# Patient Record
Sex: Female | Born: 1935
Health system: Southern US, Community
[De-identification: ages and names within clinical notes are randomized; demographics above are authoritative.]

## PROBLEM LIST (undated history)

## (undated) DIAGNOSIS — K589 Irritable bowel syndrome without diarrhea: Secondary | ICD-10-CM

## (undated) DIAGNOSIS — E785 Hyperlipidemia, unspecified: Secondary | ICD-10-CM

## (undated) DIAGNOSIS — F419 Anxiety disorder, unspecified: Secondary | ICD-10-CM

## (undated) HISTORY — PX: CHOLECYSTECTOMY: SHX55

## (undated) HISTORY — PX: ABDOMINAL HYSTERECTOMY: SHX81

## (undated) HISTORY — PX: APPENDECTOMY: SHX54

## (undated) HISTORY — DX: Anxiety disorder, unspecified: F41.9

## (undated) HISTORY — DX: Irritable bowel syndrome, unspecified: K58.9

## (undated) HISTORY — DX: Hyperlipidemia, unspecified: E78.5

---

## 2011-06-13 ENCOUNTER — Ambulatory Visit: Payer: Self-pay | Admitting: Family Medicine

## 2012-04-19 ENCOUNTER — Emergency Department: Payer: Self-pay | Admitting: Emergency Medicine

## 2012-04-19 LAB — URINALYSIS, COMPLETE
Bilirubin,UR: NEGATIVE
Ketone: NEGATIVE
Nitrite: NEGATIVE
Ph: 6 (ref 4.5–8.0)
Protein: NEGATIVE
RBC,UR: 3 /HPF (ref 0–5)
Specific Gravity: 1.006 (ref 1.003–1.030)
Squamous Epithelial: NONE SEEN
WBC UR: <1 /HPF (ref 0–5)

## 2012-04-19 LAB — CBC
HCT: 41.9 % (ref 35.0–47.0)
HGB: 14.3 g/dL (ref 12.0–16.0)
MCH: 29.6 pg (ref 26.0–34.0)
Platelet: 144 10*3/uL — ABNORMAL LOW (ref 150–440)
RBC: 4.82 10*6/uL (ref 3.80–5.20)
RDW: 14 % (ref 11.5–14.5)

## 2012-04-19 LAB — COMPREHENSIVE METABOLIC PANEL
Albumin: 4.4 g/dL (ref 3.4–5.0)
Alkaline Phosphatase: 92 U/L (ref 50–136)
Anion Gap: 9 (ref 7–16)
Bilirubin,Total: 1 mg/dL (ref 0.2–1.0)
Calcium, Total: 9.5 mg/dL (ref 8.5–10.1)
Chloride: 107 mmol/L (ref 98–107)
Creatinine: 0.91 mg/dL (ref 0.60–1.30)
EGFR (Non-African Amer.): >60
SGPT (ALT): 46 U/L
Sodium: 143 mmol/L (ref 136–145)

## 2012-04-19 LAB — CK TOTAL AND CKMB (NOT AT ARMC): CK, Total: 54 U/L (ref 21–215)

## 2012-04-19 LAB — TROPONIN I: Troponin-I: <0.02 ng/mL

## 2012-05-17 ENCOUNTER — Ambulatory Visit: Payer: Self-pay | Admitting: Otolaryngology

## 2012-07-22 DIAGNOSIS — R42 Dizziness and giddiness: Secondary | ICD-10-CM | POA: Insufficient documentation

## 2012-07-22 DIAGNOSIS — K589 Irritable bowel syndrome without diarrhea: Secondary | ICD-10-CM | POA: Insufficient documentation

## 2012-07-22 DIAGNOSIS — F339 Major depressive disorder, recurrent, unspecified: Secondary | ICD-10-CM | POA: Insufficient documentation

## 2012-07-22 DIAGNOSIS — F32A Depression, unspecified: Secondary | ICD-10-CM | POA: Insufficient documentation

## 2012-07-22 DIAGNOSIS — E785 Hyperlipidemia, unspecified: Secondary | ICD-10-CM | POA: Insufficient documentation

## 2012-07-22 DIAGNOSIS — F419 Anxiety disorder, unspecified: Secondary | ICD-10-CM | POA: Insufficient documentation

## 2012-08-13 ENCOUNTER — Ambulatory Visit: Payer: Self-pay | Admitting: Family Medicine

## 2013-11-28 IMAGING — US US CAROTID DUPLEX BILAT
1 series · 14 of 24 positions shown · non-contrast
Comparison: none

REASON FOR EXAM: dizziness
COMMENTS:

[Series 1: us carotid duplex bilat · 14 of 57 slices shown]
[im 1/57]
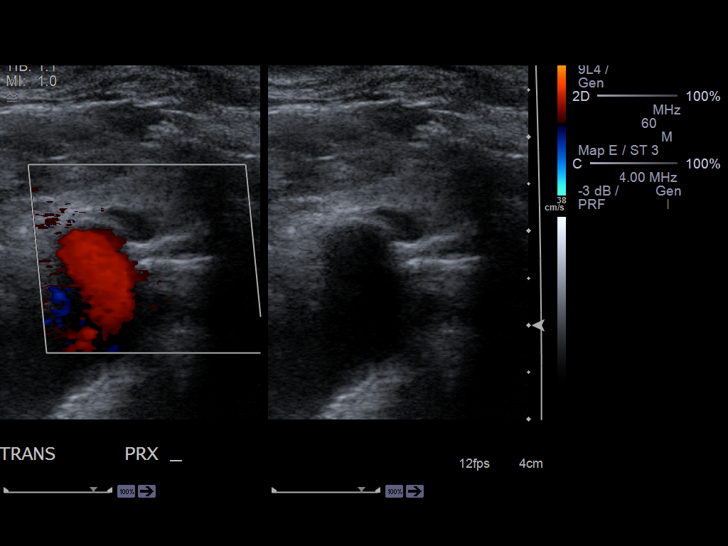
[im 5/57]
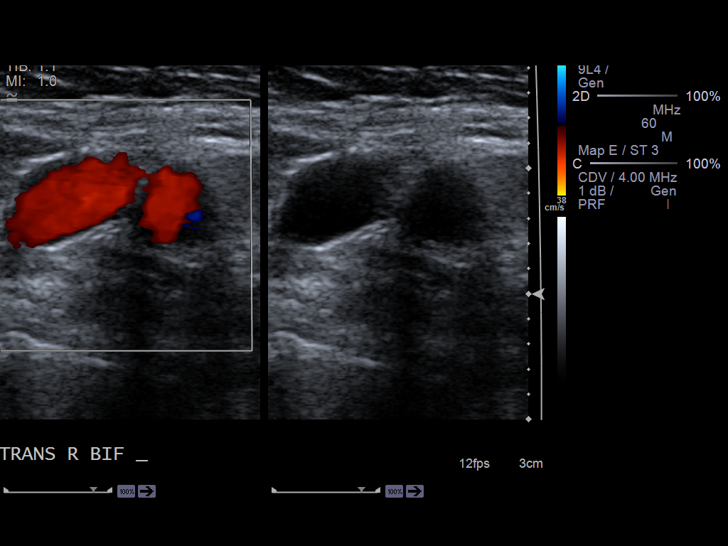
[im 10/57]
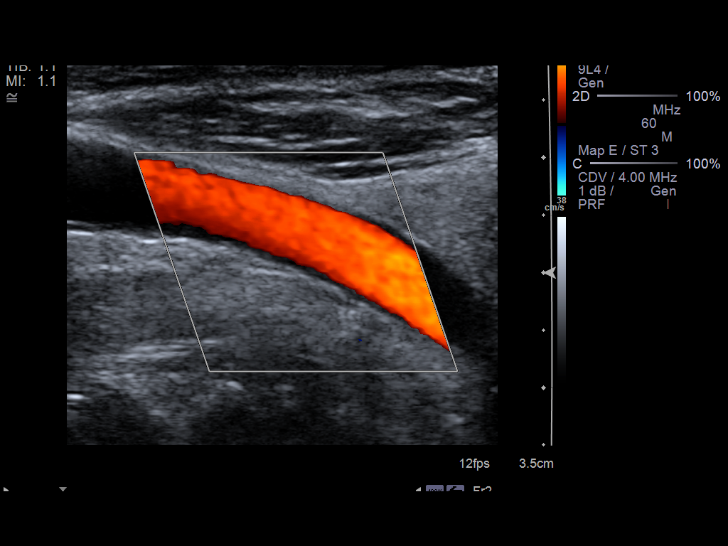
[im 15/57]
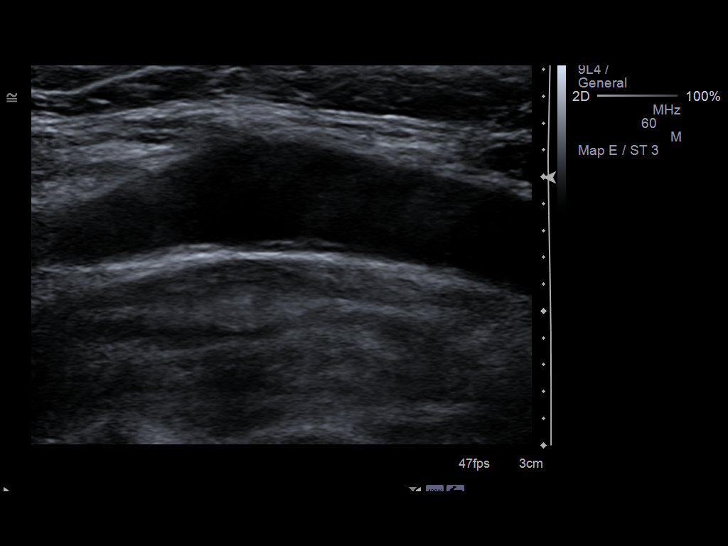
[im 18/57]
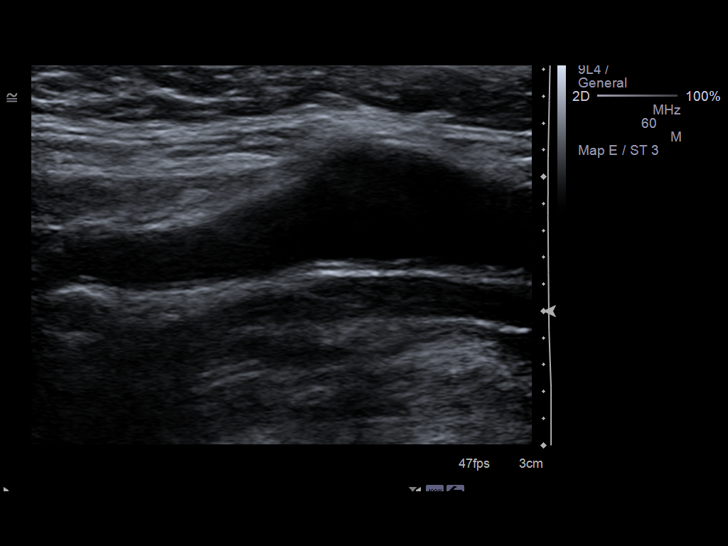
[im 22/57]
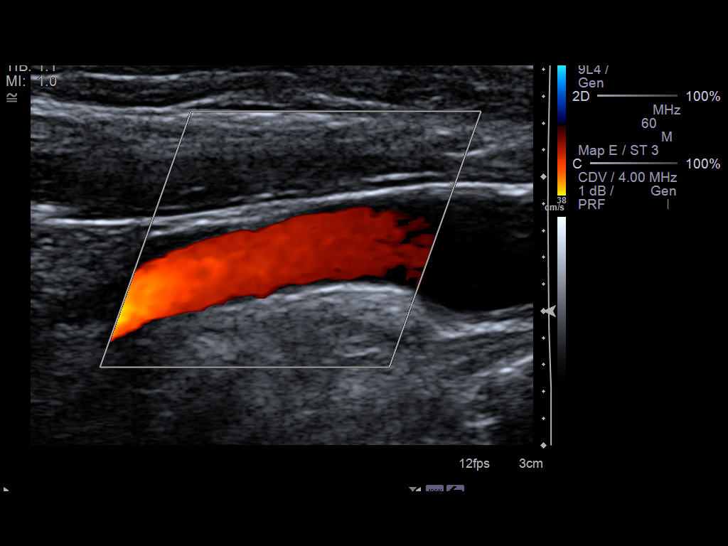
[im 27/57]
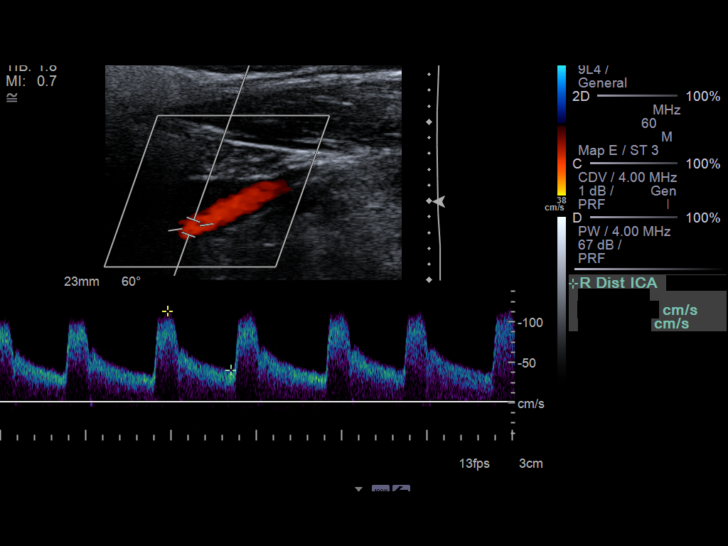
[im 30/57]
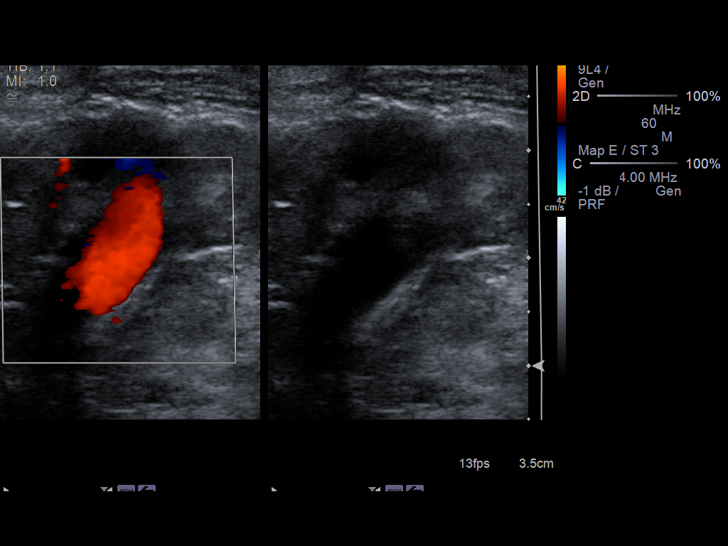
[im 35/57]
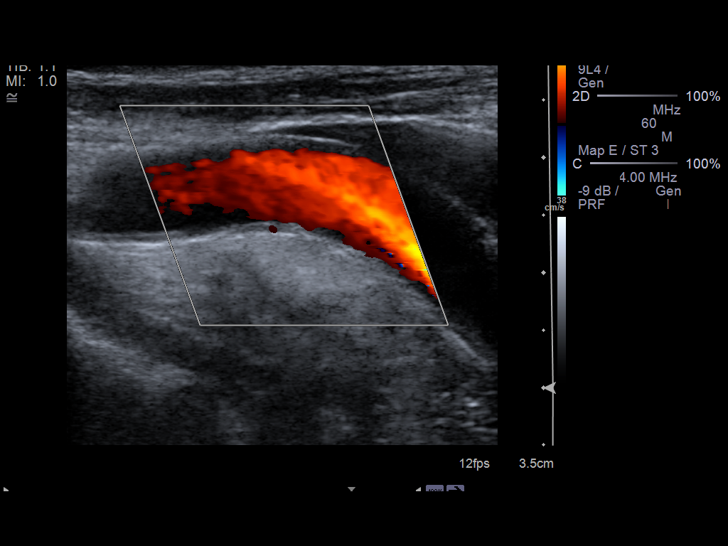
[im 39/57]
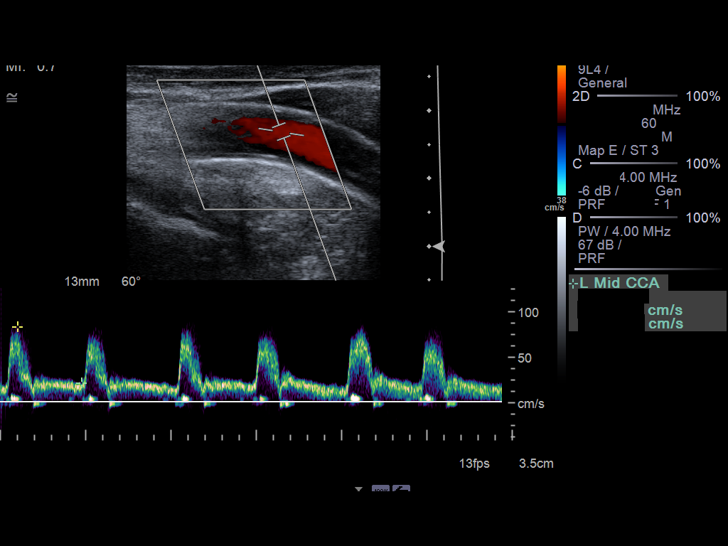
[im 44/57]
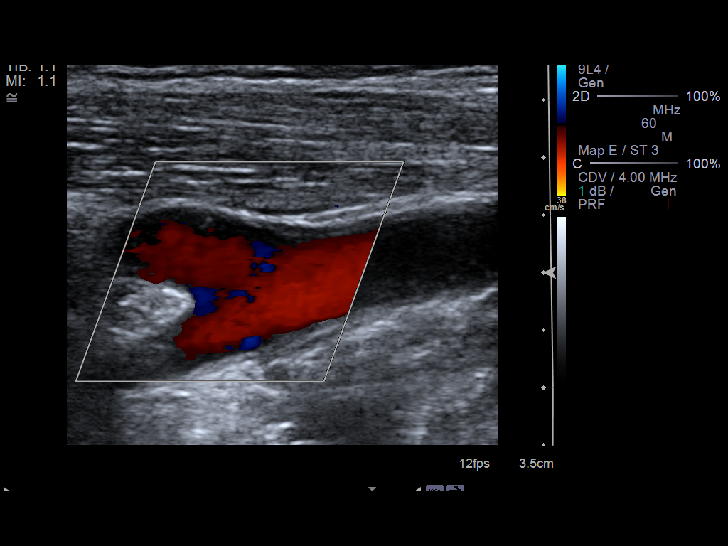
[im 47/57]
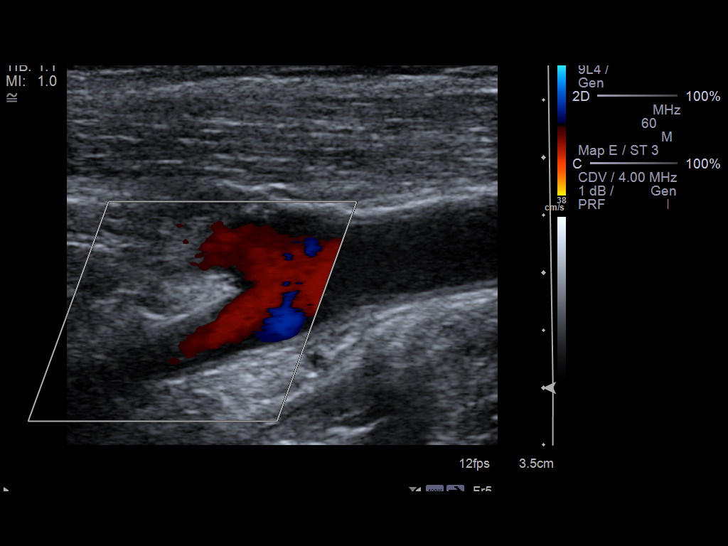
[im 52/57]
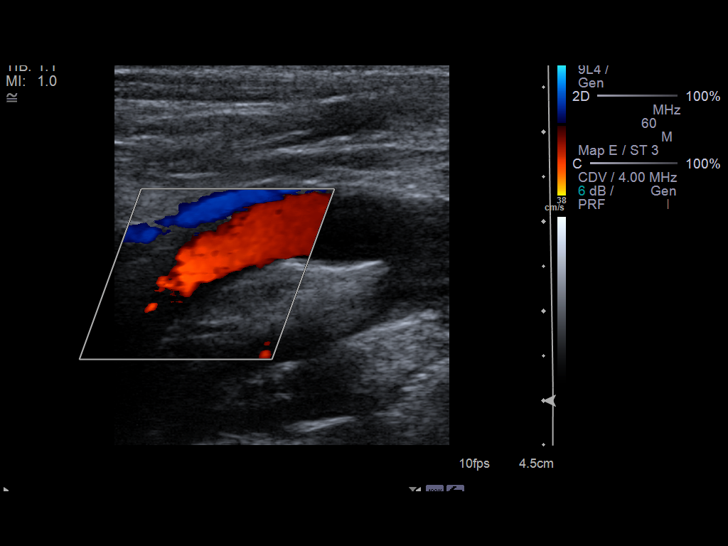
[im 57/57]
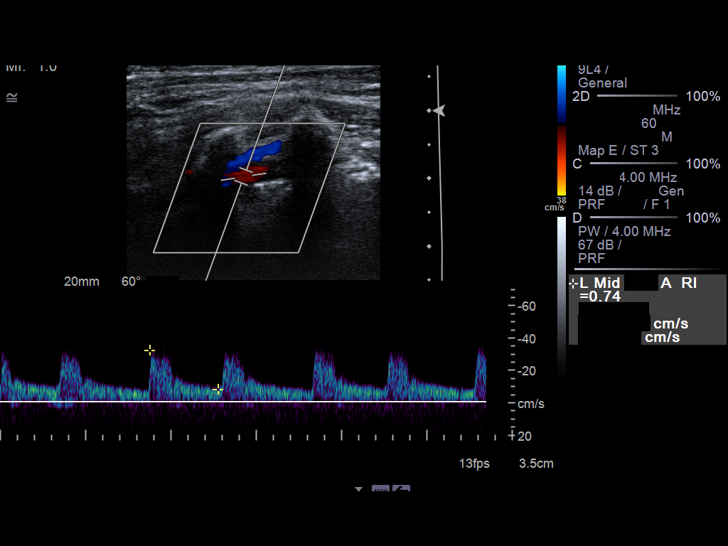

[14 of 24 positions shown; findings below may reference images not displayed]

PROCEDURE:     US  - US CAROTID DOPPLER BILATERAL  - May 17, 2012 [DATE]

RESULT:     No definite plaque formation is seen on either side.

On the right, the peak right common carotid artery flow velocity measures
92.1 c/sec and the peak right internal carotid artery flow velocity measures
113.3 c/sec. The ICA/CCA ratio is 1.23.

On the left, the peak left common carotid artery flow velocity measures
c/sec and the peak left internal carotid artery flow velocity also measures
99.2 c/sec. The ICA/CCA ratio is 1.

These values are compatible with the bilateral absence of hemodynamically
significant stenosis.

There is observed antegrade flow in both vertebrals.
IMPRESSION: 1.  Normal study. No plaque formation or stenosis is identified on either
side.
2.  There is antegrade flow in both vertebrals.

[REDACTED]

## 2014-12-20 DIAGNOSIS — K279 Peptic ulcer, site unspecified, unspecified as acute or chronic, without hemorrhage or perforation: Secondary | ICD-10-CM | POA: Diagnosis not present

## 2014-12-20 DIAGNOSIS — N289 Disorder of kidney and ureter, unspecified: Secondary | ICD-10-CM | POA: Diagnosis not present

## 2014-12-20 DIAGNOSIS — K219 Gastro-esophageal reflux disease without esophagitis: Secondary | ICD-10-CM | POA: Diagnosis not present

## 2014-12-20 DIAGNOSIS — F329 Major depressive disorder, single episode, unspecified: Secondary | ICD-10-CM | POA: Diagnosis not present

## 2014-12-20 DIAGNOSIS — E780 Pure hypercholesterolemia: Secondary | ICD-10-CM | POA: Diagnosis not present

## 2015-03-20 DIAGNOSIS — K219 Gastro-esophageal reflux disease without esophagitis: Secondary | ICD-10-CM | POA: Diagnosis not present

## 2015-03-20 DIAGNOSIS — N3281 Overactive bladder: Secondary | ICD-10-CM | POA: Diagnosis not present

## 2015-03-20 DIAGNOSIS — E780 Pure hypercholesterolemia: Secondary | ICD-10-CM | POA: Diagnosis not present

## 2015-03-20 DIAGNOSIS — K279 Peptic ulcer, site unspecified, unspecified as acute or chronic, without hemorrhage or perforation: Secondary | ICD-10-CM | POA: Diagnosis not present

## 2015-03-20 DIAGNOSIS — F329 Major depressive disorder, single episode, unspecified: Secondary | ICD-10-CM | POA: Diagnosis not present

## 2015-05-11 DIAGNOSIS — F329 Major depressive disorder, single episode, unspecified: Secondary | ICD-10-CM | POA: Diagnosis not present

## 2015-05-11 DIAGNOSIS — E780 Pure hypercholesterolemia: Secondary | ICD-10-CM | POA: Diagnosis not present

## 2015-05-11 DIAGNOSIS — F5104 Psychophysiologic insomnia: Secondary | ICD-10-CM | POA: Diagnosis not present

## 2015-05-11 DIAGNOSIS — G5791 Unspecified mononeuropathy of right lower limb: Secondary | ICD-10-CM | POA: Diagnosis not present

## 2015-05-11 DIAGNOSIS — G5792 Unspecified mononeuropathy of left lower limb: Secondary | ICD-10-CM | POA: Diagnosis not present

## 2015-05-21 ENCOUNTER — Other Ambulatory Visit: Payer: Self-pay | Admitting: Family Medicine

## 2015-05-21 DIAGNOSIS — K589 Irritable bowel syndrome without diarrhea: Secondary | ICD-10-CM

## 2015-05-21 MED ORDER — LOPERAMIDE HCL 2 MG PO TABS: 2.0000 mg | ORAL_TABLET | Freq: Three times a day (TID) | ORAL | Status: DC | PRN

## 2015-05-29 ENCOUNTER — Telehealth: Payer: Self-pay | Admitting: Family Medicine

## 2015-05-29 NOTE — Telephone Encounter (Signed)
Marsa Aris called to ask if the LOPERAMIDE 10 MG that was prescribed as tablet can be processed as capsule form.  Ref # [id number]

## 2015-05-29 NOTE — Telephone Encounter (Signed)
Can you research if I need to send this or not.[facility]

## 2015-05-29 NOTE — Telephone Encounter (Signed)
I am not aware that this is available by capsule.  If it is found that it is (she may ask pharmacy), then she may have i that way.

## 2015-05-30 NOTE — Telephone Encounter (Signed)
Advised on detailed message to call and have pharmacist look into changing form from Tab to Cap. [facility]

## 2015-05-31 DIAGNOSIS — K589 Irritable bowel syndrome without diarrhea: Secondary | ICD-10-CM

## 2015-05-31 MED ORDER — LOPERAMIDE HCL 2 MG PO TABS: 2.0000 mg | ORAL_TABLET | Freq: Three times a day (TID) | ORAL | Status: DC | PRN

## 2015-05-31 NOTE — Telephone Encounter (Signed)
This encounter was created in error - please disregard.

## 2015-07-17 ENCOUNTER — Other Ambulatory Visit: Payer: Self-pay | Admitting: Family Medicine

## 2015-07-19 ENCOUNTER — Ambulatory Visit: Payer: Self-pay | Admitting: Family Medicine

## 2015-07-23 ENCOUNTER — Telehealth: Payer: Self-pay | Admitting: Family Medicine

## 2015-07-23 NOTE — Telephone Encounter (Signed)
Pt called regarding her anxiety attack and taken clonazepam at 3:45 pm wanted to know when she can take next one and what to do with her Anxiety attack if she can't take another pill now and as per Dr. Juanetta Gosling advised pt that she can have next one in 6-8 hours and schedule appointment with Dr. Juanetta Gosling sooner but if she is having really bad anxiety attack then she needed to go to ER explain pt and she understand well and her plan is to go to ER.

## 2015-07-23 NOTE — Telephone Encounter (Signed)
Agreed-jh

## 2015-07-27 ENCOUNTER — Encounter: Payer: Self-pay | Admitting: Family Medicine

## 2015-07-27 ENCOUNTER — Ambulatory Visit (INDEPENDENT_AMBULATORY_CARE_PROVIDER_SITE_OTHER): Payer: Medicare Other | Admitting: Family Medicine

## 2015-07-27 VITALS — BP 131/78 | HR 78 | Temp 98.4°F | Resp 16 | Ht 60.0 in | Wt 256.4 lb

## 2015-07-27 DIAGNOSIS — R42 Dizziness and giddiness: Secondary | ICD-10-CM

## 2015-07-27 DIAGNOSIS — F419 Anxiety disorder, unspecified: Secondary | ICD-10-CM | POA: Diagnosis not present

## 2015-07-27 DIAGNOSIS — N183 Chronic kidney disease, stage 3 unspecified: Secondary | ICD-10-CM | POA: Insufficient documentation

## 2015-07-27 DIAGNOSIS — F329 Major depressive disorder, single episode, unspecified: Secondary | ICD-10-CM

## 2015-07-27 DIAGNOSIS — N289 Disorder of kidney and ureter, unspecified: Secondary | ICD-10-CM | POA: Diagnosis not present

## 2015-07-27 DIAGNOSIS — E785 Hyperlipidemia, unspecified: Secondary | ICD-10-CM | POA: Diagnosis not present

## 2015-07-27 DIAGNOSIS — F32A Depression, unspecified: Secondary | ICD-10-CM

## 2015-07-27 MED ORDER — DULOXETINE HCL 60 MG PO CPEP: 60.0000 mg | ORAL_CAPSULE | Freq: Every day | ORAL | Status: DC

## 2015-07-27 NOTE — Patient Instructions (Signed)
Change dose of Cymbalta.  Rest of meds are continued

## 2015-07-27 NOTE — Progress Notes (Signed)
Name: Paige House   MRN: [id number]    DOB: 28-Oct-1934   Date:07/27/2015       Progress Note  Subjective  Chief Complaint  Chief Complaint  Patient presents with  . Anxiety  . Depression    HPI  For f/u of Depression and anxiety.  Getting worse again.  Home situation not any better.  Alsao worried about her kidneys. No problem-specific assessment & plan notes found for this encounter.   History reviewed. No pertinent past medical history.  Social History  Substance Use Topics  . Smoking status: Former Smoker    Start date: 07/26/1985  . Smokeless tobacco: Never Used  . Alcohol Use: No     Current outpatient prescriptions:  .  clonazePAM (KLONOPIN) 0.5 MG tablet, , Disp: , Rfl: 0 .  diphenoxylate-atropine (LOMOTIL) 2.5-0.025 MG per tablet, 1-2 tablets daily as needed for Irritable Bowel Symptoms, Disp: , Rfl:  .  gabapentin (NEURONTIN) 300 MG capsule, Take 2 mg by mouth at bedtime. , Disp: , Rfl: 1 .  lisinopril (PRINIVIL,ZESTRIL) 5 MG tablet, , Disp: , Rfl: 0 .  loperamide (IMODIUM) 2 MG capsule, , Disp: , Rfl: 0 .  meclizine (ANTIVERT) 25 MG tablet, take 1 tablet by mouth three times a day if needed, Disp: 90 tablet, Rfl: 1 .  oxybutynin (DITROPAN) 5 MG tablet, , Disp: , Rfl:  .  pravastatin (PRAVACHOL) 40 MG tablet, Take by mouth., Disp: , Rfl:  .  triamcinolone cream (KENALOG) 0.5 %, Apply topically., Disp: , Rfl:  .  DULoxetine (CYMBALTA) 60 MG capsule, Take 1 capsule (60 mg total) by mouth daily., Disp: 30 capsule, Rfl: 6  No Known Allergies  Review of Systems  Constitutional: Positive for malaise/fatigue. Negative for fever, chills and weight loss.  HENT: Negative for hearing loss.   Eyes: Negative for blurred vision and double vision.  Respiratory: Negative for cough, sputum production, shortness of breath and wheezing.   Cardiovascular: Negative for chest pain, palpitations, orthopnea and leg swelling.  Gastrointestinal: Negative for heartburn, nausea,  vomiting, abdominal pain, diarrhea and blood in stool.  Genitourinary: Negative for dysuria, urgency and frequency.  Skin: Negative for rash.  Neurological: Negative for dizziness, tingling, tremors and headaches.  Psychiatric/Behavioral: Positive for depression. The patient is nervous/anxious.       Objective  Filed Vitals:   07/27/15 1324  BP: 131/78  Pulse: 78  Temp: 98.4 F (36.9 C)  Resp: 16  Height: 5' (1.524 m)  Weight: 256 lb 6.4 oz (116.302 kg)     Physical Exam  Constitutional: She is well-developed, well-nourished, and in no distress. No distress.  HENT:  Head: Normocephalic and atraumatic.  Neck: Normal range of motion. Neck supple. Carotid bruit is not present. No thyromegaly present.  Cardiovascular: Normal rate, regular rhythm, normal heart sounds and intact distal pulses.  Exam reveals no gallop and no friction rub.   No murmur heard. Pulmonary/Chest: Effort normal and breath sounds normal. No respiratory distress. She has no wheezes. She has no rales.  Abdominal: Soft. Bowel sounds are normal. She exhibits no distension and no mass. There is no tenderness.  Musculoskeletal: She exhibits no edema.  Lymphadenopathy:    She has no cervical adenopathy.  Psychiatric:  Very anxious with depressed affect.  PHQ-9 score of 19.  Vitals reviewed.     No results found for this or any previous visit (from the past 2160 hour(s)).   Assessment & Plan  1. Depression  - DULoxetine (CYMBALTA) 60  MG capsule; Take 1 capsule (60 mg total) by mouth daily.  Dispense: 30 capsule; Refill: 6  2. Acute anxiety  - DULoxetine (CYMBALTA) 60 MG capsule; Take 1 capsule (60 mg total) by mouth daily.  Dispense: 30 capsule; Refill: 6  3. Dizziness   4. HLD (hyperlipidemia)  - Lipid Profile  5. Renal insufficiency  - Comprehensive Metabolic Panel (CMET) - CBC with Differential

## 2015-08-16 DIAGNOSIS — H2513 Age-related nuclear cataract, bilateral: Secondary | ICD-10-CM | POA: Diagnosis not present

## 2015-08-24 DIAGNOSIS — N289 Disorder of kidney and ureter, unspecified: Secondary | ICD-10-CM | POA: Diagnosis not present

## 2015-08-24 DIAGNOSIS — E785 Hyperlipidemia, unspecified: Secondary | ICD-10-CM | POA: Diagnosis not present

## 2015-08-25 LAB — CBC WITH DIFFERENTIAL/PLATELET
BASOS ABS: 0.1 10*3/uL (ref 0.0–0.2)
Basos: 1 %
EOS (ABSOLUTE): 0.2 10*3/uL (ref 0.0–0.4)
EOS: 3 %
HEMATOCRIT: 42.2 % (ref 34.0–46.6)
Hemoglobin: 14 g/dL (ref 11.1–15.9)
IMMATURE GRANS (ABS): 0 10*3/uL (ref 0.0–0.1)
IMMATURE GRANULOCYTES: 0 %
LYMPHS: 31 %
Lymphocytes Absolute: 2.1 10*3/uL (ref 0.7–3.1)
MCH: 28.9 pg (ref 26.6–33.0)
MCHC: 33.2 g/dL (ref 31.5–35.7)
MCV: 87 fL (ref 79–97)
Monocytes Absolute: 0.4 10*3/uL (ref 0.1–0.9)
Monocytes: 6 %
NEUTROS PCT: 59 %
Neutrophils Absolute: 3.9 10*3/uL (ref 1.4–7.0)
Platelets: 211 10*3/uL (ref 150–379)
RBC: 4.84 x10E6/uL (ref 3.77–5.28)
RDW: 14.4 % (ref 12.3–15.4)
WBC: 6.7 10*3/uL (ref 3.4–10.8)

## 2015-08-25 LAB — COMPREHENSIVE METABOLIC PANEL
ALK PHOS: 67 IU/L (ref 39–117)
ALT: 14 IU/L (ref 0–32)
AST: 19 IU/L (ref 0–40)
Albumin/Globulin Ratio: 2 (ref 1.1–2.5)
Albumin: 4.2 g/dL (ref 3.5–4.8)
BILIRUBIN TOTAL: 1.2 mg/dL (ref 0.0–1.2)
BUN / CREAT RATIO: 14 (ref 11–26)
BUN: 15 mg/dL (ref 8–27)
CHLORIDE: 103 mmol/L (ref 97–108)
CO2: 24 mmol/L (ref 18–29)
Calcium: 9.1 mg/dL (ref 8.7–10.3)
Creatinine, Ser: 1.06 mg/dL — ABNORMAL HIGH (ref 0.57–1.00)
GFR calc Af Amer: 58 mL/min/{1.73_m2} — ABNORMAL LOW (ref >59)
GFR calc non Af Amer: 50 mL/min/{1.73_m2} — ABNORMAL LOW (ref >59)
Globulin, Total: 2.1 g/dL (ref 1.5–4.5)
Glucose: 99 mg/dL (ref 65–99)
Potassium: 4.2 mmol/L (ref 3.5–5.2)
Sodium: 142 mmol/L (ref 134–144)
Total Protein: 6.3 g/dL (ref 6.0–8.5)

## 2015-08-25 LAB — LIPID PANEL
CHOL/HDL RATIO: 4.4 ratio (ref 0.0–4.4)
Cholesterol, Total: 244 mg/dL — ABNORMAL HIGH (ref 100–199)
HDL: 56 mg/dL (ref >39)
LDL Calculated: 163 mg/dL — ABNORMAL HIGH (ref 0–99)
Triglycerides: 127 mg/dL (ref 0–149)
VLDL CHOLESTEROL CAL: 25 mg/dL (ref 5–40)

## 2015-08-27 ENCOUNTER — Telehealth: Payer: Self-pay | Admitting: Family Medicine

## 2015-08-27 MED ORDER — PRAVASTATIN SODIUM 80 MG PO TABS: 80.0000 mg | ORAL_TABLET | Freq: Every day | ORAL | Status: DC

## 2015-08-27 NOTE — Telephone Encounter (Signed)
The Lisinopril actually helps her kidneys.  She is on a low dose. But can take 1/2 of the 5 mg tab..  The drinking of water is for health reasons.  -jh

## 2015-08-27 NOTE — Telephone Encounter (Signed)
Pt is aware of lab results. She says that she has overactive bladder and is questioning cont. Lisinopril and increasing water intake. Please advise

## 2015-08-28 NOTE — Telephone Encounter (Signed)
Relayed [facility] message to the patient. Pt is aware that she is to cut Lisinopril in half and it is for kidney health.

## 2015-09-05 ENCOUNTER — Telehealth: Payer: Self-pay | Admitting: Family Medicine

## 2015-09-05 NOTE — Telephone Encounter (Signed)
Dr. Clydene Pugh did a referral to Dr. Gardenia Phlegm  Surgeon, Consultation 09-12-15, pt  Surgery 10-02-15

## 2015-09-12 DIAGNOSIS — H40013 Open angle with borderline findings, low risk, bilateral: Secondary | ICD-10-CM | POA: Diagnosis not present

## 2015-09-12 DIAGNOSIS — H2511 Age-related nuclear cataract, right eye: Secondary | ICD-10-CM | POA: Diagnosis not present

## 2015-09-12 DIAGNOSIS — H25011 Cortical age-related cataract, right eye: Secondary | ICD-10-CM | POA: Diagnosis not present

## 2015-09-12 DIAGNOSIS — H25012 Cortical age-related cataract, left eye: Secondary | ICD-10-CM | POA: Diagnosis not present

## 2015-09-12 DIAGNOSIS — H401413 Capsular glaucoma with pseudoexfoliation of lens, right eye, severe stage: Secondary | ICD-10-CM | POA: Diagnosis not present

## 2015-09-24 ENCOUNTER — Encounter: Payer: Self-pay | Admitting: Family Medicine

## 2015-09-25 ENCOUNTER — Encounter: Payer: Self-pay | Admitting: Family Medicine

## 2015-09-25 ENCOUNTER — Ambulatory Visit (INDEPENDENT_AMBULATORY_CARE_PROVIDER_SITE_OTHER): Payer: Medicare Other | Admitting: Family Medicine

## 2015-09-25 VITALS — BP 109/71 | HR 74 | Resp 16 | Ht 60.0 in | Wt 156.0 lb

## 2015-09-25 DIAGNOSIS — Z23 Encounter for immunization: Secondary | ICD-10-CM

## 2015-09-25 DIAGNOSIS — F329 Major depressive disorder, single episode, unspecified: Secondary | ICD-10-CM

## 2015-09-25 DIAGNOSIS — F32A Depression, unspecified: Secondary | ICD-10-CM

## 2015-09-25 DIAGNOSIS — B354 Tinea corporis: Secondary | ICD-10-CM

## 2015-09-25 MED ORDER — TERBINAFINE HCL 1 % EX CREA: 1.0000 "application " | TOPICAL_CREAM | Freq: Two times a day (BID) | CUTANEOUS | Status: DC

## 2015-09-25 NOTE — Progress Notes (Signed)
Name: Paige House   MRN: [id number]    DOB: Oct 10, 1934   Date:09/25/2015       Progress Note  Subjective  Chief Complaint  Chief Complaint  Patient presents with  . Depression    So much better   . Rash    Painful in breast area.     HPI  Here to f/u depression.  Home situation hs changed and she feels much better.  Feeling good on higher dose of Cymbalta,   Red irritated rash between breasts.  No problem-specific assessment & plan notes found for this encounter.   History reviewed. No pertinent past medical history.  Social History  Substance Use Topics  . Smoking status: Former Smoker    Start date: 07/26/1985  . Smokeless tobacco: Never Used  . Alcohol Use: No     Current outpatient prescriptions:  .  diphenoxylate-atropine (LOMOTIL) 2.5-0.025 MG per tablet, 1-2 tablets daily as needed for Irritable Bowel Symptoms, Disp: , Rfl:  .  DULoxetine (CYMBALTA) 60 MG capsule, Take 1 capsule (60 mg total) by mouth daily., Disp: 30 capsule, Rfl: 6 .  gabapentin (NEURONTIN) 300 MG capsule, Take 1 mg by mouth at bedtime. , Disp: , Rfl: 1 .  lisinopril (PRINIVIL,ZESTRIL) 5 MG tablet, , Disp: , Rfl: 0 .  loperamide (IMODIUM) 2 MG capsule, , Disp: , Rfl: 0 .  meclizine (ANTIVERT) 25 MG tablet, take 1 tablet by mouth three times a day if needed, Disp: 90 tablet, Rfl: 1 .  Multiple Vitamins-Minerals (ONE-A-DAY WOMENS 50 PLUS PO), Take by mouth., Disp: , Rfl:  .  oxybutynin (DITROPAN) 5 MG tablet, , Disp: , Rfl:  .  pravastatin (PRAVACHOL) 40 MG tablet, Take by mouth., Disp: , Rfl:  .  triamcinolone cream (KENALOG) 0.5 %, Apply topically., Disp: , Rfl:   No Known Allergies  Review of Systems  Constitutional: Negative for fever, chills, weight loss and malaise/fatigue.  HENT: Negative for hearing loss.   Eyes: Negative for blurred vision and double vision.  Respiratory: Negative for cough, sputum production, shortness of breath and wheezing.   Cardiovascular: Negative for  chest pain, palpitations, orthopnea and leg swelling.  Gastrointestinal: Negative for heartburn, abdominal pain and blood in stool.  Genitourinary: Negative for dysuria, urgency and frequency.  Musculoskeletal: Negative for myalgias and falls.  Skin: Positive for rash.  Neurological: Negative for dizziness, weakness and headaches.  Psychiatric/Behavioral: Negative for depression. The patient is not nervous/anxious and does not have insomnia.       Objective  Filed Vitals:   09/25/15 1043  BP: 109/71  Pulse: 74  Resp: 16  Height: 5' (1.524 m)  Weight: 156 lb (70.761 kg)     Physical Exam  Constitutional: She is oriented to person, place, and time and well-developed, well-nourished, and in no distress. No distress.  HENT:  Head: Normocephalic and atraumatic.  Eyes: Conjunctivae and EOM are normal. Pupils are equal, round, and reactive to light. No scleral icterus.  Neck: Normal range of motion. Neck supple. Carotid bruit is not present. No thyromegaly present.  Cardiovascular: Normal rate, regular rhythm, normal heart sounds and intact distal pulses.  Exam reveals no gallop and no friction rub.   No murmur heard. Pulmonary/Chest: Effort normal and breath sounds normal. No respiratory distress. She has no wheezes. She has no rales.  Musculoskeletal: She exhibits no edema.  Lymphadenopathy:    She has no cervical adenopathy.  Neurological: She is alert and oriented to person, place, and time.  Skin:  Rash noted. There is erythema.  Red , irritated rash with cracked skin between breasts.  Psychiatric: Mood, memory, affect and judgment normal.  Feels 9/10 at this time.  Vitals reviewed.     Recent Results (from the past 2160 hour(s))  Lipid Profile     Status: Abnormal   Collection Time: 08/24/15  1:04 PM  Result Value Ref Range   Cholesterol, Total 244 (H) 100 - 199 mg/dL   Triglycerides 960 0 - 149 mg/dL   HDL 56 >45 mg/dL    Comment: According to ATP-III Guidelines,  HDL-C >59 mg/dL is considered a negative risk factor for CHD.    VLDL Cholesterol Cal 25 5 - 40 mg/dL   LDL Calculated 409 (H) 0 - 99 mg/dL   Chol/HDL Ratio 4.4 0.0 - 4.4 ratio units    Comment:                                   T. Chol/HDL Ratio                                             Men  Women                               1/2 Avg.Risk  3.4    3.3                                   Avg.Risk  5.0    4.4                                2X Avg.Risk  9.6    7.1                                3X Avg.Risk 23.4   11.0   Comprehensive Metabolic Panel (CMET)     Status: Abnormal   Collection Time: 08/24/15  1:04 PM  Result Value Ref Range   Glucose 99 65 - 99 mg/dL   BUN 15 8 - 27 mg/dL   Creatinine, Ser 8.11 (H) 0.57 - 1.00 mg/dL   GFR calc non Af Amer 50 (L) >59 mL/min/1.73   GFR calc Af Amer 58 (L) >59 mL/min/1.73   BUN/Creatinine Ratio 14 11 - 26   Sodium 142 134 - 144 mmol/L   Potassium 4.2 3.5 - 5.2 mmol/L   Chloride 103 97 - 108 mmol/L   CO2 24 18 - 29 mmol/L   Calcium 9.1 8.7 - 10.3 mg/dL   Total Protein 6.3 6.0 - 8.5 g/dL   Albumin 4.2 3.5 - 4.8 g/dL   Globulin, Total 2.1 1.5 - 4.5 g/dL   Albumin/Globulin Ratio 2.0 1.1 - 2.5   Bilirubin Total 1.2 0.0 - 1.2 mg/dL   Alkaline Phosphatase 67 39 - 117 IU/L   AST 19 0 - 40 IU/L   ALT 14 0 - 32 IU/L  CBC with Differential     Status: None   Collection Time: 08/24/15  1:04 PM  Result Value Ref Range   WBC 6.7 3.4 -  10.8 x10E3/uL   RBC 4.84 3.77 - 5.28 x10E6/uL   Hemoglobin 14.0 11.1 - 15.9 g/dL   Hematocrit 09.8 11.9 - 46.6 %   MCV 87 79 - 97 fL   MCH 28.9 26.6 - 33.0 pg   MCHC 33.2 31.5 - 35.7 g/dL   RDW 14.7 82.9 - 56.2 %   Platelets 211 150 - 379 x10E3/uL   Neutrophils 59 %   Lymphs 31 %   Monocytes 6 %   Eos 3 %   Basos 1 %   Neutrophils Absolute 3.9 1.4 - 7.0 x10E3/uL   Lymphocytes Absolute 2.1 0.7 - 3.1 x10E3/uL   Monocytes Absolute 0.4 0.1 - 0.9 x10E3/uL   EOS (ABSOLUTE) 0.2 0.0 - 0.4 x10E3/uL   Basophils  Absolute 0.1 0.0 - 0.2 x10E3/uL   Immature Granulocytes 0 %   Immature Grans (Abs) 0.0 0.0 - 0.1 x10E3/uL     Assessment & Plan  1. Need for influenza vaccination  - Flu vaccine HIGH DOSE PF (Fluzone High dose)  2. Tinea corporis  - terbinafine (LAMISIL AT) 1 % cream; Apply 1 application topically 2 (two) times daily.  Dispense: 30 g; Refill: 0  3. Depression -cont. Cymbalta

## 2015-10-22 ENCOUNTER — Telehealth: Payer: Self-pay | Admitting: Family Medicine

## 2015-10-22 NOTE — Telephone Encounter (Signed)
Please advise 

## 2015-10-22 NOTE — Telephone Encounter (Signed)
Call back and ask if she is taking or has any Lomotil at home since Imodium not working.  If not any Lomotil, we can send some in. Let me know.-jh

## 2015-10-22 NOTE — Telephone Encounter (Signed)
Pt said her IBS has been totally out of control the past 4 or 5 days.  She takes loperamide 2 mg and it worked great for a while but is not working now.  Please call [phone]

## 2015-10-22 NOTE — Telephone Encounter (Signed)
Patient was taking Lomotil initially then she was changed to Loperamide. She says it worked for a while but she now is not.

## 2015-10-22 NOTE — Telephone Encounter (Signed)
Send in Rx for Lomotil, 2 tabs up to 4 times a day when having diarrhea, then 2 tabs once daily to control symptoms.  #60/3 refills>  .,. Call if that doesn't work.  May need to see her for other meds to try or refer to GI for their help.-jh

## 2015-10-23 MED ORDER — DIPHENOXYLATE-ATROPINE 2.5-0.025 MG PO TABS: 2.0000 | ORAL_TABLET | Freq: Four times a day (QID) | ORAL | Status: DC | PRN

## 2015-10-23 NOTE — Telephone Encounter (Signed)
Patient aware Lomotil sent to pharmacy.

## 2015-12-31 ENCOUNTER — Ambulatory Visit (INDEPENDENT_AMBULATORY_CARE_PROVIDER_SITE_OTHER): Payer: Medicare Other | Admitting: Family Medicine

## 2015-12-31 ENCOUNTER — Encounter: Payer: Self-pay | Admitting: Family Medicine

## 2015-12-31 VITALS — BP 124/84 | HR 87 | Resp 16 | Ht 60.0 in | Wt 156.2 lb

## 2015-12-31 DIAGNOSIS — M25562 Pain in left knee: Secondary | ICD-10-CM

## 2015-12-31 DIAGNOSIS — M25552 Pain in left hip: Secondary | ICD-10-CM

## 2015-12-31 DIAGNOSIS — K589 Irritable bowel syndrome without diarrhea: Secondary | ICD-10-CM

## 2015-12-31 DIAGNOSIS — K598 Other specified functional intestinal disorders: Secondary | ICD-10-CM

## 2015-12-31 DIAGNOSIS — M25561 Pain in right knee: Secondary | ICD-10-CM | POA: Diagnosis not present

## 2015-12-31 MED ORDER — DIPHENOXYLATE-ATROPINE 2.5-0.025 MG PO TABS: 2.0000 | ORAL_TABLET | Freq: Two times a day (BID) | ORAL | Status: DC

## 2015-12-31 MED ORDER — ACETAMINOPHEN 500 MG PO TABS: ORAL_TABLET | ORAL | Status: AC

## 2015-12-31 NOTE — Progress Notes (Signed)
Name: Paige House   MRN: [id number]    DOB: 09/12/1934   Date:12/31/2015       Progress Note  Subjective  Chief Complaint  Chief Complaint  Patient presents with  . Hip Pain    left  . Knee Pain    right    HPI Here for f/u of diarrhea.  Has been going on and off for > 1 year.  Also c/o today severe L hip and knee pain and mod. R knee pain.  No Ortho w/u.  Has some mild renal insufficiency.  Also ? Colitis.  No GI w/u.    No problem-specific assessment & plan notes found for this encounter.   History reviewed. No pertinent past medical history.  History reviewed. No pertinent past surgical history.  History reviewed. No pertinent family history.  Social History   Social History  . Marital Status: Widowed    Spouse Name: N/A  . Number of Children: N/A  . Years of Education: N/A   Occupational History  . Not on file.   Social History Main Topics  . Smoking status: Former Smoker    Start date: 07/26/1985  . Smokeless tobacco: Never Used  . Alcohol Use: No  . Drug Use: No  . Sexual Activity: Not on file   Other Topics Concern  . Not on file   Social History Narrative     Current outpatient prescriptions:  .  diphenoxylate-atropine (LOMOTIL) 2.5-0.025 MG tablet, Take 2 tablets by mouth 2 (two) times daily before a meal., Disp: 60 tablet, Rfl: 3 .  DULoxetine (CYMBALTA) 60 MG capsule, Take 1 capsule (60 mg total) by mouth daily., Disp: 30 capsule, Rfl: 6 .  gabapentin (NEURONTIN) 300 MG capsule, Take 300 mg by mouth at bedtime. Takes 2 at bedtiome, Disp: , Rfl: 1 .  lisinopril (PRINIVIL,ZESTRIL) 5 MG tablet, , Disp: , Rfl: 0 .  meclizine (ANTIVERT) 25 MG tablet, take 1 tablet by mouth three times a day if needed, Disp: 90 tablet, Rfl: 1 .  Multiple Vitamins-Minerals (ONE-A-DAY WOMENS 50 PLUS PO), Take by mouth., Disp: , Rfl:  .  oxybutynin (DITROPAN) 5 MG tablet, , Disp: , Rfl:  .  pravastatin (PRAVACHOL) 40 MG tablet, Take by mouth., Disp: , Rfl:  .   terbinafine (LAMISIL AT) 1 % cream, Apply 1 application topically 2 (two) times daily., Disp: 30 g, Rfl: 0 .  triamcinolone cream (KENALOG) 0.5 %, Apply topically., Disp: , Rfl:  .  acetaminophen (TYLENOL) 500 MG tablet, Take 2 tabs up to 3 times a day for arthritis pain, Disp: 100 tablet, Rfl: 12  Not on File   Review of Systems  Constitutional: Negative for fever, chills, weight loss and malaise/fatigue.  HENT: Negative for hearing loss.   Eyes: Negative for blurred vision and double vision.  Respiratory: Negative for cough, shortness of breath and wheezing.   Cardiovascular: Negative for chest pain, palpitations and leg swelling.  Gastrointestinal: Positive for diarrhea. Negative for heartburn, nausea, vomiting, abdominal pain, constipation and blood in stool.  Genitourinary: Negative for dysuria, urgency and frequency.  Musculoskeletal: Positive for joint pain. Falls: L hip and knee pain and R knee pain.  Skin: Negative for rash.  Neurological: Negative for dizziness, tremors, weakness and headaches.      Objective  Filed Vitals:   12/31/15 1101  BP: 124/84  Pulse: 87  Resp: 16  Height: 5' (1.524 m)  Weight: 156 lb 3.2 oz (70.852 kg)    Physical Exam  Constitutional: She is oriented to person, place, and time and well-developed, well-nourished, and in no distress. No distress.  HENT:  Head: Normocephalic and atraumatic.  Eyes: Conjunctivae and EOM are normal. Pupils are equal, round, and reactive to light.  Neck: Normal range of motion. Neck supple. Carotid bruit is not present. No thyromegaly present.  Cardiovascular: Normal rate and regular rhythm.  Exam reveals no gallop and no friction rub.   No murmur heard. Pulmonary/Chest: Effort normal and breath sounds normal. No respiratory distress. She has no wheezes. She has no rales.  Abdominal: Soft. Bowel sounds are normal. She exhibits no distension, no abdominal bruit and no mass. There is no tenderness.   Musculoskeletal: She exhibits no edema.  Pain with almost any motion of L hip and L knee.  Also pain to palpation over L hip and knee.  R knee with no pain with ROM and minimal pain to palpation  Lymphadenopathy:    She has no cervical adenopathy.  Neurological: She is alert and oriented to person, place, and time.  Vitals reviewed.      No results found for this or any previous visit (from the past 2160 hour(s)).   Assessment & Plan  Problem List Items Addressed This Visit      Digestive   Adaptive colitis - Primary   Relevant Medications   diphenoxylate-atropine (LOMOTIL) 2.5-0.025 MG tablet   Other Relevant Orders   Ambulatory referral to Gastroenterology     Other   Left hip pain   Relevant Medications   acetaminophen (TYLENOL) 500 MG tablet   Other Relevant Orders   Ambulatory referral to Orthopedic Surgery   Knee pain, bilateral   Relevant Medications   acetaminophen (TYLENOL) 500 MG tablet   Other Relevant Orders   Ambulatory referral to Orthopedic Surgery      Meds ordered this encounter  Medications  . diphenoxylate-atropine (LOMOTIL) 2.5-0.025 MG tablet    Sig: Take 2 tablets by mouth 2 (two) times daily before a meal.    Dispense:  60 tablet    Refill:  3  . acetaminophen (TYLENOL) 500 MG tablet    Sig: Take 2 tabs up to 3 times a day for arthritis pain    Dispense:  100 tablet    Refill:  12    OTC   1. Adaptive colitis  - diphenoxylate-atropine (LOMOTIL) 2.5-0.025 MG tablet; Take 2 tablets by mouth 2 (two) times daily before a meal.  Dispense: 60 tablet; Refill: 3 - Ambulatory referral to Gastroenterology  2. Left hip pain  - Ambulatory referral to Orthopedic Surgery - acetaminophen (TYLENOL) 500 MG tablet; Take 2 tabs up to 3 times a day for arthritis pain  Dispense: 100 tablet; Refill: 12  3. Knee pain, bilateral  - Ambulatory referral to Orthopedic Surgery - acetaminophen (TYLENOL) 500 MG tablet; Take 2 tabs up to 3 times a day for  arthritis pain  Dispense: 100 tablet; Refill: 12

## 2016-01-01 ENCOUNTER — Encounter: Payer: Self-pay | Admitting: Gastroenterology

## 2016-01-07 DIAGNOSIS — M1612 Unilateral primary osteoarthritis, left hip: Secondary | ICD-10-CM | POA: Diagnosis not present

## 2016-01-07 DIAGNOSIS — M169 Osteoarthritis of hip, unspecified: Secondary | ICD-10-CM | POA: Insufficient documentation

## 2016-01-07 DIAGNOSIS — M170 Bilateral primary osteoarthritis of knee: Secondary | ICD-10-CM | POA: Diagnosis not present

## 2016-01-07 DIAGNOSIS — M179 Osteoarthritis of knee, unspecified: Secondary | ICD-10-CM | POA: Insufficient documentation

## 2016-01-22 ENCOUNTER — Ambulatory Visit: Payer: Medicare Other | Admitting: Family Medicine

## 2016-02-04 ENCOUNTER — Other Ambulatory Visit: Payer: Self-pay

## 2016-02-05 ENCOUNTER — Encounter: Payer: Self-pay | Admitting: Gastroenterology

## 2016-02-05 ENCOUNTER — Ambulatory Visit (INDEPENDENT_AMBULATORY_CARE_PROVIDER_SITE_OTHER): Payer: Medicare Other | Admitting: Gastroenterology

## 2016-02-05 ENCOUNTER — Ambulatory Visit: Payer: Medicare Other | Admitting: Family Medicine

## 2016-02-05 VITALS — BP 126/82 | HR 98 | Temp 98.1°F | Ht 60.0 in | Wt 151.0 lb

## 2016-02-05 DIAGNOSIS — K580 Irritable bowel syndrome with diarrhea: Secondary | ICD-10-CM

## 2016-02-05 NOTE — Progress Notes (Signed)
Gastroenterology Consultation  Referring Provider:     Janeann Forehand., MD Primary Care Physician:  Fidel Levy, MD Primary Gastroenterologist:  Dr. Servando Snare     Reason for Consultation:     Diarrhea        HPI:   Paige House is a 82 y.o. y/o female referred for consultation & management of Diarrhea by Dr. Fidel Levy, MD.  This patient reports that she's had diarrhea for many years. The patient states that her diarrhea started shortly after losing her 11 year old son to a brain aneurysm. Patient states that she had for a couple years and it went away and was completely gone for some time but then started again. The patient states that the last 6 months have been worse. She also reports that she has lost some weight but is not able to quantify how much. The patient denies the diarrhea waking her up in the middle of night. The patient states that when she gets the diarrhea she usually takes 2 Lomotil's in a day and then is fine for that day and the next day but then the diarrhea will return the following day. There is no report of any black stools or bloody stools. The patient also reports that she has to be close to a bathroom if she is not taking her medication. The patient hasn't had a colonoscopy in some time. She denies any nausea or vomiting.  Past Medical History  Diagnosis Date  . Depression   . Anxiety   . Hyperlipidemia   . IBS (irritable bowel syndrome)     Past Surgical History  Procedure Laterality Date  . Cholecystectomy    . Appendectomy    . Abdominal hysterectomy      Prior to Admission medications   Medication Sig Start Date End Date Taking? Authorizing Provider  acetaminophen (TYLENOL) 500 MG tablet Take 2 tabs up to 3 times a day for arthritis pain 12/31/15  Yes Janeann Forehand., MD  diphenoxylate-atropine (LOMOTIL) 2.5-0.025 MG tablet Take 2 tablets by mouth 2 (two) times daily before a meal. 12/31/15  Yes Janeann Forehand., MD  DULoxetine  (CYMBALTA) 60 MG capsule Take 1 capsule (60 mg total) by mouth daily. 07/27/15  Yes Janeann Forehand., MD  gabapentin (NEURONTIN) 300 MG capsule Take 300 mg by mouth at bedtime. Takes 2 at bedtiome 03/16/15  Yes Historical Provider, MD  lisinopril (PRINIVIL,ZESTRIL) 5 MG tablet  03/10/15  Yes Historical Provider, MD  meclizine (ANTIVERT) 25 MG tablet take 1 tablet by mouth three times a day if needed 07/17/15  Yes Janeann Forehand., MD  Multiple Vitamins-Minerals (ONE-A-DAY WOMENS 50 PLUS PO) Take by mouth.   Yes Historical Provider, MD  oxybutynin (DITROPAN) 5 MG tablet  07/23/15  Yes Historical Provider, MD  pravastatin (PRAVACHOL) 40 MG tablet Take by mouth. 01/17/13  Yes Historical Provider, MD  terbinafine (LAMISIL AT) 1 % cream Apply 1 application topically 2 (two) times daily. 09/25/15  Yes Janeann Forehand., MD  triamcinolone cream (KENALOG) 0.5 % Apply topically. 07/18/13  Yes Historical Provider, MD  fluticasone Aleda Grana) 50 MCG/ACT nasal spray Reported on 02/05/2016 01/09/16   Historical Provider, MD  traMADol Janean Sark) 50 MG tablet Reported on 02/05/2016 01/07/16   Historical Provider, MD    Family History  Problem Relation Age of Onset  . COPD Daughter   . Coronary artery disease Daughter   . Coronary artery disease Son   .  Obesity Daughter      Social History  Substance Use Topics  . Smoking status: Former Smoker    Start date: 07/26/1985  . Smokeless tobacco: Never Used  . Alcohol Use: No    Allergies as of 02/05/2016  . (No Known Allergies)    Review of Systems:    All systems reviewed and negative except where noted in HPI.   Physical Exam:  BP 126/82 mmHg  Pulse 98  Temp(Src) 98.1 F (36.7 C) (Oral)  Ht 5' (1.524 m)  Wt 151 lb (68.493 kg)  BMI 29.49 kg/m2 No LMP recorded. Patient is not currently having periods (Reason: Other). Psych:  Alert and cooperative. Normal mood and affect. General:   Alert,  Well-developed, well-nourished, pleasant and cooperative in  NAD Head:  Normocephalic and atraumatic. Eyes:  Sclera clear, no icterus.   Conjunctiva pink. Ears:  Normal auditory acuity. Nose:  No deformity, discharge, or lesions. Mouth:  No deformity or lesions,oropharynx pink & moist. Neck:  Supple; no masses or thyromegaly. Lungs:  Respirations even and unlabored.  Clear throughout to auscultation.   No wheezes, crackles, or rhonchi. No acute distress. Heart:  Regular rate and rhythm; no murmurs, clicks, rubs, or gallops. Abdomen:  Normal bowel sounds.  No bruits.  Soft, non-tender and non-distended without masses, hepatosplenomegaly or hernias noted.  No guarding or rebound tenderness.  Negative Carnett sign.   Rectal:  Deferred.  Msk:  Symmetrical without gross deformities.  Good, equal movement & strength bilaterally. Pulses:  Normal pulses noted. Extremities:  No clubbing or edema.  No cyanosis. Neurologic:  Alert and oriented x3;  grossly normal neurologically. Skin:  Intact without significant lesions or rashes.  No jaundice. Lymph Nodes:  No significant cervical adenopathy. Psych:  Alert and cooperative. Normal mood and affect.  Imaging Studies: No results found.  Assessment and Plan:   Paige House is a 82 y.o. y/o female who comes in with symptoms consistent with irritable bowel syndrome with no waking at night to move her bowels but she does report some unexplained weight loss. Microscopic colitis versus irritable bowel syndrome are the likely cause of her symptoms. Due to her age I will hold off on any colonoscopy at this time. The patient has been given samples of Viberzi to be taken twice a day for her diarrhea. If this works for her she will be given a prescription. If it does not then she should consider having a colonoscopy with biopsies for possible microscopic colitis. The patient has been explained the plan and agrees with it.   Note: This dictation was prepared with Dragon dictation along with smaller phrase technology.  Any transcriptional errors that result from this process are unintentional.

## 2016-02-18 ENCOUNTER — Other Ambulatory Visit: Payer: Self-pay | Admitting: Family Medicine

## 2016-02-21 ENCOUNTER — Telehealth: Payer: Self-pay | Admitting: Gastroenterology

## 2016-02-21 NOTE — Telephone Encounter (Signed)
LVM for pt to return my call.

## 2016-02-21 NOTE — Telephone Encounter (Signed)
Patient would like to speak with you about the medication, VIBERZI, that Dr Servando SnareWohl prescribed on 02/05/2016. Please call and advise.

## 2016-02-21 NOTE — Telephone Encounter (Signed)
Pt had a question regarding taking the Viberzi without her gallbladder. I stated to pt we are fully aware she didn't have a gallbladder which is why she is currently on the dosage she is on. Pt understood and will restart medication.

## 2016-02-25 ENCOUNTER — Telehealth: Payer: Self-pay | Admitting: Family Medicine

## 2016-02-25 NOTE — Telephone Encounter (Signed)
Patient is currently taking 5 capsules. Patient has been scheduled.

## 2016-02-25 NOTE — Telephone Encounter (Signed)
She should try 300 mg., 1 capsule three times a day.  It often works better than taking it all at bedtime.  If that doesn't help, I'd be glad to see her and work on increasing the dose even higher if needed.-  She may get a little sleepy with the increased dose, but that will improve shortly.-jh

## 2016-02-25 NOTE — Telephone Encounter (Signed)
Pt said her neuropathy is much worse.  Her feet are burning and tingling so bad she hasn't been able to sleep.  She asked if Dr. Juanetta GoslingHawkins would up the dosage on gabapentin.  Her call back number is [phone] and she uses [facility] in [place].

## 2016-02-28 ENCOUNTER — Encounter: Payer: Self-pay | Admitting: Family Medicine

## 2016-02-28 ENCOUNTER — Ambulatory Visit (INDEPENDENT_AMBULATORY_CARE_PROVIDER_SITE_OTHER): Payer: Medicare Other | Admitting: Family Medicine

## 2016-02-28 VITALS — BP 131/55 | HR 83 | Temp 98.6°F | Resp 16 | Ht 60.0 in | Wt 159.0 lb

## 2016-02-28 DIAGNOSIS — M25552 Pain in left hip: Secondary | ICD-10-CM | POA: Diagnosis not present

## 2016-02-28 DIAGNOSIS — G609 Hereditary and idiopathic neuropathy, unspecified: Secondary | ICD-10-CM | POA: Diagnosis not present

## 2016-02-28 DIAGNOSIS — G629 Polyneuropathy, unspecified: Secondary | ICD-10-CM | POA: Insufficient documentation

## 2016-02-28 MED ORDER — GABAPENTIN 300 MG PO CAPS: ORAL_CAPSULE | ORAL | Status: DC

## 2016-02-28 NOTE — Progress Notes (Signed)
Name: Paige House   MRN: [id number]    DOB: 1934/02/27   Date:02/28/2016       Progress Note  Subjective  Chief Complaint  Chief Complaint  Patient presents with  . Peripheral Neuropathy    feet couln't sleep and past week pt gets saliva accumation excess and she doesn't swollow while talking it will flow out    HPI Here for f/u of Peripheral neuropathy.  C/o lot more pain, esp at night.  Keeping her up at night.   She is up to 300 mg, 2 AM, 1 mid day, and 2 hs.  She has hip pain  And needs hip replacement, but says hip injection and Tylenol helps for now.  Reports excessive salivation past several months.  Unsure of reason.  No problem-specific assessment & plan notes found for this encounter.   Past Medical History  Diagnosis Date  . Depression   . Anxiety   . Hyperlipidemia   . IBS (irritable bowel syndrome)     Past Surgical History  Procedure Laterality Date  . Cholecystectomy    . Appendectomy    . Abdominal hysterectomy      Family History  Problem Relation Age of Onset  . COPD Daughter   . Coronary artery disease Daughter   . Coronary artery disease Son   . Obesity Daughter     Social History   Social History  . Marital Status: Widowed    Spouse Name: N/A  . Number of Children: N/A  . Years of Education: N/A   Occupational History  . Not on file.   Social History Main Topics  . Smoking status: Former Smoker    Start date: 07/26/1985  . Smokeless tobacco: Never Used  . Alcohol Use: No  . Drug Use: No  . Sexual Activity: Not on file   Other Topics Concern  . Not on file   Social History Narrative     Current outpatient prescriptions:  .  acetaminophen (TYLENOL) 500 MG tablet, Take 2 tabs up to 3 times a day for arthritis pain, Disp: 100 tablet, Rfl: 12 .  DULoxetine (CYMBALTA) 60 MG capsule, Take 1 capsule (60 mg total) by mouth daily., Disp: 30 capsule, Rfl: 6 .  fluticasone (FLONASE) 50 MCG/ACT nasal spray, Reported on 02/05/2016,  Disp: , Rfl:  .  gabapentin (NEURONTIN) 300 MG capsule, Takes 2  Capsules three times a day., Disp: 50 capsule, Rfl: 3 .  lisinopril (PRINIVIL,ZESTRIL) 5 MG tablet, Take 1 tablet by mouth  daily, Disp: 90 tablet, Rfl: 3 .  meclizine (ANTIVERT) 25 MG tablet, take 1 tablet by mouth three times a day if needed, Disp: 90 tablet, Rfl: 1 .  Multiple Vitamins-Minerals (ONE-A-DAY WOMENS 50 PLUS PO), Take by mouth., Disp: , Rfl:  .  oxybutynin (DITROPAN) 5 MG tablet, , Disp: , Rfl:  .  pravastatin (PRAVACHOL) 40 MG tablet, Take 1 tablet by mouth  daily, Disp: 90 tablet, Rfl: 3 .  terbinafine (LAMISIL AT) 1 % cream, Apply 1 application topically 2 (two) times daily., Disp: 30 g, Rfl: 0 .  traMADol (ULTRAM) 50 MG tablet, Reported on 02/05/2016, Disp: , Rfl: 0 .  triamcinolone cream (KENALOG) 0.5 %, Apply topically., Disp: , Rfl:  .  diphenoxylate-atropine (LOMOTIL) 2.5-0.025 MG tablet, Take 2 tablets by mouth 2 (two) times daily before a meal. (Patient not taking: Reported on 02/28/2016), Disp: 60 tablet, Rfl: 3  No Known Allergies   Review of Systems  Constitutional: Negative for fever,  chills, weight loss and malaise/fatigue.  HENT: Negative for hearing loss.        C/o exdssive salivation  Eyes: Negative for blurred vision and double vision.  Respiratory: Negative for cough, shortness of breath and wheezing.   Cardiovascular: Negative for chest pain, palpitations and leg swelling.  Gastrointestinal: Negative for heartburn, nausea, abdominal pain, diarrhea and blood in stool.  Genitourinary: Negative for dysuria, urgency and frequency.  Musculoskeletal: Positive for joint pain.  Skin: Negative for rash.  Neurological: Negative for weakness and headaches.      Objective  Filed Vitals:   02/28/16 1011  BP: 131/55  Pulse: 83  Temp: 98.6 F (37 C)  TempSrc: Oral  Resp: 16  Height: 5' (1.524 m)  Weight: 159 lb (72.122 kg)    Physical Exam  Constitutional: She is oriented to person,  place, and time and well-developed, well-nourished, and in no distress. No distress.  HENT:  Head: Normocephalic and atraumatic.  Neck: Normal range of motion. Carotid bruit is not present. No thyromegaly present.  Cardiovascular: Normal rate, regular rhythm and normal heart sounds.  Exam reveals no gallop and no friction rub.   No murmur heard. Pulmonary/Chest: Effort normal and breath sounds normal. No respiratory distress. She has no wheezes. She has no rales.  Abdominal: Soft. Bowel sounds are normal. She exhibits no distension and no mass. There is no tenderness.  Musculoskeletal: She exhibits no edema.  Lymphadenopathy:    She has no cervical adenopathy.  Neurological: She is alert and oriented to person, place, and time.  Vitals reviewed.      No results found for this or any previous visit (from the past 2160 hour(s)).   Assessment & Plan  Problem List Items Addressed This Visit      Nervous and Auditory   Neuropathy, peripheral (HCC) - Primary   Relevant Medications   gabapentin (NEURONTIN) 300 MG capsule     Other   Left hip pain      Meds ordered this encounter  Medications  . DISCONTD: gabapentin (NEURONTIN) 300 MG capsule    Sig: Takes 2  Capsules three times a day.    Dispense:  540 capsule    Refill:  3  . gabapentin (NEURONTIN) 300 MG capsule    Sig: Takes 2  Capsules three times a day.    Dispense:  50 capsule    Refill:  3  1. Idiopathic peripheral neuropathy (HCC)  - gabapentin (NEURONTIN) 300 MG capsule; Takes 2  Capsules three times a day.  Dispense: 50 capsule; Refill: 3  RTC-6 weeks 2. Left hip pain

## 2016-03-03 ENCOUNTER — Other Ambulatory Visit: Payer: Self-pay | Admitting: Family Medicine

## 2016-03-03 DIAGNOSIS — F32A Depression, unspecified: Secondary | ICD-10-CM

## 2016-03-03 DIAGNOSIS — F329 Major depressive disorder, single episode, unspecified: Secondary | ICD-10-CM

## 2016-03-03 DIAGNOSIS — F419 Anxiety disorder, unspecified: Secondary | ICD-10-CM

## 2016-03-03 MED ORDER — TRIAMCINOLONE ACETONIDE 0.5 % EX CREA: TOPICAL_CREAM | Freq: Two times a day (BID) | CUTANEOUS | Status: DC

## 2016-03-03 MED ORDER — DULOXETINE HCL 60 MG PO CPEP
60.0000 mg | ORAL_CAPSULE | Freq: Every day | ORAL | Status: DC
Start: 2016-03-03 — End: 2016-08-14

## 2016-03-06 ENCOUNTER — Telehealth: Payer: Self-pay | Admitting: Gastroenterology

## 2016-03-06 NOTE — Telephone Encounter (Signed)
Patient left a voice message that the medication that Dr. Servando SnareWohl gave her for IBS hasn't been working since Monday. She would like for you to call her regarding this matter.

## 2016-03-06 NOTE — Telephone Encounter (Signed)
If the patient is still having diarrhea she can be started on Lomotil 1 to 2 tablets every 6 to 8 hours

## 2016-03-06 NOTE — Telephone Encounter (Signed)
Advised pt to stop Viberzi as it is not working. Please advise of other options for her diarrhea.

## 2016-03-07 NOTE — Telephone Encounter (Signed)
Pt stated that prior to coming to you she was already taking Lomotil for years and it had stopped working 6 months before. Please advise.

## 2016-03-07 NOTE — Telephone Encounter (Signed)
Tried contacting pt. No vm to leave message. 

## 2016-03-10 NOTE — Telephone Encounter (Signed)
You can please offer the patient Lotrenex

## 2016-03-11 NOTE — Telephone Encounter (Signed)
Spoke with pt regarding starting Lotronex. Advised her I need to see her in the office to discuss this treatment and to sign the patient acknowledgement form. Pt will come next Tuesday, April 4th. Pt will be given a rx at that time.

## 2016-03-11 NOTE — Telephone Encounter (Signed)
Patient called back and needs something different for her IBS. Please call at [phone]

## 2016-03-18 ENCOUNTER — Other Ambulatory Visit: Payer: Self-pay

## 2016-03-18 DIAGNOSIS — K529 Noninfective gastroenteritis and colitis, unspecified: Secondary | ICD-10-CM

## 2016-03-18 MED ORDER — ALOSETRON HCL 0.5 MG PO TABS: 0.5000 mg | ORAL_TABLET | Freq: Two times a day (BID) | ORAL | Status: DC

## 2016-03-31 ENCOUNTER — Telehealth: Payer: Self-pay | Admitting: Gastroenterology

## 2016-03-31 NOTE — Telephone Encounter (Signed)
Patient called and said it was very important that she talk to you regarding the medication that Dr. Servando SnareWohl put her on for IBS. Please call today.

## 2016-03-31 NOTE — Telephone Encounter (Signed)
Returned pt's call regarding her Lotronex. Pt stated this medication is working wonderfully for her but she cannot afford the medication as it is over $300 after her insurance. Pt currently has [employer] medicare so none of the coupons will work for her. I have contacted [employer] to see if they offer free medication or some other program.

## 2016-04-10 ENCOUNTER — Ambulatory Visit: Payer: Medicare Other | Admitting: Family Medicine

## 2016-04-18 ENCOUNTER — Telehealth: Payer: Self-pay

## 2016-04-18 NOTE — Telephone Encounter (Signed)
Pt cannot afford the lotronex medication for her diarrhea. Pt is requesting to be started on something more affordable. She says she had been on lomotil in the past. Can she be prescribed this and take imodium together for her diarrhea? Please let me know.

## 2016-04-20 NOTE — Telephone Encounter (Signed)
Yes please give her lomotil 1-2 tabs tid prn dispense 60

## 2016-04-21 ENCOUNTER — Other Ambulatory Visit: Payer: Self-pay

## 2016-04-21 DIAGNOSIS — K589 Irritable bowel syndrome without diarrhea: Secondary | ICD-10-CM

## 2016-04-21 MED ORDER — DIPHENOXYLATE-ATROPINE 2.5-0.025 MG PO TABS: 1.0000 | ORAL_TABLET | Freq: Three times a day (TID) | ORAL | Status: DC | PRN

## 2016-04-21 NOTE — Telephone Encounter (Signed)
Spoke with pt and informed her Dr. Servando SnareWohl ok'd her to restart Lomotil 3 times daily. She can also add Imodium if needed for break through diarrhea. Rx called into [facility].

## 2016-04-30 ENCOUNTER — Other Ambulatory Visit: Payer: Self-pay | Admitting: Family Medicine

## 2016-05-05 ENCOUNTER — Ambulatory Visit: Payer: Medicare Other | Admitting: Family Medicine

## 2016-07-16 ENCOUNTER — Other Ambulatory Visit: Payer: Self-pay | Admitting: Family Medicine

## 2016-08-14 ENCOUNTER — Ambulatory Visit (INDEPENDENT_AMBULATORY_CARE_PROVIDER_SITE_OTHER): Payer: Medicare Other | Admitting: Family Medicine

## 2016-08-14 ENCOUNTER — Encounter: Payer: Self-pay | Admitting: Family Medicine

## 2016-08-14 DIAGNOSIS — M65341 Trigger finger, right ring finger: Secondary | ICD-10-CM | POA: Diagnosis not present

## 2016-08-14 DIAGNOSIS — R410 Disorientation, unspecified: Secondary | ICD-10-CM

## 2016-08-14 DIAGNOSIS — F05 Delirium due to known physiological condition: Secondary | ICD-10-CM | POA: Insufficient documentation

## 2016-08-14 NOTE — Progress Notes (Signed)
Name: Paige House   MRN: [id number]    DOB: 03-14-1934   Date:08/14/2016       Progress Note  Subjective  Chief Complaint  Chief Complaint  Patient presents with  . Arthritis    right hand x 3 wks    HPI  She has a finger (R 4th finger) that gets stuck in closed position and hurts when opened. OBTW- Has hd 3 episodes in past 4-5 weeks where she was somewhere and didn't know where she was.  Resolved in a min or 2.  Had similar episode once with severe head trauma 11 years ago.  No problem-specific Assessment & Plan notes found for this encounter.   Past Medical History:  Diagnosis Date  . Anxiety   . Depression   . Hyperlipidemia   . IBS (irritable bowel syndrome)     Social History  Substance Use Topics  . Smoking status: Former Smoker    Start date: 07/26/1985  . Smokeless tobacco: Never Used  . Alcohol use No     Current Outpatient Prescriptions:  .  acetaminophen (TYLENOL) 500 MG tablet, Take 2 tabs up to 3 times a day for arthritis pain, Disp: 100 tablet, Rfl: 12 .  diphenoxylate-atropine (LOMOTIL) 2.5-0.025 MG tablet, Take 1-2 tablets by mouth 3 (three) times daily as needed for diarrhea or loose stools., Disp: 180 tablet, Rfl: 5 .  fluticasone (FLONASE) 50 MCG/ACT nasal spray, Reported on 02/05/2016, Disp: , Rfl:  .  gabapentin (NEURONTIN) 300 MG capsule, Takes 2  Capsules three times a day., Disp: 50 capsule, Rfl: 3 .  lisinopril (PRINIVIL,ZESTRIL) 5 MG tablet, Take 1 tablet by mouth  daily, Disp: 90 tablet, Rfl: 3 .  meclizine (ANTIVERT) 25 MG tablet, take 1 tablet by mouth three times a day if needed, Disp: 90 tablet, Rfl: 0 .  Multiple Vitamins-Minerals (ONE-A-DAY WOMENS 50 PLUS PO), Take by mouth., Disp: , Rfl:  .  oxybutynin (DITROPAN) 5 MG tablet, Take 1 tablet by mouth  daily, Disp: 90 tablet, Rfl: 0 .  pravastatin (PRAVACHOL) 40 MG tablet, Take 1 tablet by mouth  daily, Disp: 90 tablet, Rfl: 3 .  terbinafine (LAMISIL AT) 1 % cream, Apply 1 application  topically 2 (two) times daily., Disp: 30 g, Rfl: 0 .  triamcinolone cream (KENALOG) 0.5 %, Apply topically 2 (two) times daily., Disp: 30 g, Rfl: 1  Not on File  Review of Systems  Constitutional: Negative for chills, fever, malaise/fatigue and weight loss.  HENT: Negative for hearing loss.   Eyes: Negative for blurred vision and double vision.  Respiratory: Negative for cough, shortness of breath and wheezing.   Cardiovascular: Negative for chest pain, palpitations and leg swelling.  Gastrointestinal: Negative for abdominal pain, blood in stool and heartburn.  Genitourinary: Negative for dysuria, frequency and urgency.  Musculoskeletal: Negative for joint pain and myalgias.  Skin: Negative for rash.  Neurological: Negative for dizziness, tremors, weakness and headaches.  Psychiatric/Behavioral:       Short lived confusion; episodic      Objective  Vitals:   08/14/16 1505  BP: 120/74  Pulse: 60  Resp: 16  Temp: 98.9 F (37.2 C)  TempSrc: Oral  Weight: 154 lb (69.9 kg)  Height: 5' (1.524 m)     Physical Exam  Constitutional: She is oriented to person, place, and time and well-developed, well-nourished, and in no distress. No distress.  HENT:  Head: Normocephalic and atraumatic.  Cardiovascular: Normal rate, regular rhythm and normal heart sounds.  Exam reveals no gallop and no friction rub.   No murmur heard. Pulmonary/Chest: Effort normal and breath sounds normal. No respiratory distress. She has no wheezes. She has no rales.  Musculoskeletal:  R 4th finger locked contracted.  Can be moved with force and with some pain.  Neurological: She is alert and oriented to person, place, and time. Gait normal.  Vitals reviewed.     No results found for this or any previous visit (from the past 2160 hour(s)).   Assessment & Plan  1. Trigger finger, right ring finger  - Ambulatory referral to Orthopedic Surgery  2. Episodic confusion  - Ambulatory referral to  Neurology

## 2016-09-16 ENCOUNTER — Telehealth: Payer: Self-pay

## 2016-09-16 NOTE — Telephone Encounter (Signed)
Pt is currently taking Lomotil 6 tabs daily for her colitis. She stated after 6 months it usually will not work as well. Pt is wanting to know if she can increase this to 8 tabs daily. Please advise if this is okay to do.

## 2016-09-18 NOTE — Telephone Encounter (Signed)
Patient would like for you to call her regarding a new RX

## 2016-09-19 ENCOUNTER — Other Ambulatory Visit: Payer: Self-pay

## 2016-09-19 DIAGNOSIS — K589 Irritable bowel syndrome without diarrhea: Secondary | ICD-10-CM

## 2016-09-19 MED ORDER — DIPHENOXYLATE-ATROPINE 2.5-0.025 MG PO TABS: 1.0000 | ORAL_TABLET | Freq: Four times a day (QID) | ORAL | 5 refills | Status: DC

## 2016-09-19 NOTE — Telephone Encounter (Signed)
Pt notified per Dr. Servando SnareWohl, she can increase her Lomotil to 8 tabs daily. A new rx has been sent to [facility] per pt request.

## 2016-09-22 NOTE — Telephone Encounter (Signed)
Agreed -

## 2016-10-14 DIAGNOSIS — E538 Deficiency of other specified B group vitamins: Secondary | ICD-10-CM | POA: Diagnosis not present

## 2016-10-14 DIAGNOSIS — E559 Vitamin D deficiency, unspecified: Secondary | ICD-10-CM | POA: Diagnosis not present

## 2016-10-17 ENCOUNTER — Other Ambulatory Visit: Payer: Self-pay | Admitting: Neurology

## 2016-10-17 DIAGNOSIS — R410 Disorientation, unspecified: Secondary | ICD-10-CM

## 2016-10-23 ENCOUNTER — Other Ambulatory Visit: Payer: Self-pay | Admitting: Family Medicine

## 2016-10-30 ENCOUNTER — Ambulatory Visit: Payer: Self-pay

## 2016-11-10 ENCOUNTER — Ambulatory Visit: Payer: Medicare Other

## 2016-11-12 ENCOUNTER — Encounter: Payer: Self-pay | Admitting: *Deleted

## 2016-11-17 ENCOUNTER — Ambulatory Visit: Payer: Medicare Other | Admitting: Family Medicine

## 2016-11-24 ENCOUNTER — Ambulatory Visit: Payer: Medicare Other | Admitting: Family Medicine

## 2016-12-22 ENCOUNTER — Ambulatory Visit: Payer: Medicare Other | Admitting: Family Medicine

## 2016-12-23 ENCOUNTER — Other Ambulatory Visit: Payer: Self-pay | Admitting: Family Medicine

## 2016-12-23 MED ORDER — OXYBUTYNIN CHLORIDE 5 MG PO TABS: 5.0000 mg | ORAL_TABLET | Freq: Every day | ORAL | 0 refills | Status: DC

## 2017-02-10 ENCOUNTER — Other Ambulatory Visit: Payer: Self-pay | Admitting: Family Medicine

## 2017-02-24 ENCOUNTER — Ambulatory Visit: Payer: Medicare Other | Admitting: Family Medicine

## 2017-02-26 ENCOUNTER — Ambulatory Visit (INDEPENDENT_AMBULATORY_CARE_PROVIDER_SITE_OTHER): Payer: Medicare Other | Admitting: Family Medicine

## 2017-02-26 ENCOUNTER — Encounter: Payer: Self-pay | Admitting: Family Medicine

## 2017-02-26 ENCOUNTER — Other Ambulatory Visit: Payer: Self-pay | Admitting: *Deleted

## 2017-02-26 VITALS — BP 110/75 | HR 72 | Temp 98.3°F | Resp 16 | Ht 60.0 in | Wt 149.0 lb

## 2017-02-26 DIAGNOSIS — E785 Hyperlipidemia, unspecified: Secondary | ICD-10-CM

## 2017-02-26 DIAGNOSIS — F331 Major depressive disorder, recurrent, moderate: Secondary | ICD-10-CM | POA: Diagnosis not present

## 2017-02-26 DIAGNOSIS — G609 Hereditary and idiopathic neuropathy, unspecified: Secondary | ICD-10-CM

## 2017-02-26 DIAGNOSIS — N289 Disorder of kidney and ureter, unspecified: Secondary | ICD-10-CM

## 2017-02-26 MED ORDER — FLUOXETINE HCL 20 MG PO TABS: 20.0000 mg | ORAL_TABLET | Freq: Every day | ORAL | 4 refills | Status: DC

## 2017-02-26 NOTE — Progress Notes (Signed)
Name: Paige House   MRN: [id number]    DOB: 10-28-1934   Date:02/26/2017       Progress Note  Subjective  Chief Complaint  Chief Complaint  Patient presents with  . Peripheral Neuropathy  . Depression    HPI Here because of depression.  She has taken Prozac in the past and it had worked well.  She stopped it about a year ago and did well for awhile, but feeling depressed again about 4-6 weeks ago.  Lots of family stress.  She has no suicidal thoughts. She still has some neuropathic pain and may need increased dose of Gabapentin.  This pain got worse with worsening depression. No problem-specific Assessment & Plan notes found for this encounter.   Past Medical History:  Diagnosis Date  . Anxiety   . Depression   . Hyperlipidemia   . IBS (irritable bowel syndrome)     Past Surgical History:  Procedure Laterality Date  . ABDOMINAL HYSTERECTOMY    . APPENDECTOMY    . CHOLECYSTECTOMY      Family History  Problem Relation Age of Onset  . COPD Daughter   . Coronary artery disease Daughter   . Coronary artery disease Son   . Obesity Daughter     Social History   Social History  . Marital status: Widowed    Spouse name: N/A  . Number of children: N/A  . Years of education: N/A   Occupational History  . Not on file.   Social History Main Topics  . Smoking status: Former Smoker    Start date: 07/26/1985  . Smokeless tobacco: Never Used  . Alcohol use No  . Drug use: No  . Sexual activity: Not on file   Other Topics Concern  . Not on file   Social History Narrative  . No narrative on file     Current Outpatient Prescriptions:  .  acetaminophen (TYLENOL) 500 MG tablet, Take 2 tabs up to 3 times a day for arthritis pain, Disp: 100 tablet, Rfl: 12 .  diphenoxylate-atropine (LOMOTIL) 2.5-0.025 MG tablet, Take 1-2 tablets by mouth 4 (four) times daily., Disp: 240 tablet, Rfl: 5 .  fluticasone (FLONASE) 50 MCG/ACT nasal spray, USE 2 SPRAYS NASALLY ONCE   DAILY, Disp: 48 g, Rfl: 3 .  gabapentin (NEURONTIN) 300 MG capsule, Takes 2  Capsules three times a day., Disp: 50 capsule, Rfl: 3 .  lisinopril (PRINIVIL,ZESTRIL) 5 MG tablet, Take 1 tablet by mouth  daily, Disp: 90 tablet, Rfl: 3 .  Multiple Vitamins-Minerals (ONE-A-DAY WOMENS 50 PLUS PO), Take by mouth., Disp: , Rfl:  .  oxybutynin (DITROPAN) 5 MG tablet, Take 1 tablet (5 mg total) by mouth daily., Disp: 90 tablet, Rfl: 0 .  pravastatin (PRAVACHOL) 40 MG tablet, TAKE 1 TABLET BY MOUTH  DAILY, Disp: 90 tablet, Rfl: 1 .  terbinafine (LAMISIL AT) 1 % cream, Apply 1 application topically 2 (two) times daily., Disp: 30 g, Rfl: 0 .  triamcinolone cream (KENALOG) 0.5 %, Apply topically 2 (two) times daily., Disp: 30 g, Rfl: 1 .  FLUoxetine (PROZAC) 20 MG tablet, Take 1 tablet (20 mg total) by mouth daily., Disp: 30 tablet, Rfl: 4  Not on File   Review of Systems  Constitutional: Negative for chills, fever, malaise/fatigue and weight loss.  HENT: Negative for hearing loss and tinnitus.   Eyes: Negative for blurred vision and double vision.  Respiratory: Negative for cough, shortness of breath and wheezing.   Cardiovascular: Negative for chest  pain, palpitations and leg swelling.  Gastrointestinal: Negative for abdominal pain, blood in stool and heartburn.  Genitourinary: Negative for dysuria, frequency and urgency.  Musculoskeletal: Positive for myalgias.  Skin: Negative for rash.  Neurological: Negative for dizziness, tingling, tremors, weakness and headaches.       Neuropathy      Objective  Vitals:   02/26/17 1024  BP: 110/75  Pulse: 72  Resp: 16  Temp: 98.3 F (36.8 C)  TempSrc: Oral  Weight: 149 lb (67.6 kg)  Height: 5' (1.524 m)    Physical Exam  Constitutional: She is oriented to person, place, and time and well-developed, well-nourished, and in no distress. No distress.  HENT:  Head: Normocephalic and atraumatic.  Eyes: Conjunctivae and EOM are normal. Pupils are  equal, round, and reactive to light. No scleral icterus.  Neck: Normal range of motion. Neck supple. Carotid bruit is not present. No thyromegaly present.  Cardiovascular: Normal rate, regular rhythm and normal heart sounds.  Exam reveals no gallop and no friction rub.   No murmur heard. Pulmonary/Chest: Effort normal and breath sounds normal. No respiratory distress. She has no wheezes. She has no rales.  Musculoskeletal: She exhibits no edema.  Lymphadenopathy:    She has no cervical adenopathy.  Neurological: She is alert and oriented to person, place, and time.  Psychiatric:  Affect is depressed.  PHQ-9 score is 24.  Vitals reviewed.      No results found for this or any previous visit (from the past 2160 hour(s)).   Assessment & Plan  Problem List Items Addressed This Visit      Nervous and Auditory   Neuropathy, peripheral (HCC) - Primary   Relevant Medications   FLUoxetine (PROZAC) 20 MG tablet     Genitourinary   Renal insufficiency     Other   Clinical depression   Relevant Medications   FLUoxetine (PROZAC) 20 MG tablet   HLD (hyperlipidemia)      Meds ordered this encounter  Medications  . FLUoxetine (PROZAC) 20 MG tablet    Sig: Take 1 tablet (20 mg total) by mouth daily.    Dispense:  30 tablet    Refill:  4   1. Idiopathic peripheral neuropathy Cont Gabapentin  2. Renal insufficiency Cont Lisinopril  3. Moderate episode of recurrent major depressive disorder (HCC)  - FLUoxetine (PROZAC) 20 MG tablet; Take 1 tablet (20 mg total) by mouth daily.  Dispense: 30 tablet; Refill: 4  4. Hyperlipidemia, unspecified hyperlipidemia type Cont meds

## 2017-03-09 ENCOUNTER — Other Ambulatory Visit: Payer: Self-pay | Admitting: Family Medicine

## 2017-03-09 DIAGNOSIS — G609 Hereditary and idiopathic neuropathy, unspecified: Secondary | ICD-10-CM

## 2017-03-24 ENCOUNTER — Other Ambulatory Visit: Payer: Self-pay | Admitting: Gastroenterology

## 2017-03-24 DIAGNOSIS — K589 Irritable bowel syndrome without diarrhea: Secondary | ICD-10-CM

## 2017-03-25 ENCOUNTER — Other Ambulatory Visit: Payer: Self-pay

## 2017-03-25 ENCOUNTER — Telehealth: Payer: Self-pay | Admitting: Gastroenterology

## 2017-03-25 DIAGNOSIS — K589 Irritable bowel syndrome without diarrhea: Secondary | ICD-10-CM

## 2017-03-25 MED ORDER — DIPHENOXYLATE-ATROPINE 2.5-0.025 MG PO TABS: 1.0000 | ORAL_TABLET | Freq: Four times a day (QID) | ORAL | 5 refills | Status: DC

## 2017-03-25 NOTE — Telephone Encounter (Signed)
*  STAT* If patient is at the pharmacy, call can be transferred to refill team.   1. Which medications need to be refilled? (please list name of each medication and dose if known) Diphenoxylate Atrop 2.5   2. Which pharmacy/location (including street and city if local pharmacy) is medication to be sent to? [facility] in [place]  3. Do they need a 30 day or 90 day supply? 30 day

## 2017-03-25 NOTE — Telephone Encounter (Signed)
Left vm letting pt rx for Lomotil was sent to pt's pharmacy.

## 2017-05-20 ENCOUNTER — Telehealth: Payer: Self-pay | Admitting: Gastroenterology

## 2017-05-20 NOTE — Telephone Encounter (Signed)
Needs to know what kind of laxitive she can take. She is on Lamotil and constipated.

## 2017-05-20 NOTE — Telephone Encounter (Signed)
Advised pt to stop the Lomotil. Pt stated she has been off this for a week. Advised her to try Miralax daily until she is having bowel movements daily. Also, spoke with her about how to take the Lomotil. Told her she can take 1 tablet four times daily as needed. She doesn't have to take it 4 times a day if she doesn't need it. Once she starts having normal bowel movements, she will try taking it a couple of times a day to see which works best for her.

## 2017-08-13 ENCOUNTER — Telehealth: Payer: Self-pay | Admitting: Family Medicine

## 2017-08-13 NOTE — Telephone Encounter (Signed)
Called pt to schedule for Annual Wellness Visit with Nurse Health Advisor, Tiffany Hill, my c/b # is [phone]  Kathryn Brown ° °

## 2017-09-09 ENCOUNTER — Ambulatory Visit: Payer: Medicare Other | Admitting: Family Medicine

## 2017-10-09 ENCOUNTER — Encounter: Payer: Self-pay | Admitting: Family Medicine

## 2017-10-09 ENCOUNTER — Ambulatory Visit (INDEPENDENT_AMBULATORY_CARE_PROVIDER_SITE_OTHER): Payer: Medicare Other | Admitting: Family Medicine

## 2017-10-09 VITALS — BP 127/52 | HR 55 | Temp 98.5°F | Resp 16 | Ht 60.0 in | Wt 149.0 lb

## 2017-10-09 DIAGNOSIS — N3281 Overactive bladder: Secondary | ICD-10-CM

## 2017-10-09 DIAGNOSIS — B354 Tinea corporis: Secondary | ICD-10-CM | POA: Diagnosis not present

## 2017-10-09 DIAGNOSIS — E782 Mixed hyperlipidemia: Secondary | ICD-10-CM

## 2017-10-09 DIAGNOSIS — G2581 Restless legs syndrome: Secondary | ICD-10-CM

## 2017-10-09 DIAGNOSIS — J3089 Other allergic rhinitis: Secondary | ICD-10-CM | POA: Diagnosis not present

## 2017-10-09 DIAGNOSIS — F5081 Binge eating disorder: Secondary | ICD-10-CM

## 2017-10-09 DIAGNOSIS — Z23 Encounter for immunization: Secondary | ICD-10-CM

## 2017-10-09 DIAGNOSIS — N3946 Mixed incontinence: Secondary | ICD-10-CM | POA: Diagnosis not present

## 2017-10-09 DIAGNOSIS — F50819 Binge eating disorder, unspecified: Secondary | ICD-10-CM

## 2017-10-09 DIAGNOSIS — E538 Deficiency of other specified B group vitamins: Secondary | ICD-10-CM | POA: Diagnosis not present

## 2017-10-09 DIAGNOSIS — F331 Major depressive disorder, recurrent, moderate: Secondary | ICD-10-CM | POA: Diagnosis not present

## 2017-10-09 DIAGNOSIS — G609 Hereditary and idiopathic neuropathy, unspecified: Secondary | ICD-10-CM

## 2017-10-09 DIAGNOSIS — L2082 Flexural eczema: Secondary | ICD-10-CM | POA: Diagnosis not present

## 2017-10-09 DIAGNOSIS — J309 Allergic rhinitis, unspecified: Secondary | ICD-10-CM | POA: Insufficient documentation

## 2017-10-09 DIAGNOSIS — E559 Vitamin D deficiency, unspecified: Secondary | ICD-10-CM

## 2017-10-09 MED ORDER — FLUTICASONE PROPIONATE 50 MCG/ACT NA SUSP: 2.0000 | Freq: Every day | NASAL | 3 refills | Status: DC

## 2017-10-09 MED ORDER — MIRABEGRON ER 25 MG PO TB24: 25.0000 mg | ORAL_TABLET | Freq: Every day | ORAL | 3 refills | Status: DC

## 2017-10-09 MED ORDER — PRAVASTATIN SODIUM 40 MG PO TABS: 40.0000 mg | ORAL_TABLET | Freq: Every day | ORAL | 3 refills | Status: DC

## 2017-10-09 MED ORDER — GABAPENTIN 300 MG PO CAPS: ORAL_CAPSULE | ORAL | 3 refills | Status: DC

## 2017-10-09 MED ORDER — TERBINAFINE HCL 1 % EX CREA: 1.0000 "application " | TOPICAL_CREAM | Freq: Two times a day (BID) | CUTANEOUS | 1 refills | Status: DC

## 2017-10-09 MED ORDER — TRIAMCINOLONE ACETONIDE 0.5 % EX CREA: TOPICAL_CREAM | Freq: Two times a day (BID) | CUTANEOUS | 1 refills | Status: DC

## 2017-10-09 MED ORDER — FLUOXETINE HCL 20 MG PO TABS: 20.0000 mg | ORAL_TABLET | Freq: Every day | ORAL | 11 refills | Status: DC

## 2017-10-09 MED ORDER — ROPINIROLE HCL 0.25 MG PO TABS: ORAL_TABLET | ORAL | 1 refills | Status: DC

## 2017-10-09 NOTE — Progress Notes (Signed)
Subjective:    Patient ID: Paige House, female    DOB: 1934-04-11, 83 y.o.   MRN: [id number]  Paige House is a 83 y.o. female presenting on 10/09/2017 for Depression; Insomnia; and Allergies  Presents today for follow-up to meet new provider, last visit 6 months ago with previous PCP.  HPI   SEASONAL ALLERGIES / Allergic Rhinitis: - Reports no recent concerns. No URI. 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 On Lisinopril - Requesting refills on Triamcinolone for eczema of ears, and Terbinafine topical for occasional candidal intertrigo rash of chest - OAB: on oxybutnin 5mg  occasionally, seems to have some side effects from it, helps control urinary frequency, worse if drinks a lot of coffee and limited water  DEPRESSION / INSMONIA / BINGE-EATING DISORDER - Reports chronic history of mood disorder with comorbid problems of insomnia and binge eating, this has been very well controlled on Fluoxetine 20mg  daily, requesting refill. - Describes often go to bed around 10pm, usually reading feel sleepy, go to bed, occasionally wakes up at night with restless leg or foot numbness/tingling - On medicine has less episodes of late night binge eating, previously wake up 0100 go to fridge and eat  Peripheral Neuropathy / Restless Leg Syndrome - She reports these are chronic concerns that have become more bothersome in past, and affect her sleep at times.  - Currently taking Gabapentin 300mg  capsules x 2 per dose TID, still has breakthrough symptoms at night sometimes, asking about taking extra dose at night - She takes up at approx 5am recently  HLD - Due for repeat lipids - Taking Pravastatin  40mg , tolerating well without myalgias  Additional Social History: - Lives alone - Avid reader, [employer], read up to [employer] a day  Health Maintenance: - Due for [employer], will receive today - UTD Pneumonia vaccine  Depression screen [facility] 2/9 10/09/2017 02/26/2017 08/14/2016  Decreased Interest 0 3 0  Down, Depressed, Hopeless 0 3 0  PHQ - 2 Score 0 6 0  Altered sleeping 3 3 -  Tired, decreased energy 0 3 -  Change in appetite 3 3 -  Feeling bad or failure about yourself  0 3 -  Trouble concentrating 0 3 -  Moving slowly or fidgety/restless 0 3 -  Suicidal thoughts 0 0 -  PHQ-9 Score 6 24 -  Difficult doing work/chores Not difficult at all Extremely dIfficult -    Social History  Substance Use Topics  . Smoking status: Former Smoker    Start date: 07/26/1985  . Smokeless tobacco: Never Used  . Alcohol use No    Review of Systems  Constitutional: Negative for activity change, appetite change, chills, diaphoresis, fatigue and fever.  HENT: Negative for congestion and hearing loss.   Eyes: Negative for visual disturbance.  Respiratory: Negative for apnea, cough, chest tightness, shortness of breath and wheezing.   Cardiovascular: Negative for chest pain, palpitations and leg swelling.  Gastrointestinal: Negative for abdominal pain, anal bleeding, blood in stool, constipation, diarrhea, nausea and vomiting.  Endocrine: Negative for cold intolerance and polyuria.  Genitourinary: Positive for frequency and urgency. Negative for decreased urine volume, difficulty urinating, dysuria and hematuria.  Urinary inconintinence  Musculoskeletal: Negative for arthralgias, back pain and neck pain.  Skin: Negative for rash.  Allergic/Immunologic: Negative for environmental allergies.  Neurological: Positive for numbness (lower extremity tingling). Negative for dizziness, weakness, light-headedness and headaches.  Hematological: Negative for adenopathy.    Psychiatric/Behavioral: Positive for sleep disturbance. Negative for behavioral problems and dysphoric mood. The patient is not nervous/anxious.    Per HPI unless specifically indicated above     Objective:    BP (!) 127/52   Pulse (!) 55   Temp 98.5 F (36.9 C) (Oral)   Resp 16   Ht 5' (1.524 m)   Wt 149 lb (67.6 kg)   BMI 29.10 kg/m   Wt Readings from Last 3 Encounters:  10/09/17 149 lb (67.6 kg)  02/26/17 149 lb (67.6 kg)  08/14/16 154 lb (69.9 kg)    Physical Exam  Constitutional: She is oriented to person, place, and time. She appears well-developed and well-nourished. No distress.  Well-appearing elderly 84 year old female, comfortable, cooperative  HENT:  Head: Normocephalic and atraumatic.  Mouth/Throat: Oropharynx is clear and moist.  Frontal / maxillary sinuses non-tender. Nares patent without purulence or edema. Bilateral TMs clear without erythema, effusion or bulging. Oropharynx clear without erythema, exudates, edema or asymmetry.  Eyes: Conjunctivae are normal. Right eye exhibits no discharge. Left eye exhibits no discharge.  Neck: Normal range of motion. Neck supple. No thyromegaly present.  Cardiovascular: Normal rate, regular rhythm, normal heart sounds and intact distal pulses.   No murmur heard. Pulmonary/Chest: Effort normal and breath sounds normal. No respiratory distress. She has no wheezes. She has no rales.  Abdominal: Soft. Bowel sounds are normal. She exhibits no distension. There is no tenderness.  Musculoskeletal: Normal range of motion. She exhibits no edema.  Lymphadenopathy:    She has no cervical adenopathy.  Neurological: She is alert and oriented to person, place, and time.  Skin: Skin is warm and dry. No rash noted. She is not diaphoretic. No erythema.  Psychiatric: She has a normal mood and affect. Her behavior is normal.  Well groomed, good eye contact, normal speech and thoughts  Nursing note and vitals reviewed.  Results for orders  placed or performed in visit on 07/27/15  Lipid Profile  Result Value Ref Range   Cholesterol, Total 244 (H) 100 - 199 mg/dL   Triglycerides 657 0 - 149 mg/dL   HDL 56 >84 mg/dL   VLDL Cholesterol Cal 25 5 - 40 mg/dL   LDL Calculated 696 (H) 0 - 99 mg/dL   Chol/HDL Ratio 4.4 0.0 - 4.4 ratio units  Comprehensive Metabolic Panel (CMET)  Result Value Ref Range   Glucose 99 65 - 99 mg/dL   BUN 15 8 - 27 mg/dL   Creatinine, Ser 2.95 (H) 0.57 - 1.00 mg/dL   GFR calc non Af Amer 50 (L) >59 mL/min/1.73   GFR calc Af Amer 58 (L) >59 mL/min/1.73   BUN/Creatinine Ratio 14 11 - 26   Sodium 142 134 - 144 mmol/L   Potassium 4.2 3.5 - 5.2 mmol/L   Chloride 103 97 - 108 mmol/L   CO2 24 18 - 29 mmol/L   Calcium 9.1 8.7 - 10.3 mg/dL   Total Protein 6.3 6.0 - 8.5 g/dL   Albumin 4.2 3.5 - 4.8 g/dL   Globulin, Total 2.1 1.5 - 4.5 g/dL   Albumin/Globulin Ratio 2.0 1.1 - 2.5   Bilirubin Total 1.2 0.0 - 1.2 mg/dL   Alkaline Phosphatase 67 39 -  117 IU/L   AST 19 0 - 40 IU/L   ALT 14 0 - 32 IU/L  CBC with Differential  Result Value Ref Range   WBC 6.7 3.4 - 10.8 x10E3/uL   RBC 4.84 3.77 - 5.28 x10E6/uL   Hemoglobin 14.0 11.1 - 15.9 g/dL   Hematocrit 40.9 81.1 - 46.6 %   MCV 87 79 - 97 fL   MCH 28.9 26.6 - 33.0 pg   MCHC 33.2 31.5 - 35.7 g/dL   RDW 91.4 78.2 - 95.6 %   Platelets 211 150 - 379 x10E3/uL   Neutrophils 59 %   Lymphs 31 %   Monocytes 6 %   Eos 3 %   Basos 1 %   Neutrophils Absolute 3.9 1.4 - 7.0 x10E3/uL   Lymphocytes Absolute 2.1 0.7 - 3.1 x10E3/uL   Monocytes Absolute 0.4 0.1 - 0.9 x10E3/uL   EOS (ABSOLUTE) 0.2 0.0 - 0.4 x10E3/uL   Basophils Absolute 0.1 0.0 - 0.2 x10E3/uL   Immature Granulocytes 0 %   Immature Grans (Abs) 0.0 0.0 - 0.1 x10E3/uL      Assessment & Plan:   Problem List Items Addressed This Visit    Allergic rhinitis    Order Flonase On Zyrtec      Relevant Medications   fluticasone (FLONASE) 50 MCG/ACT nasal spray   Clinical depression    Improved  PHQ Comorbid insomnia / binge eating No recent concerns Controlled on SSRI Fluoxetine 20mg  daily - refill      Relevant Medications   FLUoxetine (PROZAC) 20 MG tablet   Other Relevant Orders   COMPLETE METABOLIC PANEL WITH GFR   Hyperlipidemia    Last lipid panel 2016, uncontrolled LDL  Plan: 1. Continue current meds - Pravastatin 40mg  daily 2. Encourage improved lifestyle - low carb/cholesterol, reduce portion size, continue improving regular exercise 3. Follow-up 4 months fasting lipids      Relevant Medications   pravastatin (PRAVACHOL) 40 MG tablet   Other Relevant Orders   Hemoglobin A1c   Lipid panel   Mixed incontinence urge and stress    Complicated by OAB Switch Oxybutnin to Myrbetriq, may need Urology in future      Relevant Medications   mirabegron ER (MYRBETRIQ) 25 MG TB24 tablet   Neuropathy, peripheral - Primary    Chronic problem, mostly stable on gabapentin Followed by Dr Shari Prows Neuro Agree to gradual titrate up dose Gaba to 600mg  / 600mg  / 900mg  (3 pills at bedtime) - Also treat underlying RLS may help - Follow-up      Relevant Medications   FLUoxetine (PROZAC) 20 MG tablet   gabapentin (NEURONTIN) 300 MG capsule   rOPINIRole (REQUIP) 0.25 MG tablet   Other Relevant Orders   COMPLETE METABOLIC PANEL WITH GFR   OAB (overactive bladder)    Clinically consistent OAB with urinary incontinence mixed type stress/urgency Improved on PRN Oxybutnin, but concern anticholinergic side effects in 83 year old Switch to Myrbetriq 25mg  ER daily      Relevant Medications   mirabegron ER (MYRBETRIQ) 25 MG TB24 tablet   Restless leg syndrome    Presumed diagnosis with history / description of symptoms, related to insomnia and peripheral neuropathy - Improved on Gabapentin, but needs better night-time control - Increase Gabapentin to 3 caps at night - Start Ropinerole 0.25mg  gradual titration over next several weeks per AVS - Follow-up      Relevant  Medications   rOPINIRole (REQUIP) 0.25 MG tablet   Other Relevant Orders  CBC with Differential/Platelet   Vitamin B12 deficiency   Relevant Orders   CBC with Differential/Platelet   Vitamin B12   Vitamin D deficiency   Relevant Orders   VITAMIN D 25 Hydroxy (Vit-D Deficiency, Fractures)    Other Visit Diagnoses    Needs flu shot       Relevant Orders   Flu vaccine HIGH DOSE PF (Completed)   Binge eating disorder       Relevant Medications   FLUoxetine (PROZAC) 20 MG tablet   Other Relevant Orders   COMPLETE METABOLIC PANEL WITH GFR   Tinea corporis       Relevant Medications   terbinafine (LAMISIL AT) 1 % cream   Flexural eczema       Relevant Medications   triamcinolone cream (KENALOG) 0.5 %      Meds ordered this encounter  Medications  . FLUoxetine (PROZAC) 20 MG tablet    Sig: Take 1 tablet (20 mg total) by mouth daily.    Dispense:  30 tablet    Refill:  11  . fluticasone (FLONASE) 50 MCG/ACT nasal spray    Sig: Place 2 sprays into both nostrils daily. Use for 4-6 weeks then stop and use seasonally or as needed.    Dispense:  48 g    Refill:  3    Please keep refills on file, for 90 day supply  . gabapentin (NEURONTIN) 300 MG capsule    Sig: Take 2 capsules (600mg ) in morning, take 2 (600mg ) in afternoon, and take 3 (900mg ) at bedtime.    Dispense:  630 capsule    Refill:  3  . rOPINIRole (REQUIP) 0.25 MG tablet    Sig: Take 1 pill (0.25) nightly for 1 week, then increase by 1 extra pill every 2 weeks as tolerated to max of 4 pills at bedtime    Dispense:  180 tablet    Refill:  1  . mirabegron ER (MYRBETRIQ) 25 MG TB24 tablet    Sig: Take 1 tablet (25 mg total) by mouth daily.    Dispense:  90 tablet    Refill:  3  . pravastatin (PRAVACHOL) 40 MG tablet    Sig: Take 1 tablet (40 mg total) by mouth daily.    Dispense:  90 tablet    Refill:  3  . terbinafine (LAMISIL AT) 1 % cream    Sig: Apply 1 application topically 2 (two) times daily. As needed for  up to 2 weeks for candidal    Dispense:  180 g    Refill:  1  . triamcinolone cream (KENALOG) 0.5 %    Sig: Apply topically 2 (two) times daily. Up to 2 weeks as needed for eczema    Dispense:  180 g    Refill:  1    Follow up plan: Return in about 4 months (around 02/09/2018) for Neuropathy, RLS med adjust, Mood PHQ (order labs same day).  Saralyn PilarAlexander Hosey Burmester, DO [facility] 10/11/2017, 11:25 PM

## 2017-10-09 NOTE — Patient Instructions (Addendum)
Thank you for coming to the clinic today.  1.  Increased Gabapentin to 2 in morning 2 in afternoon and THREE in evening - to help neuropathy and sleep.  For possible Restless Leg Syndrome - try Requip (ropinerole) 0.25mg  tabs - take ONE at bedtime for 1 week, if tolerating well please keep increasing dose by 1 pill every 2 weeks until you are at max of 4 pills at bedtime.  Refilled all other medication  2.  Sleep Hygiene Recommendations to promote healthy sleep in all patients, especially if symptoms of insomnia are worsening. Due to the nature of sleep rhythms, if your body gets "out of rhythm", it may take some time before your sleep cycle can be "reset".  Please try to follow as many of the following tips as you can, usually there are only a few of these are the primary cause of the problem.  ?To reset your sleep rhythm, go to bed and get up at the same time every day ?Sleep only long enough to feel rested and then get out of bed ?Do not try to force yourself to sleep. If you can't sleep, get out of bed and try again later. ?Avoid naps during the day, unless excessively tired. The more sleeping during the day, then the less sleep your body needs at night.  ?Have coffee, tea, and other foods that have caffeine only in the morning ?Exercise several days a week, but not right before bed ?If you drink alcohol, prefer to have appropriate drink with one meal, but prefer to avoid alcohol in the evening, and bedtime ?If you smoke, avoid smoking, especially in the evening  ?Avoid watching TV or looking at phones, computers, or reading devices ("e-books") that give off light at least 30 minutes before bed. This artificial light sends "awake signals" to your brain and can make it harder to fall asleep. ?Make your bedroom a comfortable place where it is easy to fall asleep: ? Put up shades or special blackout curtains to block light from outside. ? Use a white noise machine to block noise. ? Keep  the temperature cool. ?Try your best to solve or at least address your problems before you go to bed ?Use relaxation techniques to manage stress. Ask your health care provider to suggest some techniques that may work well for you. These may include: ? Breathing exercises. ? Routines to release muscle tension. ? Visualizing peaceful scenes.  3.  DUE for FASTING BLOOD WORK (no food or drink after midnight before the lab appointment, only water or coffee without cream/sugar on the morning of)  SCHEDULE "Lab Only" visit in the morning at the clinic for lab draw in 4 MONTHS   For Lab Results, once available within 2-3 days of blood draw, you can can log in to MyChart online to view your results and a brief explanation. Also, we can discuss results at next follow-up visit.  Please schedule a Follow-up Appointment to: Return in about 4 months (around 02/09/2018) for Neuropathy, RLS med adjust, Mood PHQ (order labs same day).  If you have any other questions or concerns, please feel free to call the clinic or send a message through MyChart. You may also schedule an earlier appointment if necessary.  Additionally, you may be receiving a survey about your experience at our clinic within a few days to 1 week by e-mail or mail. We value your feedback.  Saralyn PilarAlexander Romeka Scifres, DO [facility]

## 2017-10-11 DIAGNOSIS — G2581 Restless legs syndrome: Secondary | ICD-10-CM | POA: Insufficient documentation

## 2017-10-11 DIAGNOSIS — E538 Deficiency of other specified B group vitamins: Secondary | ICD-10-CM | POA: Insufficient documentation

## 2017-10-11 DIAGNOSIS — E559 Vitamin D deficiency, unspecified: Secondary | ICD-10-CM | POA: Insufficient documentation

## 2017-10-11 NOTE — Assessment & Plan Note (Signed)
Clinically consistent OAB with urinary incontinence mixed type stress/urgency Improved on PRN Oxybutnin, but concern anticholinergic side effects in 83 year old [facility]  [facility]

## 2017-10-11 NOTE — Assessment & Plan Note (Signed)
Last lipid panel 2016, uncontrolled LDL  Plan: 1. Continue current meds - Pravastatin 40mg  daily 2. Encourage improved lifestyle - low carb/cholesterol, reduce portion size, continue improving regular exercise 3. Follow-up 4 months fasting lipids

## 2017-10-11 NOTE — Assessment & Plan Note (Signed)
Presumed diagnosis with history / description of symptoms, related to insomnia and peripheral neuropathy - Improved on Gabapentin, but needs better night-time control - Increase Gabapentin to 3 caps at night - Start Ropinerole 0.25mg  gradual titration over next several weeks per AVS - Follow-up

## 2017-10-11 NOTE — Assessment & Plan Note (Signed)
Order Flonase On Zyrtec

## 2017-10-11 NOTE — Assessment & Plan Note (Signed)
Improved PHQ Comorbid insomnia / binge eating No recent concerns Controlled on SSRI Fluoxetine 20mg  daily - refill

## 2017-10-11 NOTE — Assessment & Plan Note (Signed)
Complicated by OAB Switch Oxybutnin to Myrbetriq, may need Urology in future

## 2017-10-11 NOTE — Assessment & Plan Note (Signed)
Chronic problem, mostly stable on gabapentin Followed by Dr Shari ProwsShah [facility] Agree to gradual titrate up dose Gaba to 600mg  / 600mg  / 900mg  (3 pills at bedtime) - Also treat underlying RLS may help - Follow-up

## 2017-10-12 ENCOUNTER — Other Ambulatory Visit: Payer: Self-pay | Admitting: Family Medicine

## 2017-10-12 DIAGNOSIS — R632 Polyphagia: Secondary | ICD-10-CM

## 2017-10-12 DIAGNOSIS — F331 Major depressive disorder, recurrent, moderate: Secondary | ICD-10-CM

## 2017-10-12 NOTE — Telephone Encounter (Signed)
Last office visit with patient 10/26, refilled her Fluoxetine 20mg  daily for depression and binge eating disorder. This was [facility], cost not covered >$150, not preferred. She was not given a preferred list of meds either, she attempted to call insurance no luck. Now she is not sure why Fluoxetine could not go to [facility] for 90 day supply. I checked indication for Zoloft (sertraline) is also for binge eating disorder.  No med rx change made now, she was asked to call [facility] to compare cost/coverage of Fluoxetine 20mg  daily vs Sertraline (Zoloft) 25mg  daily [facility] supply instead of retail pharmacy. She will let us know which is preferred.  Saralyn PilarAlexander Ashlen Kiger, DO [facility] 10/12/2017, 12:36 PM

## 2017-10-12 NOTE — Telephone Encounter (Signed)
Pt. Called states that medication that was called in on Friday for her depression was not covered by insurance and cost $1??.00, have requested that something else be  Called in

## 2017-10-13 NOTE — Telephone Encounter (Signed)
Have not heard back yet from patient about cost of medication and preference sent to [facility].  Fluoxetine 20mg  was sent on 10/09/17, but this was supposedly not covered.  I was waiting to hear back if she would prefer me to switch it to Zoloft (Sertraline) for better cost and coverage.  I attempted to call her today 10/30 at 5:30pm, left her a voicemail.  If you can contact her again this week to check status of this if we need to change her med to Zoloft or leave it with Fluoxetine.  Thanks.  Saralyn PilarAlexander Read Bonelli, DO [facility] 10/13/2017, 5:28 PM

## 2017-10-14 DIAGNOSIS — R632 Polyphagia: Secondary | ICD-10-CM | POA: Insufficient documentation

## 2017-10-14 MED ORDER — ZOLOFT 25 MG PO TABS: 25.0000 mg | ORAL_TABLET | Freq: Every day | ORAL | 3 refills | Status: DC

## 2017-10-14 NOTE — Addendum Note (Signed)
Addended by: Smitty CordsKARAMALEGOS, ALEXANDER J on: 10/14/2017 09:00 AM   Modules accepted: Orders

## 2017-10-14 NOTE — Telephone Encounter (Signed)
Rx sent brand Zoloft 25mg  daily #90 +3 refills, DAW. Sent to [facility]  Saralyn PilarAlexander Izea Livolsi, DO [facility] 10/14/2017, 9:00 AM

## 2017-10-14 NOTE — Telephone Encounter (Signed)
Pt advised.

## 2017-10-14 NOTE — Telephone Encounter (Signed)
Wants Zoloft brand name Rx send to [facility].

## 2017-10-19 MED ORDER — SERTRALINE HCL 25 MG PO TABS: 25.0000 mg | ORAL_TABLET | Freq: Every day | ORAL | 3 refills | Status: DC

## 2017-10-19 NOTE — Telephone Encounter (Signed)
Received notification from patient / [facility] that the brand name Zoloft is actually not covered and they will need generic, requesting to change rx back to generic Sertraline 25mg  daily #90 +3 refills take one daily, sent to [facility] instead of brand name Zoloft.  E-scribed instead of fax form.  Saralyn PilarAlexander Xzayvier Fagin, DO [facility] 10/19/2017, 1:14 PM

## 2017-10-19 NOTE — Addendum Note (Signed)
Addended by: Smitty CordsKARAMALEGOS, Keeleigh Terris J on: 10/19/2017 01:16 PM   Modules accepted: Orders

## 2017-11-18 ENCOUNTER — Encounter: Payer: Self-pay | Admitting: Family Medicine

## 2017-11-18 ENCOUNTER — Ambulatory Visit (INDEPENDENT_AMBULATORY_CARE_PROVIDER_SITE_OTHER): Payer: Medicare Other | Admitting: Family Medicine

## 2017-11-18 VITALS — BP 125/50 | HR 74 | Temp 98.8°F | Resp 16 | Ht 60.0 in | Wt 148.0 lb

## 2017-11-18 DIAGNOSIS — R632 Polyphagia: Secondary | ICD-10-CM

## 2017-11-18 DIAGNOSIS — H00025 Hordeolum internum left lower eyelid: Secondary | ICD-10-CM | POA: Diagnosis not present

## 2017-11-18 MED ORDER — ERYTHROMYCIN 5 MG/GM OP OINT: 1.0000 "application " | TOPICAL_OINTMENT | Freq: Four times a day (QID) | OPHTHALMIC | 0 refills | Status: DC

## 2017-11-18 NOTE — Patient Instructions (Addendum)
Thank you for coming to the clinic today.  1. It looks like a Stye or "Hordeoleum" of your eyelid, this is a blocked duct infection, it may come back again if it does not fully resolve. If it develops a more hard or firm cyst, we consider it a "Chalazion" and it is less likely to resolve on it's own - Initial treatment is warm compresses up to 4 to 6 times a day using warm washcloth place over eyelid and apply gentle pressure, hold this on for 10-15 min at a time, can re-warm if cools down - Also given size and redness we will start topical eye antibiotic ointment with Erythromycin use small amount < 1 cm on q-tip, place inside lower eyelid, and move eye around. Use this up to 4 times a day for next 1-2 weeks, may stop if fully resolved after 1 week  It may drain pus and reduce in size, this is ideal, otherwise if significant worsening with spreading of redness or swelling onto eyelid or around face, unable to see, fevers/chills, then please return to clinic sooner or call, may go to Emergency Dept if significant worsening  Otherwise, if improving but not going away after 1-2 weeks, may return to Dr [facility]   Please schedule a follow-up appointment with Dr Althea CharonKaramalegos within 1-2 weeks if not resolved  If you have any other questions or concerns, please feel free to call the clinic or send a message through MyChart. You may also schedule an earlier appointment if necessary.  Additionally, you may be receiving a survey about your experience at our clinic within a few days to 1 week by e-mail or mail. We value your feedback.  Saralyn PilarAlexander Starsha Morning, DO [facility]

## 2017-11-18 NOTE — Progress Notes (Signed)
Subjective:    Patient ID: Paige House, female    DOB: Feb 21, 1934, 83 y.o.   MRN: [id number]  Paige House is a 83 y.o. female presenting on 11/18/2017 for eye infection (onset week painful warm compression used blurry vision when read left eye)   HPI   LEFT EYE STYE / Lower Eyelid Swelling Pain Reports symptoms acute onset 1 week ago with redness in the white of her eye and swelling and redness in lower part of her lower eyelid, only on left eye. Thought she had a "stye" felt a "bump" and it was painful. She states it felt like it was bleeding. She used warm compresses over several days with good results, swelling reduced pain improved but still present. Her eye appeared more normal. - Additional complaint with some "double vision" and blurry vision, thinks related to this problem but then states had similar problem in past with cataracts, she is followed by Dr [facility]ugh [facility] locally, and was referred to [facility] for cataract removal surgery but she ultimately declined - Also states with current issue she had similar problem, 5-6 years ago same eye Left eye had "hemorrhaging" and was told had "hematoma" given antibiotic eye drops, resolved - Denies any active eye pain at rest or with movement, loss of vision, fevers/chills, spreading redness, drainage of pus or bleeding, eyelid swelling, trauma or injury  Follow-up Mood / Anxiety / Binge Eating - She provides update today, after recent visit, she was changed from Fluoxetine to Sertraline due to insurance issue, but she never started Sertraline due to reading about side effects, she checked again with ins and able to get Fluoxetine covered, has resumed this 20mg  daily doing well, has meds no new concerns   Depression screen [facility] 2/9 10/09/2017 02/26/2017 08/14/2016  Decreased Interest 0 3 0  Down, Depressed, Hopeless 0 3 0  PHQ - 2 Score 0 6 0  Altered sleeping 3 3 -  Tired, decreased energy 0 3 -  Change in appetite 3 3 -    Feeling bad or failure about yourself  0 3 -  Trouble concentrating 0 3 -  Moving slowly or fidgety/restless 0 3 -  Suicidal thoughts 0 0 -  PHQ-9 Score 6 24 -  Difficult doing work/chores Not difficult at all Extremely dIfficult -    Social History   Tobacco Use  . Smoking status: Former Smoker    Start date: 07/26/1985  . Smokeless tobacco: Never Used  Substance Use Topics  . Alcohol use: No    Alcohol/week: 0.0 oz  . Drug use: No    Review of Systems Per HPI unless specifically indicated above     Objective:    BP (!) 125/50   Pulse 74   Temp 98.8 F (37.1 C) (Oral)   Resp 16   Ht 5' (1.524 m)   Wt 148 lb (67.1 kg)   BMI 28.90 kg/m   Wt Readings from Last 3 Encounters:  11/18/17 148 lb (67.1 kg)  10/09/17 149 lb (67.6 kg)  02/26/17 149 lb (67.6 kg)    Physical Exam  Constitutional: She is oriented to person, place, and time. She appears well-developed and well-nourished. No distress.  Well-appearing, comfortable, cooperative  HENT:  Head: Normocephalic and atraumatic.  Mouth/Throat: Oropharynx is clear and moist.  Eyes: Conjunctivae and EOM are normal. Pupils are equal, round, and reactive to light. Right eye exhibits no discharge. Left eye exhibits no discharge.  Bilateral eyes with cloudy lens appearance  consistent with cataracts. Undilated eye fundoscopic exam limited view also obstructed by cataract  Full range of eye movement without pain. EOMI  R eye is normal appearing, conjunctiva sclera. Normal eyelids without swelling erythema or pain.  L eye sclera and conjunctiva is normal without injection. - Left LOWER eyelid with internal hordeolum with raised punctate lesion vs pustule and swelling localized with surrounding local erythema, mild tender, without drainage or bleeding, no extending erythema into rest of eyelid or face. Upper eyelid unaffected   Cardiovascular: Normal rate.  Pulmonary/Chest: Effort normal.  Musculoskeletal: She exhibits no  edema.  Neurological: She is alert and oriented to person, place, and time.  Skin: Skin is warm and dry. No rash noted. She is not diaphoretic. No erythema.  Psychiatric: She has a normal mood and affect. Her behavior is normal.  Well groomed, good eye contact, normal speech and thoughts  Nursing note and vitals reviewed.     Results for orders placed or performed in visit on 07/27/15  Lipid Profile  Result Value Ref Range   Cholesterol, Total 244 (H) 100 - 199 mg/dL   Triglycerides 696127 0 - 149 mg/dL   HDL 56 >29>39 mg/dL   VLDL Cholesterol Cal 25 5 - 40 mg/dL   LDL Calculated 528163 (H) 0 - 99 mg/dL   Chol/HDL Ratio 4.4 0.0 - 4.4 ratio units  Comprehensive Metabolic Panel (CMET)  Result Value Ref Range   Glucose 99 65 - 99 mg/dL   BUN 15 8 - 27 mg/dL   Creatinine, Ser 4.131.06 (H) 0.57 - 1.00 mg/dL   GFR calc non Af Amer 50 (L) >59 mL/min/1.73   GFR calc Af Amer 58 (L) >59 mL/min/1.73   BUN/Creatinine Ratio 14 11 - 26   Sodium 142 134 - 144 mmol/L   Potassium 4.2 3.5 - 5.2 mmol/L   Chloride 103 97 - 108 mmol/L   CO2 24 18 - 29 mmol/L   Calcium 9.1 8.7 - 10.3 mg/dL   Total Protein 6.3 6.0 - 8.5 g/dL   Albumin 4.2 3.5 - 4.8 g/dL   Globulin, Total 2.1 1.5 - 4.5 g/dL   Albumin/Globulin Ratio 2.0 1.1 - 2.5   Bilirubin Total 1.2 0.0 - 1.2 mg/dL   Alkaline Phosphatase 67 39 - 117 IU/L   AST 19 0 - 40 IU/L   ALT 14 0 - 32 IU/L  CBC with Differential  Result Value Ref Range   WBC 6.7 3.4 - 10.8 x10E3/uL   RBC 4.84 3.77 - 5.28 x10E6/uL   Hemoglobin 14.0 11.1 - 15.9 g/dL   Hematocrit 24.442.2 01.034.0 - 46.6 %   MCV 87 79 - 97 fL   MCH 28.9 26.6 - 33.0 pg   MCHC 33.2 31.5 - 35.7 g/dL   RDW 27.214.4 53.612.3 - 64.415.4 %   Platelets 211 150 - 379 x10E3/uL   Neutrophils 59 %   Lymphs 31 %   Monocytes 6 %   Eos 3 %   Basos 1 %   Neutrophils Absolute 3.9 1.4 - 7.0 x10E3/uL   Lymphocytes Absolute 2.1 0.7 - 3.1 x10E3/uL   Monocytes Absolute 0.4 0.1 - 0.9 x10E3/uL   EOS (ABSOLUTE) 0.2 0.0 - 0.4 x10E3/uL     Basophils Absolute 0.1 0.0 - 0.2 x10E3/uL   Immature Granulocytes 0 %   Immature Grans (Abs) 0.0 0.0 - 0.1 x10E3/uL      Assessment & Plan:   Problem List Items Addressed This Visit    Hordeolum internum of left lower  eyelid - Primary  Acute L lower internal hordeolum, similar to prior episode 5-6 year ago possible recurrence. No evidence of complication, without conjunctivitis or extending eyelid/peri-orbital erythema or edema. - Improved on warm compresses - [facility]  Plan: 1. Continue moist warm compresses over Left eyelid 10-15 min at a time, up to 4-6 times daily until resolution 2. Start Erythromycin oph ointment Left eye up to 4 times daily up to 2 weeks, may stop after 1 week if resolved 3. Strict return precautions for spreading infection, otherwise if mild improvement but persistent hordeolum (or develops more hard chalazion) then asked to notify us and we can refer to Ophthalmology for I&D removal 4. Follow-up within 1-2 weeks as needed - return criteria given when to seek care at her established eye doctor, Dr Clydene [facility] especially if vision changes or not improved    Relevant Medications   erythromycin ophthalmic ointment    Binge-Eating / Anxiety / Depression Controlled, back on Fluoxetine 20mg  daily Never started Sertraline 25 due to reading side effects, this was changed alrdy through her ins and mail pharmacy    Meds ordered this encounter  Medications  . erythromycin ophthalmic ointment    Sig: Place 1 application into the left eye 4 (four) times daily. For 1-2 weeks until resolved    Dispense:  3.5 g    Refill:  0   Follow up plan: Return in about 2 weeks (around 12/02/2017), or if symptoms worsen or fail to improve.  Saralyn PilarAlexander Deaveon Schoen, DO [facility] 11/18/2017, 3:56 PM

## 2017-11-27 ENCOUNTER — Other Ambulatory Visit: Payer: Self-pay | Admitting: Family Medicine

## 2017-11-27 DIAGNOSIS — G2581 Restless legs syndrome: Secondary | ICD-10-CM

## 2017-12-04 ENCOUNTER — Ambulatory Visit (INDEPENDENT_AMBULATORY_CARE_PROVIDER_SITE_OTHER): Payer: Medicare Other | Admitting: Family Medicine

## 2017-12-04 ENCOUNTER — Encounter: Payer: Self-pay | Admitting: Family Medicine

## 2017-12-04 ENCOUNTER — Other Ambulatory Visit: Payer: Self-pay | Admitting: Family Medicine

## 2017-12-04 VITALS — BP 125/63 | HR 65 | Temp 98.5°F | Resp 16 | Ht 60.0 in | Wt 146.0 lb

## 2017-12-04 DIAGNOSIS — N183 Chronic kidney disease, stage 3 unspecified: Secondary | ICD-10-CM

## 2017-12-04 DIAGNOSIS — E559 Vitamin D deficiency, unspecified: Secondary | ICD-10-CM

## 2017-12-04 DIAGNOSIS — Z0000 Encounter for general adult medical examination without abnormal findings: Secondary | ICD-10-CM

## 2017-12-04 DIAGNOSIS — F419 Anxiety disorder, unspecified: Secondary | ICD-10-CM

## 2017-12-04 DIAGNOSIS — H00025 Hordeolum internum left lower eyelid: Secondary | ICD-10-CM | POA: Diagnosis not present

## 2017-12-04 DIAGNOSIS — E782 Mixed hyperlipidemia: Secondary | ICD-10-CM

## 2017-12-04 DIAGNOSIS — R7309 Other abnormal glucose: Secondary | ICD-10-CM

## 2017-12-04 MED ORDER — ERYTHROMYCIN 5 MG/GM OP OINT: 1.0000 "application " | TOPICAL_OINTMENT | Freq: Two times a day (BID) | OPHTHALMIC | 0 refills | Status: DC

## 2017-12-04 NOTE — Progress Notes (Signed)
Subjective:    Patient ID: Paige BinningShirley G Henkels, female    DOB: 01/03/1934, 83 y.o.   MRN: [id number]  Paige House is a 83 y.o. female presenting on 12/04/2017 for Eye Pain (follow up improve but still feels something stuck in eye in Left eye)   HPI   FOLLOW-UP LEFT EYE STYE / Lower Eyelid Swelling Pain - Last visit with me 11/18/17, for initial visit for same problem, treated with erythromycin eye ointment warm compresses and advise on close follow-up, see prior notes for background information. - Interval update with significant improvement overall, responded to ointment, now redness and swelling and pain are gone, only has some residual irritation - Today patient reports describes asymptomatic except when applying ointment in corner of eye, feels "one area of irritation" like "something stuck", otherwise does not bother her - Improved with warm compresses, uses occasionally still - She did not follow-up with Dr [facility]dene [facility], she did not want to return to their office at this time, but would consider going to other eye doctor office if need - She took a break from reading since last visit, and feels it helped, now ready to resume reading - Denies any redness, swelling, pain, loss of vision, nausea vomiting, fever chills, headaches, URI or other infection   [facility] 10/09/2017 02/26/2017 08/14/2016  Decreased Interest 0 3 0  Down, Depressed, Hopeless 0 3 0  PHQ - 2 Score 0 6 0  Altered sleeping 3 3 -  Tired, decreased energy 0 3 -  Change in appetite 3 3 -  Feeling bad or failure about yourself  0 3 -  Trouble concentrating 0 3 -  Moving slowly or fidgety/restless 0 3 -  Suicidal thoughts 0 0 -  PHQ-9 Score 6 24 -  Difficult doing work/chores Not difficult at all Extremely dIfficult -    Social History   Tobacco Use  . Smoking status: Former Smoker    Start date: 07/26/1985  . Smokeless tobacco: Never Used  Substance Use Topics  . Alcohol use: No   Alcohol/week: 0.0 oz  . Drug use: No    Review of Systems Per HPI unless specifically indicated above     Objective:    BP 125/63   Pulse 65   Temp 98.5 F (36.9 C) (Oral)   Resp 16   Ht 5' (1.524 m)   Wt 146 lb (66.2 kg)   BMI 28.51 kg/m   Wt Readings from Last 3 Encounters:  12/04/17 146 lb (66.2 kg)  11/18/17 148 lb (67.1 kg)  10/09/17 149 lb (67.6 kg)    Physical Exam  Constitutional: She is oriented to person, place, and time. She appears well-developed and well-nourished. No distress.  Well-appearing, comfortable, cooperative  HENT:  Head: Normocephalic and atraumatic.  Mouth/Throat: Oropharynx is clear and moist.  Eyes: Conjunctivae and EOM are normal. Pupils are equal, round, and reactive to light. Right eye exhibits no discharge. Left eye exhibits no discharge.  Full range of eye movement without pain. EOMI  R eye is normal appearing, conjunctiva sclera. Normal eyelids without swelling erythema or pain.  L eye sclera and conjunctiva is normal without injection, stable from last visit - Significantly improved Left lower eyelid internal hordeolum now with resolved edema, no more raised lesion, no evidence of cyst or pustule, non tender anymore on palpation, no drainage or bleeding. No extending erythema  Cardiovascular: Normal rate.  Pulmonary/Chest: Effort normal.  Musculoskeletal: She exhibits no edema.  Neurological: She  is alert and oriented to person, place, and time.  Skin: Skin is warm and dry. No rash noted. She is not diaphoretic. No erythema.  Psychiatric: She has a normal mood and affect. Her behavior is normal.  Well groomed, good eye contact, normal speech and thoughts  Nursing note and vitals reviewed.  Results for orders placed or performed in visit on 07/27/15  Lipid Profile  Result Value Ref Range   Cholesterol, Total 244 (H) 100 - 199 mg/dL   Triglycerides 478127 0 - 149 mg/dL   HDL 56 >29>39 mg/dL   VLDL Cholesterol Cal 25 5 - 40 mg/dL   LDL  Calculated 562163 (H) 0 - 99 mg/dL   Chol/HDL Ratio 4.4 0.0 - 4.4 ratio units  Comprehensive Metabolic Panel (CMET)  Result Value Ref Range   Glucose 99 65 - 99 mg/dL   BUN 15 8 - 27 mg/dL   Creatinine, Ser 1.301.06 (H) 0.57 - 1.00 mg/dL   GFR calc non Af Amer 50 (L) >59 mL/min/1.73   GFR calc Af Amer 58 (L) >59 mL/min/1.73   BUN/Creatinine Ratio 14 11 - 26   Sodium 142 134 - 144 mmol/L   Potassium 4.2 3.5 - 5.2 mmol/L   Chloride 103 97 - 108 mmol/L   CO2 24 18 - 29 mmol/L   Calcium 9.1 8.7 - 10.3 mg/dL   Total Protein 6.3 6.0 - 8.5 g/dL   Albumin 4.2 3.5 - 4.8 g/dL   Globulin, Total 2.1 1.5 - 4.5 g/dL   Albumin/Globulin Ratio 2.0 1.1 - 2.5   Bilirubin Total 1.2 0.0 - 1.2 mg/dL   Alkaline Phosphatase 67 39 - 117 IU/L   AST 19 0 - 40 IU/L   ALT 14 0 - 32 IU/L  CBC with Differential  Result Value Ref Range   WBC 6.7 3.4 - 10.8 x10E3/uL   RBC 4.84 3.77 - 5.28 x10E6/uL   Hemoglobin 14.0 11.1 - 15.9 g/dL   Hematocrit 86.542.2 78.434.0 - 46.6 %   MCV 87 79 - 97 fL   MCH 28.9 26.6 - 33.0 pg   MCHC 33.2 31.5 - 35.7 g/dL   RDW 69.614.4 29.512.3 - 28.415.4 %   Platelets 211 150 - 379 x10E3/uL   Neutrophils 59 %   Lymphs 31 %   Monocytes 6 %   Eos 3 %   Basos 1 %   Neutrophils Absolute 3.9 1.4 - 7.0 x10E3/uL   Lymphocytes Absolute 2.1 0.7 - 3.1 x10E3/uL   Monocytes Absolute 0.4 0.1 - 0.9 x10E3/uL   EOS (ABSOLUTE) 0.2 0.0 - 0.4 x10E3/uL   Basophils Absolute 0.1 0.0 - 0.2 x10E3/uL   Immature Granulocytes 0 %   Immature Grans (Abs) 0.0 0.0 - 0.1 x10E3/uL      Assessment & Plan:   Problem List Items Addressed This Visit    Hordeolum internum of left lower eyelid - Primary    Significantly improved No evidence infection No complications, seems hordeolum likely resolving. No evidence of Chalazion Will hold on checking fluorescein dye test today based on significantly improved exam and history not supportive  Plan: 1. Repeat 1-2 week course refill Erythromycin eye ointment with improve, now only BID not  QID - avoid provoking area 2. May continue warm compresses 3. May resume reading, try to be cautious avoid excessive eye strain 4. Return precautions reviewed, will refer to [facility] if need ophthalmology follow-up instead of returning to Dr [facility]dene [facility] per patient preference      Relevant Medications  erythromycin ophthalmic ointment      Meds ordered this encounter  Medications  . erythromycin ophthalmic ointment    Sig: Place 1 application into the left eye 2 (two) times daily. For 1-2 weeks until resolved    Dispense:  3.5 g    Refill:  0    Follow up plan: Return in about 2 weeks (around 12/18/2017), or if symptoms worsen or fail to improve, for stye.  Future labs already placed for January / February 2019.  Saralyn Pilar, DO [facility] 12/04/2017, 5:38 PM

## 2017-12-04 NOTE — Assessment & Plan Note (Signed)
Significantly improved No evidence infection No complications, seems hordeolum likely resolving. No evidence of Chalazion Will hold on checking fluorescein dye test today based on significantly improved exam and history not supportive  Plan: 1. Repeat 1-2 week course refill Erythromycin eye ointment with improve, now only BID not QID - avoid provoking area 2. May continue warm compresses 3. May resume reading, try to be cautious avoid excessive eye strain 4. Return precautions reviewed, will refer to [facility] if need ophthalmology follow-up instead of returning to Dr [facility]ene [facility] per patient preference

## 2017-12-04 NOTE — Patient Instructions (Addendum)
Thank you for coming to the office today.  1.  The area in your eye, most likely stye appears to be much better, potentially if this did not resolve and it may require a procedure or other evaluation by an Ophthalmologist eye doctor  Try with the Erythromycin ointment once more, only this time use it twice daily instead of 4 times  May continue warm compresses if helpful  I think the infection is gone  If not improved or worsen or any other concern related to this, call us back within 1-2 weeks, and we can refer you to [facility]    [facility] [address], [place], [place] [address] Phone: [phone] [email or web address]    If you have any other questions or concerns, please feel free to call the office or send a message through MyChart. You may also schedule an earlier appointment if necessary.  Additionally, you may be receiving a survey about your experience at our office within a few days to 1 week by e-mail or mail. We value your feedback.  DUE for FASTING BLOOD WORK (no food or drink after midnight before the lab appointment, only water or coffee without cream/sugar on the morning of)  SCHEDULE "Lab Only" visit in the morning at the clinic for lab draw in 3-6 WEEKS   - Make sure Lab Only appointment is at about 1 week before your next appointment, so that results will be available  For Lab Results, once available within 2-3 days of blood draw, you can can log in to MyChart online to view your results and a brief explanation. Also, we can discuss results at next follow-up visit.   Please schedule a Follow-up Appointment to: Return in about 2 weeks (around 12/18/2017), or if symptoms worsen or fail to improve, for stye.   If you have any other questions or concerns, please feel free to call the office or send a message through MyChart. You may also schedule an earlier appointment if necessary.  Additionally, you may be receiving a survey about your  experience at our clinic within a few days to 1 week by e-mail or mail. We value your feedback.  Saralyn PilarAlexander Divante Kotch, DO [facility]

## 2017-12-21 ENCOUNTER — Other Ambulatory Visit: Payer: Self-pay

## 2017-12-25 ENCOUNTER — Ambulatory Visit: Payer: Medicare Other | Admitting: Family Medicine

## 2018-01-01 ENCOUNTER — Encounter: Payer: Self-pay | Admitting: Family Medicine

## 2018-01-01 ENCOUNTER — Ambulatory Visit (INDEPENDENT_AMBULATORY_CARE_PROVIDER_SITE_OTHER): Payer: Medicare Other | Admitting: Family Medicine

## 2018-01-01 VITALS — BP 118/58 | HR 61 | Temp 98.2°F | Resp 16 | Ht 60.0 in | Wt 146.6 lb

## 2018-01-01 DIAGNOSIS — H04123 Dry eye syndrome of bilateral lacrimal glands: Secondary | ICD-10-CM | POA: Diagnosis not present

## 2018-01-01 DIAGNOSIS — N3281 Overactive bladder: Secondary | ICD-10-CM | POA: Diagnosis not present

## 2018-01-01 DIAGNOSIS — G609 Hereditary and idiopathic neuropathy, unspecified: Secondary | ICD-10-CM | POA: Diagnosis not present

## 2018-01-01 MED ORDER — OXYBUTYNIN CHLORIDE ER 10 MG PO TB24: 10.0000 mg | ORAL_TABLET | Freq: Every day | ORAL | 3 refills | Status: DC

## 2018-01-01 MED ORDER — GABAPENTIN 300 MG PO CAPS: ORAL_CAPSULE | ORAL | 3 refills | Status: DC

## 2018-01-01 NOTE — Patient Instructions (Addendum)
Thank you for coming to the office today.  1.  For overactive bladder, resume Oxybutynin  - may be cause of dry eyes too  Find out cost / coverage on these  Tolterodine (Detrol, Detrol LA) Solifenacin (Vesicare)   For legs may be a nerve compression or related to peripheral neuropathy, it seems less likely to be serious cause since it resolves promptly next day   Take FOUR capsules of Gabapentin at NIGHT to see if improves   NEUROLOGY  [facility] [address] [place], [place] [address] Phone: [phone]  Hemang K. Sherryll BurgerShah, MD  ------------------------------------  2. For Dry Eyes Consider Systane eye drops OTC or GenTeal brand, check the list, ask pharmacist for help  Future stronger rx is Restasis, often more expensive, [facility] if [facility] were [facility]  Please schedule a Follow-up Appointment to: Return in about 4 weeks (around 01/29/2018) for lab follow-up / neuropathy / dry eyes.    If you have any other questions or concerns, please feel free to call the office or send a message through MyChart. You may also schedule an earlier appointment if necessary.  Additionally, you may be receiving a survey about your experience at our office within a few days to 1 week by e-mail or mail. We value your feedback.  Saralyn PilarAlexander Bassheva Flury, DO [facility] [place] [facility]

## 2018-01-01 NOTE — Progress Notes (Signed)
Subjective:    Patient ID: Paige House, female    DOB: 10/08/1934, 84 y.o.   MRN: [id number]  Paige House is a 84 y.o. female presenting on 01/01/2018 for Leg Pain (as per patient leg pain when she goes to the bed at night onset 2 weeks) and Belepharitis (improved)  Patient presents for a same day appointment.  HPI   Bilateral Leg Heaviness / Numbness / History of Peripheral Neuropathy Reports new complaint now over past 2 weeks with new onset bilateral lower leg tingling and numbness and feeling of heaviness, more difficult to lift, usually only at night, will wake her up, then will resolve. It does not affect her during the day or with exertion. She is able to ambulate without onset of symptoms. - Previous history of neuropathy in both feet only, on Gabapentin 300mg  tabs (for dosing 2 AM / 2 afternoon / 3 PM - recently increased at prior visit) - Previously rx Requip for her lower extremity symptoms in December 2018, but she could not afford the copay and never got this med. - SHe was followed by Dr Sherryll BurgerShah [facility] back in 2017 for other evaluation of confusion and cognitive, had MRI and other testing, but she has not returned to him for neuropathy - Denies any leg pain, calf pain, redness, swelling, asymmetry, constant numbness tingling or weakness  FOLLOW-UP Left Eye Stye / Blephiritis - Last visit 12/5 and 12/21 for same problem, treated with rx antibiotic erythromycin eye ointment and conservative care - Today reports symptom has resolved, no further redness, irritation or swelling - Her vision is normal - She never went to eye doctor - Denies loss of vision, redness, swelling, fever  Dry Eyes Reports that recently related to prior eye condition, seems to have more eye fatigue and dry eyes, she cannot read as much as she could before due to this, often has to blink and has irritation from dry eyes. - Asking about rx hydration eye drops.  OAB She never started  Myrbetriq, could not afford due to copay. Requested resume Oxybutynin   [facility] 10/09/2017 02/26/2017 08/14/2016  Decreased Interest 0 3 0  Down, Depressed, Hopeless 0 3 0  PHQ - 2 Score 0 6 0  Altered sleeping 3 3 -  Tired, decreased energy 0 3 -  Change in appetite 3 3 -  Feeling bad or failure about yourself  0 3 -  Trouble concentrating 0 3 -  Moving slowly or fidgety/restless 0 3 -  Suicidal thoughts 0 0 -  PHQ-9 Score 6 24 -  Difficult doing work/chores Not difficult at all Extremely dIfficult -    Social History   Tobacco Use  . Smoking status: Former Smoker    Start date: 07/26/1985  . Smokeless tobacco: Never Used  Substance Use Topics  . Alcohol use: No    Alcohol/week: 0.0 oz  . Drug use: No    Review of Systems Per HPI unless specifically indicated above     Objective:    BP (!) 118/58   Pulse 61   Temp 98.2 F (36.8 C) (Oral)   Resp 16   Ht 5' (1.524 m)   Wt 146 lb 9.6 oz (66.5 kg)   BMI 28.63 kg/m   Wt Readings from Last 3 Encounters:  01/01/18 146 lb 9.6 oz (66.5 kg)  12/04/17 146 lb (66.2 kg)  11/18/17 148 lb (67.1 kg)    Physical Exam  Constitutional: She is oriented  to person, place, and time. She appears well-developed and well-nourished. No distress.  Well-appearing, comfortable, cooperative  HENT:  Head: Normocephalic and atraumatic.  Mouth/Throat: Oropharynx is clear and moist.  Eyes: Conjunctivae and EOM are normal. Pupils are equal, round, and reactive to light. Right eye exhibits no discharge. Left eye exhibits no discharge.  L eye sclera and conjunctiva is normal without injection, stable from last visit  Resolved lower eyelid internal hordeolum. No periorbital edema or erythema.  Slightly dry appearance to bilateral conjunctiva of eyes  Neck: Normal range of motion. Neck supple.  Cardiovascular: Normal rate, regular rhythm, normal heart sounds and intact distal pulses.  No murmur heard. Pulmonary/Chest: Effort  normal and breath sounds normal. No respiratory distress. She has no wheezes. She has no rales.  Musculoskeletal: Normal range of motion. She exhibits no edema.  Lower extremities normal and symmetrical No muscle hypertonicity or cramp No calve asymmetry  Lymphadenopathy:    She has no cervical adenopathy.  Neurological: She is alert and oriented to person, place, and time.  Distal sensation to lower extremities intact to light touch, symmetrical  Monofilament testing bilateral feet and lower extremity intact as well  Skin: Skin is warm and dry. No rash noted. She is not diaphoretic. No erythema.  Lower extremities normal skin appearance without dry skin or color change  Warm skin  Psychiatric: She has a normal mood and affect. Her behavior is normal.  Well groomed, good eye contact, normal speech and thoughts  Nursing note and vitals reviewed.  Results for orders placed or performed in visit on 07/27/15  Lipid Profile  Result Value Ref Range   Cholesterol, Total 244 (H) 100 - 199 mg/dL   Triglycerides 960 0 - 149 mg/dL   HDL 56 >45 mg/dL   VLDL Cholesterol Cal 25 5 - 40 mg/dL   LDL Calculated 409 (H) 0 - 99 mg/dL   Chol/HDL Ratio 4.4 0.0 - 4.4 ratio units  Comprehensive Metabolic Panel (CMET)  Result Value Ref Range   Glucose 99 65 - 99 mg/dL   BUN 15 8 - 27 mg/dL   Creatinine, Ser 8.11 (H) 0.57 - 1.00 mg/dL   GFR calc non Af Amer 50 (L) >59 mL/min/1.73   GFR calc Af Amer 58 (L) >59 mL/min/1.73   BUN/Creatinine Ratio 14 11 - 26   Sodium 142 134 - 144 mmol/L   Potassium 4.2 3.5 - 5.2 mmol/L   Chloride 103 97 - 108 mmol/L   CO2 24 18 - 29 mmol/L   Calcium 9.1 8.7 - 10.3 mg/dL   Total Protein 6.3 6.0 - 8.5 g/dL   Albumin 4.2 3.5 - 4.8 g/dL   Globulin, Total 2.1 1.5 - 4.5 g/dL   Albumin/Globulin Ratio 2.0 1.1 - 2.5   Bilirubin Total 1.2 0.0 - 1.2 mg/dL   Alkaline Phosphatase 67 39 - 117 IU/L   AST 19 0 - 40 IU/L   ALT 14 0 - 32 IU/L  CBC with Differential  Result Value  Ref Range   WBC 6.7 3.4 - 10.8 x10E3/uL   RBC 4.84 3.77 - 5.28 x10E6/uL   Hemoglobin 14.0 11.1 - 15.9 g/dL   Hematocrit 91.4 78.2 - 46.6 %   MCV 87 79 - 97 fL   MCH 28.9 26.6 - 33.0 pg   MCHC 33.2 31.5 - 35.7 g/dL   RDW 95.6 21.3 - 08.6 %   Platelets 211 150 - 379 x10E3/uL   Neutrophils 59 %   Lymphs 31 %  Monocytes 6 %   Eos 3 %   Basos 1 %   Neutrophils Absolute 3.9 1.4 - 7.0 x10E3/uL   Lymphocytes Absolute 2.1 0.7 - 3.1 x10E3/uL   Monocytes Absolute 0.4 0.1 - 0.9 x10E3/uL   EOS (ABSOLUTE) 0.2 0.0 - 0.4 x10E3/uL   Basophils Absolute 0.1 0.0 - 0.2 x10E3/uL   Immature Granulocytes 0 %   Immature Grans (Abs) 0.0 0.0 - 0.1 x10E3/uL      Assessment & Plan:   Problem List Items Addressed This Visit    Neuropathy, peripheral - Primary    Chronic problem, now some worsening involving lower legs, uncertain of this is related to actual neuropathy or if more nerve compression/positional with paresthesia that is only temporary Followed by Dr Shari Prows [facility] - no longer following, for neuropathy Exam is benign and normal, history does not suggest claudication either vascular or neurogenic, no sign of DVT or other vascular  Plan 1. Continue to titrate up Gabapentin 600mg  / 600mg  / 1200mg  now for 4 caps at bedtime. New rx sent to [facility], she had problem receiving last time we changed dose to pharmacy they sent her old rx 2. She could not afford Requip for presumed RLS symptoms - discontinued 3. If not improving then advised should contact Dr Shari Prows [facility] for eval and NCS      Relevant Medications   gabapentin (NEURONTIN) 300 MG capsule   OAB (overactive bladder)    Resume Oxybutynin for PRN use OAB, despite risk of side effects, may be contributing to dry mouth/eyes - Unable to afford Myrbetriq - She did not notify office and we had denied her refill request for Oxybutynin since it was discontinued - New rx sent to [facility], follow-up as needed - advised few other meds of same class that  may be more effective for her, but she will check cost      Relevant Medications   oxybutynin (DITROPAN-XL) 10 MG 24 hr tablet    Other Visit Diagnoses    Dry eye syndrome of both eyes     - Recommended OTC lubricant eye drops - Follow-up with Optometry if needed       Meds ordered this encounter  Medications  . oxybutynin (DITROPAN-XL) 10 MG 24 hr tablet    Sig: Take 1 tablet (10 mg total) by mouth at bedtime.    Dispense:  90 tablet    Refill:  3    Restart since could not afford cost of Myrbetriq  . DISCONTD: gabapentin (NEURONTIN) 300 MG capsule    Sig: Take 2 capsules (600mg ) in morning, take 2 (600mg ) in afternoon, and take 3 (900mg ) at bedtime.    Dispense:  630 capsule    Refill:  3  . gabapentin (NEURONTIN) 300 MG capsule    Sig: Take 2 capsules (600mg ) in morning, take 2 (600mg ) in afternoon, and take 4 (1200mg ) at bedtime.    Dispense:  720 capsule    Refill:  3    Dose change, please disregard last rx with 3 capsules at bedtime    Follow up plan: Return in about 4 weeks (around 01/29/2018) for lab follow-up / neuropathy / dry eyes.  Saralyn Pilar, DO [facility] 01/02/2018, 10:14 AM

## 2018-01-02 ENCOUNTER — Encounter: Payer: Self-pay | Admitting: Family Medicine

## 2018-01-02 NOTE — Assessment & Plan Note (Signed)
Resume Oxybutynin for PRN use OAB, despite risk of side effects, may be contributing to dry mouth/eyes - Unable to afford Myrbetriq - She did not notify office and we had denied her refill request for Oxybutynin since it was discontinued - New rx sent to [facility], follow-up as needed - advised few other meds of same class that may be more effective for her, but she will check cost

## 2018-01-02 NOTE — Assessment & Plan Note (Signed)
Chronic problem, now some worsening involving lower legs, uncertain of this is related to actual neuropathy or if more nerve compression/positional with paresthesia that is only temporary Followed by Dr Shari ProwsShah [facility] - no longer following, for neuropathy Exam is benign and normal, history does not suggest claudication either vascular or neurogenic, no sign of DVT or other vascular  Plan 1. Continue to titrate up Gabapentin 600mg  / 600mg  / 1200mg  now for 4 caps at bedtime. New rx sent to [facility], she had problem receiving last time we changed dose to pharmacy they sent her old rx 2. She could not afford Requip for presumed RLS symptoms - discontinued 3. If not improving then advised should contact Dr Shari ProwsShah [facility] for eval and NCS

## 2018-01-05 ENCOUNTER — Other Ambulatory Visit: Payer: Self-pay | Admitting: Family Medicine

## 2018-01-05 DIAGNOSIS — N3281 Overactive bladder: Secondary | ICD-10-CM

## 2018-01-06 ENCOUNTER — Telehealth: Payer: Self-pay | Admitting: Family Medicine

## 2018-01-06 NOTE — Telephone Encounter (Signed)
Pt advised about change of Rx and notified pharmacy.

## 2018-01-06 NOTE — Telephone Encounter (Signed)
Pt said her insurance will not pay for oxybutynin 10 mg xl but if a new prescription for 5 mg (no xl) is sent, it will be covered.  Her call back number is [phone]

## 2018-03-02 ENCOUNTER — Other Ambulatory Visit: Payer: Self-pay

## 2018-03-02 MED ORDER — LISINOPRIL 5 MG PO TABS: 5.0000 mg | ORAL_TABLET | Freq: Every day | ORAL | 1 refills | Status: DC

## 2018-04-21 ENCOUNTER — Other Ambulatory Visit: Payer: Self-pay

## 2018-04-21 ENCOUNTER — Other Ambulatory Visit: Payer: Self-pay | Admitting: Gastroenterology

## 2018-04-21 DIAGNOSIS — K589 Irritable bowel syndrome without diarrhea: Secondary | ICD-10-CM

## 2018-04-21 NOTE — Telephone Encounter (Signed)
Please call a refill into [facility] in [place] for Lomotil 2.5 mg.

## 2018-05-21 ENCOUNTER — Telehealth: Payer: Self-pay | Admitting: Gastroenterology

## 2018-05-21 NOTE — Telephone Encounter (Signed)
Pt is calling for Ginger she is Taking rx Diphenoxylate/ atropina  And she would like to know if Dr. Servando SnareWohl would change Dosesage she is taking 4 now she would like to take more then that please call pt

## 2018-05-21 NOTE — Telephone Encounter (Signed)
Left vm for pt to call office and schedule apt with Dr. Servando SnareWohl rx fu

## 2018-05-21 NOTE — Telephone Encounter (Signed)
See message from Dr. Servando SnareWohl below. Please schedule a follow up appt.

## 2018-05-21 NOTE — Telephone Encounter (Signed)
Please okay for this pt to up her dose on the lomotil. See message below.

## 2018-05-21 NOTE — Telephone Encounter (Signed)
It appears the patient was last seen in February of 2017. If the meds are not working then the patient should be seen to address the change.

## 2018-07-21 ENCOUNTER — Other Ambulatory Visit: Payer: Self-pay | Admitting: Family Medicine

## 2018-08-03 ENCOUNTER — Ambulatory Visit (INDEPENDENT_AMBULATORY_CARE_PROVIDER_SITE_OTHER): Payer: Medicare Other | Admitting: Family Medicine

## 2018-08-03 ENCOUNTER — Encounter: Payer: Self-pay | Admitting: Family Medicine

## 2018-08-03 VITALS — BP 146/66 | HR 62 | Temp 98.4°F | Resp 16 | Ht 60.0 in | Wt 140.0 lb

## 2018-08-03 DIAGNOSIS — J3089 Other allergic rhinitis: Secondary | ICD-10-CM

## 2018-08-03 DIAGNOSIS — J0110 Acute frontal sinusitis, unspecified: Secondary | ICD-10-CM

## 2018-08-03 DIAGNOSIS — H8111 Benign paroxysmal vertigo, right ear: Secondary | ICD-10-CM | POA: Diagnosis not present

## 2018-08-03 MED ORDER — AMOXICILLIN-POT CLAVULANATE 875-125 MG PO TABS: 1.0000 | ORAL_TABLET | Freq: Two times a day (BID) | ORAL | 0 refills | Status: DC

## 2018-08-03 MED ORDER — MECLIZINE HCL 25 MG PO TABS: 25.0000 mg | ORAL_TABLET | Freq: Three times a day (TID) | ORAL | 2 refills | Status: DC | PRN

## 2018-08-03 NOTE — Progress Notes (Signed)
Subjective:    Patient ID: Paige BinningShirley G House, female    DOB: 11/20/1934, 84 y.o.   MRN: [id number]  Paige House is a 84 y.o. female presenting on 08/03/2018 for Dizziness (sinusitis, sore throat and gets dizzy when trying to change position from sitting and standing)   HPI   Acute Sinusitis / Right BPPV Reports symptoms over past 5 days with onset initially dizziness vertigo symptoms and sinus pressure. Onset woke up few days ago felt dizziness and room spinning when she got up in AM, and has recurrent episodes of room spinning and vertigo if sudden quick movements of head, and she has been more cautious now. Additionally describes worsening sinus symptoms with pressure and headache. - Reviews similar history about 4 years ago, she described "bad sinus" infection and she was treated by prior PCP with Meclizine PRN, and she was [facility] after vertigo testing. She was given rx Meclizine - Taking Zyrtec 10mg  OTC for past 5 days. She takes - Additionally elevated BP today concerning patient, no prior dx HTN. She is taking Lisinopril 5mg  daily for kidney health. She did not have issues with worse dizziness on position changes with orthostatic vitals today - Admits headache, ears full and pressure, scratchy throat, itchy eyes, congestion Denies fever or chills, nausea vomiting, lower extremity edema, fall or syncope, chest pain or dyspnea  Health Maintenance: Due for Flu shot will return  Additionally she did not follow-up with routine labs and office visit for preventative care as advised earlier in year. She will try to re-schedule.  [facility] 2/9 08/03/2018 10/09/2017 02/26/2017  Decreased Interest 0 0 3  Down, Depressed, Hopeless 0 0 3  PHQ - 2 Score 0 0 6  Altered sleeping - 3 3  Tired, decreased energy - 0 3  Change in appetite - 3 3  Feeling bad or failure about yourself  - 0 3  Trouble concentrating - 0 3  Moving slowly or fidgety/restless - 0 3    Suicidal thoughts - 0 0  PHQ-9 Score - 6 24  Difficult doing work/chores - Not difficult at all Extremely dIfficult    Social History   Tobacco Use  . Smoking status: Former Smoker    Start date: 07/26/1985  . Smokeless tobacco: Former Engineer, waterUser  Substance Use Topics  . Alcohol use: No    Alcohol/week: 0.0 standard drinks  . Drug use: No    Review of Systems Per HPI unless specifically indicated above     Objective:      Orthostatic VS for the past 24 hrs (Last 3 readings):  BP- Lying Pulse- Lying BP- Sitting Pulse- Sitting BP- Standing at 3 minutes Pulse- Standing at 3 minutes  08/03/18 1335 159/65 57 146/66 62 148/71 66  08/03/18 1320 159/65 57 - - - -    BP (!) 146/66   Pulse 62   Temp 98.4 F (36.9 C) (Oral)   Resp 16   Ht 5' (1.524 m)   Wt 140 lb (63.5 kg)   SpO2 97%   BMI 27.34 kg/m   Wt Readings from Last 3 Encounters:  08/03/18 140 lb (63.5 kg)  01/01/18 146 lb 9.6 oz (66.5 kg)  12/04/17 146 lb (66.2 kg)    Physical Exam  Constitutional: She is oriented to person, place, and time. She appears well-developed and well-nourished. No distress.  Well-appearing, comfortable, cooperative  HENT:  Head: Normocephalic and atraumatic.  Mouth/Throat: Oropharynx is clear and moist.  Frontal  sinuses mild tender R>L. Nares patent with some turbinate edema and mild congestion without purulence.   R TM obscured by cerumen. L TM with cloudy opaque effusion seems consistent with effusion without erythema or bulging.  Oropharynx clear without erythema, exudates, edema or asymmetry.  Eyes: Conjunctivae are normal. Right eye exhibits no discharge. Left eye exhibits no discharge.  Neck: Normal range of motion. Neck supple. No thyromegaly present.  Cardiovascular: Normal rate, regular rhythm, normal heart sounds and intact distal pulses.  No murmur heard. Pulmonary/Chest: Effort normal and breath sounds normal. No respiratory distress. She has no wheezes. She has no rales.   Musculoskeletal: Normal range of motion. She exhibits no edema.  Lymphadenopathy:    She has no cervical adenopathy.  Neurological: She is alert and oriented to person, place, and time.  Skin: Skin is warm and dry. No rash noted. She is not diaphoretic. No erythema.  Psychiatric: She has a normal mood and affect. Her behavior is normal.  Well groomed, good eye contact, normal speech and thoughts  Nursing note and vitals reviewed.  Results for orders placed or performed in visit on 07/27/15  Lipid Profile  Result Value Ref Range   Cholesterol, Total 244 (H) 100 - 199 mg/dL   Triglycerides 161127 0 - 149 mg/dL   HDL 56 >09>39 mg/dL   VLDL Cholesterol Cal 25 5 - 40 mg/dL   LDL Calculated 604163 (H) 0 - 99 mg/dL   Chol/HDL Ratio 4.4 0.0 - 4.4 ratio units  Comprehensive Metabolic Panel (CMET)  Result Value Ref Range   Glucose 99 65 - 99 mg/dL   BUN 15 8 - 27 mg/dL   Creatinine, Ser 5.401.06 (H) 0.57 - 1.00 mg/dL   GFR calc non Af Amer 50 (L) >59 mL/min/1.73   GFR calc Af Amer 58 (L) >59 mL/min/1.73   BUN/Creatinine Ratio 14 11 - 26   Sodium 142 134 - 144 mmol/L   Potassium 4.2 3.5 - 5.2 mmol/L   Chloride 103 97 - 108 mmol/L   CO2 24 18 - 29 mmol/L   Calcium 9.1 8.7 - 10.3 mg/dL   Total Protein 6.3 6.0 - 8.5 g/dL   Albumin 4.2 3.5 - 4.8 g/dL   Globulin, Total 2.1 1.5 - 4.5 g/dL   Albumin/Globulin Ratio 2.0 1.1 - 2.5   Bilirubin Total 1.2 0.0 - 1.2 mg/dL   Alkaline Phosphatase 67 39 - 117 IU/L   AST 19 0 - 40 IU/L   ALT 14 0 - 32 IU/L  CBC with Differential  Result Value Ref Range   WBC 6.7 3.4 - 10.8 x10E3/uL   RBC 4.84 3.77 - 5.28 x10E6/uL   Hemoglobin 14.0 11.1 - 15.9 g/dL   Hematocrit 98.142.2 19.134.0 - 46.6 %   MCV 87 79 - 97 fL   MCH 28.9 26.6 - 33.0 pg   MCHC 33.2 31.5 - 35.7 g/dL   RDW 47.814.4 29.512.3 - 62.115.4 %   Platelets 211 150 - 379 x10E3/uL   Neutrophils 59 %   Lymphs 31 %   Monocytes 6 %   Eos 3 %   Basos 1 %   Neutrophils Absolute 3.9 1.4 - 7.0 x10E3/uL   Lymphocytes Absolute  2.1 0.7 - 3.1 x10E3/uL   Monocytes Absolute 0.4 0.1 - 0.9 x10E3/uL   EOS (ABSOLUTE) 0.2 0.0 - 0.4 x10E3/uL   Basophils Absolute 0.1 0.0 - 0.2 x10E3/uL   Immature Granulocytes 0 %   Immature Grans (Abs) 0.0 0.0 - 0.1 x10E3/uL  Assessment & Plan:   Problem List Items Addressed This Visit    Allergic rhinitis   BPPV (benign paroxysmal positional vertigo), right  Suspected R-BPPV by history supported by exam but did not proceed with Dix-hallpike - No other significant neurological findings or focal deficits Likely underlying sinusitis contributing  Plan: 1. Handout given with Epley maneuver TID for 1-2 weeks until resolved 2. Rx meclizine PRN for breakthrough symptoms - reviewed med risks and not may not resolve problem, will proceed with rx since effective for her in past 3. Return criteria, if not improved consider vestibular PT referral      Relevant Medications   meclizine (ANTIVERT) 25 MG tablet    Other Visit Diagnoses    Acute non-recurrent frontal sinusitis    -  Primary   Relevant Medications   cetirizine (ZYRTEC) 10 MG tablet   amoxicillin-clavulanate (AUGMENTIN) 875-125 MG tablet      Consistent with acute frontal acute sinusitis, likely initially viral URI vs allergic rhinitis component with worsening concern for bacterial infection given constellation of symptoms and associated BPPV similar with prior sinus infection in past required antibiotic  Plan: 1. Reassurance, still likely to be self-limited  - however given prior history - will agree to provide precautionary antibiotic Augmentin BID x 10 days only fill within 24-48 hours if not improved or worsening criteria symptoms 2. Continue OTC Zyrtec 10mg  daily and Flonase 2 sprays in each nostril daily for next 4-6 weeks, then may stop and use seasonally or as needed 3. Supportive care with nasal saline OTC, hydration 4. Return criteria reviewed    Meds ordered this encounter  Medications  .  amoxicillin-clavulanate (AUGMENTIN) 875-125 MG tablet    Sig: Take 1 tablet by mouth 2 (two) times daily. For 10 days - only fill if not improved 24-48 hours    Dispense:  20 tablet    Refill:  0  . meclizine (ANTIVERT) 25 MG tablet    Sig: Take 1 tablet (25 mg total) by mouth 3 (three) times daily as needed for dizziness.    Dispense:  30 tablet    Refill:  2    Follow up plan: Return in about 2 weeks (around 08/17/2018), or if symptoms worsen or fail to improve, for sinusitis vertigo / also schedule future fasting lab only then 1 week later [employer] .  Future labs were previously ordered - will update lab orders for her to schedule anticipated lab only and yearly check up in next few weeks to months.  Saralyn Pilar, DO [facility] 08/03/2018, 2:09 PM

## 2018-08-03 NOTE — Patient Instructions (Addendum)
Thank you for coming to the office today.  We are treating you for sinus infection - Continue Zyrtec, Flonase - May start Antibiotic Augmentin if not improved  You have symptoms of Vertigo (Benign Paroxysmal Positional Vertigo) - This is commonly caused by inner ear fluid imbalance, sometimes can be worsened by allergies and sinus symptoms, otherwise it can occur randomly sometimes and we may never discover the exact cause. - To treat this, try the Epley Manuever (see diagrams/instructions below) at home up to 3 times a day for 1-2 weeks or until symptoms resolve - You may take Meclizine as needed up to 3 times a day for dizziness, this will not cure symptoms but may help. Caution may make you drowsy.  If you develop significant worsening episode with vertigo that does not improve and you get severe headache, loss of vision, arm or leg weakness, slurred speech, or other concerning symptoms please seek immediate medical attention at Emergency Department.  Please schedule a follow-up appointment with Dr Althea CharonKaramalegos within 4 weeks if Vertigo not improving, and will consider Referral to Vestibular Rehab  See the next page for images describing the Epley Manuever.     ----------------------------------------------------------------------------------------------------------------------       DUE for FASTING BLOOD WORK (no food or drink after midnight before the lab appointment, only water or coffee without cream/sugar on the morning of)  SCHEDULE "Lab Only" visit in the morning at the clinic for lab draw in 6 weeks to 3 months   - Make sure Lab Only appointment is at about 1 week before your next appointment, so that results will be available   Please schedule a Follow-up Appointment to: Return in about 2 weeks (around 08/17/2018), or if symptoms worsen or fail to improve, for sinusitis vertigo / also schedule future fasting lab only then 1 week later [facility] .  If you  have any other questions or concerns, please feel free to call the office or send a message through MyChart. You may also schedule an earlier appointment if necessary.  Additionally, you may be receiving a survey about your experience at our office within a few days to 1 week by e-mail or mail. We value your feedback.  Saralyn PilarAlexander Oluwaferanmi Wain, DO [facility]

## 2018-08-04 ENCOUNTER — Other Ambulatory Visit: Payer: Self-pay | Admitting: Family Medicine

## 2018-08-04 DIAGNOSIS — F331 Major depressive disorder, recurrent, moderate: Secondary | ICD-10-CM

## 2018-08-04 DIAGNOSIS — R632 Polyphagia: Secondary | ICD-10-CM

## 2018-08-04 DIAGNOSIS — N183 Chronic kidney disease, stage 3 unspecified: Secondary | ICD-10-CM

## 2018-08-04 DIAGNOSIS — G2581 Restless legs syndrome: Secondary | ICD-10-CM

## 2018-08-04 DIAGNOSIS — R799 Abnormal finding of blood chemistry, unspecified: Secondary | ICD-10-CM

## 2018-08-04 DIAGNOSIS — E559 Vitamin D deficiency, unspecified: Secondary | ICD-10-CM

## 2018-08-04 DIAGNOSIS — E538 Deficiency of other specified B group vitamins: Secondary | ICD-10-CM

## 2018-08-04 DIAGNOSIS — E782 Mixed hyperlipidemia: Secondary | ICD-10-CM

## 2018-08-10 ENCOUNTER — Telehealth: Payer: Self-pay | Admitting: Family Medicine

## 2018-08-10 DIAGNOSIS — J3089 Other allergic rhinitis: Secondary | ICD-10-CM

## 2018-08-10 NOTE — Telephone Encounter (Signed)
I saw her on 8/20, gave her augmentin for 10 days. She should still have 3 more days of antibiotic to take.  I would not recommend repeating or prolonging antibiotic course given her symptoms.  It sounds like she is describing mostly allergy symptoms.  She should continue with current treatment. If not improved, I would be able to offer generic Singulair 10mg  nightly to help allergy and sinus symptoms, this can be taken regularly for weeks to months as long as it is needed.  Saralyn PilarAlexander Karamalegos, DO [facility] 08/10/2018, 6:15 PM

## 2018-08-10 NOTE — Telephone Encounter (Signed)
Pt said vertigo is almost gone but she still has sinus headache, watery eyes, clogged ears.  She asked to ge a refill on antibiotic [phone]

## 2018-08-11 ENCOUNTER — Other Ambulatory Visit: Payer: Self-pay | Admitting: Family Medicine

## 2018-08-11 DIAGNOSIS — J3089 Other allergic rhinitis: Secondary | ICD-10-CM

## 2018-08-11 MED ORDER — MONTELUKAST SODIUM 10 MG PO TABS: 10.0000 mg | ORAL_TABLET | Freq: Every day | ORAL | 1 refills | Status: DC

## 2018-08-11 NOTE — Telephone Encounter (Signed)
Reviewed update w/ Elvina MattesNishaben Patel CMA after she called patient. Sent rx [facility]ulair to [facility], if effective we can send 90 day to [facility].  Saralyn PilarAlexander Karamalegos, DO [facility] 08/11/2018, 8:44 AM

## 2018-08-11 NOTE — Addendum Note (Signed)
Addended by: Smitty CordsKARAMALEGOS, ALEXANDER J on: 08/11/2018 08:44 AM   Modules accepted: Orders

## 2018-09-06 ENCOUNTER — Other Ambulatory Visit: Payer: Self-pay | Admitting: Family Medicine

## 2018-09-06 DIAGNOSIS — J3089 Other allergic rhinitis: Secondary | ICD-10-CM

## 2018-09-06 MED ORDER — MONTELUKAST SODIUM 10 MG PO TABS: 10.0000 mg | ORAL_TABLET | Freq: Every day | ORAL | 3 refills | Status: DC

## 2018-09-22 ENCOUNTER — Other Ambulatory Visit: Payer: Self-pay | Admitting: Family Medicine

## 2018-09-22 DIAGNOSIS — F5081 Binge eating disorder: Secondary | ICD-10-CM

## 2018-09-22 DIAGNOSIS — F331 Major depressive disorder, recurrent, moderate: Secondary | ICD-10-CM

## 2018-09-29 ENCOUNTER — Other Ambulatory Visit: Payer: Self-pay | Admitting: Family Medicine

## 2018-09-29 DIAGNOSIS — N3281 Overactive bladder: Secondary | ICD-10-CM

## 2018-09-29 DIAGNOSIS — G609 Hereditary and idiopathic neuropathy, unspecified: Secondary | ICD-10-CM

## 2018-09-29 DIAGNOSIS — G2581 Restless legs syndrome: Secondary | ICD-10-CM

## 2018-09-29 DIAGNOSIS — E782 Mixed hyperlipidemia: Secondary | ICD-10-CM

## 2018-09-29 DIAGNOSIS — F331 Major depressive disorder, recurrent, moderate: Secondary | ICD-10-CM

## 2018-10-06 ENCOUNTER — Ambulatory Visit: Payer: Medicare Other | Admitting: Family Medicine

## 2018-10-22 ENCOUNTER — Other Ambulatory Visit: Payer: Self-pay | Admitting: Family Medicine

## 2018-10-22 DIAGNOSIS — F331 Major depressive disorder, recurrent, moderate: Secondary | ICD-10-CM

## 2018-10-22 DIAGNOSIS — F5081 Binge eating disorder: Secondary | ICD-10-CM

## 2018-10-22 DIAGNOSIS — F50819 Binge eating disorder, unspecified: Secondary | ICD-10-CM

## 2018-11-22 ENCOUNTER — Other Ambulatory Visit: Payer: Self-pay

## 2018-11-22 DIAGNOSIS — K589 Irritable bowel syndrome without diarrhea: Secondary | ICD-10-CM

## 2018-11-22 MED ORDER — DIPHENOXYLATE-ATROPINE 2.5-0.025 MG PO TABS: ORAL_TABLET | ORAL | 5 refills | Status: DC

## 2018-11-24 ENCOUNTER — Ambulatory Visit: Payer: Medicare Other

## 2018-12-17 ENCOUNTER — Ambulatory Visit (INDEPENDENT_AMBULATORY_CARE_PROVIDER_SITE_OTHER): Payer: Medicare Other | Admitting: Family Medicine

## 2018-12-17 ENCOUNTER — Encounter: Payer: Self-pay | Admitting: Family Medicine

## 2018-12-17 VITALS — BP 136/60 | HR 60 | Temp 98.6°F | Resp 16 | Ht 60.0 in

## 2018-12-17 DIAGNOSIS — R52 Pain, unspecified: Secondary | ICD-10-CM | POA: Diagnosis not present

## 2018-12-17 DIAGNOSIS — J0100 Acute maxillary sinusitis, unspecified: Secondary | ICD-10-CM

## 2018-12-17 LAB — POCT INFLUENZA A/B
Influenza A, POC: NEGATIVE
Influenza B, POC: NEGATIVE

## 2018-12-17 MED ORDER — AMOXICILLIN-POT CLAVULANATE 875-125 MG PO TABS: 1.0000 | ORAL_TABLET | Freq: Two times a day (BID) | ORAL | 0 refills | Status: DC

## 2018-12-17 MED ORDER — BENZONATATE 100 MG PO CAPS: 100.0000 mg | ORAL_CAPSULE | Freq: Three times a day (TID) | ORAL | 0 refills | Status: DC | PRN

## 2018-12-17 NOTE — Patient Instructions (Addendum)
Thank you for coming to the office today.  1. It sounds like you have a Sinusitis (Bacterial Infection) - this most likely started as an Upper Respiratory Virus that has settled into an infection.  - Start Augmentin 1 pill twice daily (breakfast and dinner, with food and plenty of water) for 10 days, complete entire course, do not stop early even if feeling better  FLU TEST - NEGATIVE  Start Tessalon Perls take 1 capsule up to 3 times a day as needed for cough  - Continue Flonase 2 sprays in each nostril daily for next 4-6 weeks, then you may stop and use seasonally or as needed  - Improve hydration by drinking plenty of clear fluids (water, gatorade) to reduce secretions and thin congestion - Congestion draining down throat can cause irritation. May try warm herbal tea with honey, cough drops - Can take Tylenol or Ibuprofen as needed for fevers - May continue over the counter cold medicine as you are, I would not use any decongestant or mucinex longer than 7 days.  If you develop persistent fever >101F for at least 3 consecutive days, headaches with sinus pain or pressure or persistent earache, please schedule a follow-up evaluation within next few days to week.  Please schedule a Follow-up Appointment to: Return in about 1 week (around 12/24/2018), or if symptoms worsen or fail to improve, for sinusitis.  If you have any other questions or concerns, please feel free to call the office or send a message through MyChart. You may also schedule an earlier appointment if necessary.  Additionally, you may be receiving a survey about your experience at our office within a few days to 1 week by e-mail or mail. We value your feedback.  Saralyn Pilar, DO [facility], [place]

## 2018-12-17 NOTE — Progress Notes (Signed)
Subjective:    Patient ID: Paige House, female    DOB: 10/07/1934, 85 y.o.   MRN: [id number]  Paige House is a 85 y.o. female presenting on 12/17/2018 for Dizziness (onset 7 days cough, ear pain, bodyache, chills but denies fever, exhaustion, diarrhea and dizziness, HA and achy eyes)   HPI   SINUSITIS / Flu-like illness Reports symptoms of generalized body aches and feeling malaise, onset 7-9 days ago, also with sinus congestion and pressure, ear pain and felt like "ears clogged", she rested mostly at home due to illness, felt it was "severe". Now has had some recurrence of vertigo if turns head wrong way, limited improvement. - Last illness 07/2018 sinusitis, treated with Augmentin, with improvement, also had vertigo secondary problem, she states did not improve with Epley maneuver at home. - Admits post nasal drip - affecting her throat and cough - she is using flonase regularly - Admits dry cough, non productive - tried mucinex without improvement - Admits chronic dry eyes - Denies any fevers, sweats, or chills  Health Maintenance: Did not get Flu Vaccine this season.  [facility] 2/9 12/17/2018 08/03/2018 10/09/2017  Decreased Interest 0 0 0  Down, Depressed, Hopeless 0 0 0  PHQ - 2 Score 0 0 0  Altered sleeping - - 3  Tired, decreased energy - - 0  Change in appetite - - 3  Feeling bad or failure about yourself  - - 0  Trouble concentrating - - 0  Moving slowly or fidgety/restless - - 0  Suicidal thoughts - - 0  PHQ-9 Score - - 6  Difficult doing work/chores - - Not difficult at all    Social History   Tobacco Use  . Smoking status: Former Smoker    Start date: 07/26/1985  . Smokeless tobacco: Former Engineer, waterUser  Substance Use Topics  . Alcohol use: No    Alcohol/week: 0.0 standard drinks  . Drug use: No    Review of Systems Per HPI unless specifically indicated above     Objective:    BP 136/60   Pulse 60   Temp 98.6 F (37 C) (Oral)   Resp  16   Ht 5' (1.524 m)   SpO2 100%   BMI 27.34 kg/m   Wt Readings from Last 3 Encounters:  08/03/18 140 lb (63.5 kg)  01/01/18 146 lb 9.6 oz (66.5 kg)  12/04/17 146 lb (66.2 kg)    Physical Exam Vitals signs and nursing note reviewed.  Constitutional:      General: She is not in acute distress.    Appearance: She is well-developed. She is not diaphoretic.     Comments: Tired-appearing, comfortable, cooperative  HENT:     Head: Normocephalic and atraumatic.     Comments: L>R maxillary sinuses tender. Nares patent without purulence or edema. R TM not visible due to cerumen. L TM with some clear effusion, no erythema.  Oropharynx clear without erythema, exudates, edema or asymmetry. Eyes:     General:        Right eye: No discharge.        Left eye: No discharge.     Conjunctiva/sclera: Conjunctivae normal.  Neck:     Musculoskeletal: Normal range of motion and neck supple.     Thyroid: No thyromegaly.  Cardiovascular:     Rate and Rhythm: Normal rate and regular rhythm.     Heart sounds: Normal heart sounds. No murmur.  Pulmonary:     Effort: Pulmonary effort  is normal. No respiratory distress.     Breath sounds: Normal breath sounds. No wheezing or rales.  Musculoskeletal: Normal range of motion.  Lymphadenopathy:     Cervical: No cervical adenopathy.  Skin:    General: Skin is warm and dry.     Findings: No erythema or rash.  Neurological:     Mental Status: She is alert and oriented to person, place, and time.  Psychiatric:        Behavior: Behavior normal.     Comments: Well groomed, good eye contact, normal speech and thoughts    Results for orders placed or performed in visit on 12/17/18  POCT Influenza A/B  Result Value Ref Range   Influenza A, POC Negative Negative   Influenza B, POC Negative Negative      Assessment & Plan:   Problem List Items Addressed This Visit    None    Visit Diagnoses    Acute non-recurrent maxillary sinusitis    -  Primary    Relevant Medications   amoxicillin-clavulanate (AUGMENTIN) 875-125 MG tablet   benzonatate (TESSALON) 100 MG capsule   Generalized body aches       Relevant Orders   POCT Influenza A/B (Completed)       Clinically with sinusitis based on history and exam and duration of symptoms, similar to prior episode 07/2018 treated with augmentin. Seems to trigger her Vertigo as well. - Concern for possible influenza given constellation of generalized symptoms, no sick contact, did not get flu vaccine this season, - however duration >72 hours limited benefit from treatment - Tolerating PO, not clinically dehydrated - No other focal findings of infection today  Plan: RAPID Flu test negative today, reassurance Proceed with therapy for Sinusitis - augmentin BID x 10 days Start Tessalon Perls take 1 capsule up to 3 times a day as needed for cough Continue OTC supportive meds, continue current Flonase Follow-up if not improved consider Chest X-ray if worsening productive cough / fevers, otherwise consider other cough therapy if need  Return criteria when to seek care if not improved or sudden worsening   Meds ordered this encounter  Medications  . amoxicillin-clavulanate (AUGMENTIN) 875-125 MG tablet    Sig: Take 1 tablet by mouth 2 (two) times daily. For 10 days    Dispense:  20 tablet    Refill:  0  . benzonatate (TESSALON) 100 MG capsule    Sig: Take 1 capsule (100 mg total) by mouth 3 (three) times daily as needed for cough.    Dispense:  30 capsule    Refill:  0     Follow up plan: Return in about 1 week (around 12/24/2018), or if symptoms worsen or fail to improve, for sinusitis.   Saralyn Pilar, DO [facility] 12/17/2018, 2:07 PM

## 2018-12-29 ENCOUNTER — Telehealth: Payer: Self-pay | Admitting: Family Medicine

## 2018-12-29 DIAGNOSIS — J3089 Other allergic rhinitis: Secondary | ICD-10-CM

## 2018-12-29 MED ORDER — LORATADINE-PSEUDOEPHEDRINE ER 10-240 MG PO TB24: 1.0000 | ORAL_TABLET | Freq: Every day | ORAL | 0 refills | Status: DC

## 2018-12-29 NOTE — Telephone Encounter (Signed)
Sent rx Claritin-D to [facility]  Saralyn Pilar, DO [facility] 12/29/2018, 4:36 PM

## 2018-12-29 NOTE — Telephone Encounter (Signed)
Pt wanted to know if you would call something in for her sinus for daytime, she was ws taken  Mediation for night tme . Pt call back # is  [phone]

## 2019-01-31 ENCOUNTER — Other Ambulatory Visit: Payer: Self-pay

## 2019-01-31 ENCOUNTER — Encounter: Payer: Self-pay | Admitting: Family Medicine

## 2019-01-31 ENCOUNTER — Ambulatory Visit (INDEPENDENT_AMBULATORY_CARE_PROVIDER_SITE_OTHER): Payer: Medicare Other | Admitting: Family Medicine

## 2019-01-31 VITALS — BP 128/69 | HR 72 | Temp 99.3°F | Resp 16 | Ht 60.0 in

## 2019-01-31 DIAGNOSIS — T23262A Burn of second degree of back of left hand, initial encounter: Secondary | ICD-10-CM | POA: Diagnosis not present

## 2019-01-31 MED ORDER — TRAMADOL HCL 50 MG PO TABS: 50.0000 mg | ORAL_TABLET | Freq: Three times a day (TID) | ORAL | 0 refills | Status: AC | PRN

## 2019-01-31 MED ORDER — SILVER SULFADIAZINE 1 % EX CREA: 1.0000 "application " | TOPICAL_CREAM | Freq: Every day | CUTANEOUS | 0 refills | Status: DC

## 2019-01-31 MED ORDER — AMOXICILLIN-POT CLAVULANATE 875-125 MG PO TABS: 1.0000 | ORAL_TABLET | Freq: Two times a day (BID) | ORAL | 0 refills | Status: DC
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
## 2019-03-10 ENCOUNTER — Other Ambulatory Visit: Payer: Self-pay | Admitting: Family Medicine

## 2019-05-05 ENCOUNTER — Other Ambulatory Visit: Payer: Self-pay

## 2019-05-05 NOTE — Patient Outreach (Signed)
[facility]) Care Management  05/05/2019  Paige House [birth year] [id number]   Medication Adherence call to Mrs. Paige House patient did not answer patient is due on Pravastatin 40 mg under [facility] Ins.   Lillia Abed CPhT Pharmacy Technician [facility] Direct Dial [phone]  Fax [phone] [email or web address]

## 2019-06-28 ENCOUNTER — Other Ambulatory Visit: Payer: Self-pay | Admitting: Gastroenterology

## 2019-06-28 DIAGNOSIS — K589 Irritable bowel syndrome without diarrhea: Secondary | ICD-10-CM

## 2019-06-28 NOTE — Telephone Encounter (Signed)
Pt is calling she  Needs refill on rx  Lamodil (Generic)  2.5 /0.025 ml Pharmacy [facility]

## 2019-06-29 NOTE — Telephone Encounter (Signed)
Patient called to thank you for calling in the medication.

## 2019-06-29 NOTE — Telephone Encounter (Signed)
Patient called & l/m stating she needs Ginger to call [facility] to have her   diphenoxylate-atropine (LOMOTIL) 2.5-0.025 MG tablet   Filled she is out & not doing well. She states the pharmacy has sent 2 request. Please call the pharmacy.

## 2019-08-24 ENCOUNTER — Ambulatory Visit: Payer: Medicare Other

## 2019-08-29 ENCOUNTER — Telehealth: Payer: Self-pay | Admitting: Family Medicine

## 2019-08-29 NOTE — Telephone Encounter (Signed)
Pt  Wanted to know if she could stil get her flu shot with a sinus infection. Pt call back # is  [phone]

## 2019-08-30 NOTE — Telephone Encounter (Signed)
Left message for patient to call back  

## 2019-08-31 ENCOUNTER — Ambulatory Visit: Payer: Medicare Other

## 2019-08-31 NOTE — Telephone Encounter (Addendum)
Spoke to patient she does not have sinus infection just the HA which is ongoing from long time. Advised her as long as she doesn't have fever or runny nose or sinus infection and not allergic to flu shot then she can gets one she will schedule appt for future.

## 2019-09-04 ENCOUNTER — Other Ambulatory Visit: Payer: Self-pay | Admitting: Family Medicine

## 2019-09-04 DIAGNOSIS — J3089 Other allergic rhinitis: Secondary | ICD-10-CM

## 2019-09-04 DIAGNOSIS — G2581 Restless legs syndrome: Secondary | ICD-10-CM

## 2019-10-28 ENCOUNTER — Ambulatory Visit: Payer: Medicare Other

## 2019-11-01 ENCOUNTER — Other Ambulatory Visit: Payer: Self-pay | Admitting: Family Medicine

## 2019-11-01 DIAGNOSIS — G609 Hereditary and idiopathic neuropathy, unspecified: Secondary | ICD-10-CM

## 2019-11-03 ENCOUNTER — Other Ambulatory Visit: Payer: Self-pay

## 2019-11-03 ENCOUNTER — Ambulatory Visit (INDEPENDENT_AMBULATORY_CARE_PROVIDER_SITE_OTHER): Payer: Medicare Other

## 2019-11-03 DIAGNOSIS — Z23 Encounter for immunization: Secondary | ICD-10-CM

## 2019-11-07 ENCOUNTER — Telehealth: Payer: Self-pay | Admitting: Family Medicine

## 2019-11-07 NOTE — Telephone Encounter (Signed)
I left a message asking the patient to call and schedule AWV with Tiffany. Last AWV 10/17/14 so patient can be scheduled at next available opening. VDM (DD)

## 2019-11-15 ENCOUNTER — Ambulatory Visit (INDEPENDENT_AMBULATORY_CARE_PROVIDER_SITE_OTHER): Payer: Medicare Other

## 2019-11-15 VITALS — Ht 61.0 in

## 2019-11-15 DIAGNOSIS — Z0000 Encounter for general adult medical examination without abnormal findings: Secondary | ICD-10-CM | POA: Diagnosis not present

## 2019-11-15 NOTE — Patient Instructions (Addendum)
Paige House , Thank you for taking time to come for your Medicare Wellness Visit. I appreciate your ongoing commitment to your health goals. Please review the following plan we discussed and let me know if I can assist you in the future.   Screening recommendations/referrals: Colonoscopy: no longer required  Mammogram: no longer required Bone Density: no longer required Recommended yearly ophthalmology/optometry visit for glaucoma screening and checkup Recommended yearly dental visit for hygiene and checkup  Vaccinations: Influenza vaccine: up to date Pneumococcal vaccine: up to date Tdap vaccine: up to date  Shingles vaccine: shingrix eligible   Advanced directives: Please bring a copy of your health care power of attorney and living will to the office at your convenience.  Conditions/risks identified: none   Next appointment: Follow up in one year for your annual wellness visit.    Preventive Care 83 Years and Older, Female Preventive care refers to lifestyle choices and visits with your health care provider that can promote health and wellness. What does preventive care include?  A yearly physical exam. This is also called an annual well check.  Dental exams once or twice a year.  Routine eye exams. Ask your health care provider how often you should have your eyes checked.  Personal lifestyle choices, including:  Daily care of your teeth and gums.  Regular physical activity.  Eating a healthy diet.  Avoiding tobacco and drug use.  Limiting alcohol use.  Practicing safe sex.  Taking low-dose aspirin every day.  Taking vitamin and mineral supplements as recommended by your health care provider. What happens during an annual well check? The services and screenings done by your health care provider during your annual well check will depend on your age, overall health, lifestyle risk factors, and family history of disease. Counseling  Your health care provider may  ask you questions about your:  Alcohol use.  Tobacco use.  Drug use.  Emotional well-being.  Home and relationship well-being.  Sexual activity.  Eating habits.  History of falls.  Memory and ability to understand (cognition).  Work and work Statistician.  Reproductive health. Screening  You may have the following tests or measurements:  Height, weight, and BMI.  Blood pressure.  Lipid and cholesterol levels. These may be checked every 5 years, or more frequently if you are over 83 years old.  Skin check.  Lung cancer screening. You may have this screening every year starting at age 83 if you have a 30-pack-year history of smoking and currently smoke or have quit within the past 15 years.  Fecal occult blood test (FOBT) of the stool. You may have this test every year starting at age 83.  Flexible sigmoidoscopy or colonoscopy. You may have a sigmoidoscopy every 5 years or a colonoscopy every 10 years starting at age 83.  Hepatitis C blood test.  Hepatitis B blood test.  Sexually transmitted disease (STD) testing.  Diabetes screening. This is done by checking your blood sugar (glucose) after you have not eaten for a while (fasting). You may have this done every 1-3 years.  Bone density scan. This is done to screen for osteoporosis. You may have this done starting at age 83.  Mammogram. This may be done every 1-2 years. Talk to your health care provider about how often you should have regular mammograms. Talk with your health care provider about your test results, treatment options, and if necessary, the need for more tests. Vaccines  Your health care provider may recommend certain vaccines, such  as:  Influenza vaccine. This is recommended every year.  Tetanus, diphtheria, and acellular pertussis (Tdap, Td) vaccine. You may need a Td booster every 10 years.  Zoster vaccine. You may need this after age 83.  Pneumococcal 13-valent conjugate (PCV13) vaccine. One  dose is recommended after age 83.  Pneumococcal polysaccharide (PPSV23) vaccine. One dose is recommended after age 83. Talk to your health care provider about which screenings and vaccines you need and how often you need them. This information is not intended to replace advice given to you by your health care provider. Make sure you discuss any questions you have with your health care provider. Document Released: 12/28/2015 Document Revised: 08/20/2016 Document Reviewed: 10/02/2015 Elsevier Interactive Patient Education  2017 [place] Prevention in the Home Falls can cause injuries. They can happen to people of all ages. There are many things you can do to make your home safe and to help prevent falls. What can I do on the outside of my home?  Regularly fix the edges of walkways and driveways and fix any cracks.  Remove anything that might make you trip as you walk through a door, such as a raised step or threshold.  Trim any bushes or trees on the path to your home.  Use bright outdoor lighting.  Clear any walking paths of anything that might make someone trip, such as rocks or tools.  Regularly check to see if handrails are loose or broken. Make sure that both sides of any steps have handrails.  Any raised decks and porches should have guardrails on the edges.  Have any leaves, snow, or ice cleared regularly.  Use sand or salt on walking paths during winter.  Clean up any spills in your garage right away. This includes oil or grease spills. What can I do in the bathroom?  Use night lights.  Install grab bars by the toilet and in the tub and shower. Do not use towel bars as grab bars.  Use non-skid mats or decals in the tub or shower.  If you need to sit down in the shower, use a plastic, non-slip stool.  Keep the floor dry. Clean up any water that spills on the floor as soon as it happens.  Remove soap buildup in the tub or shower regularly.  Attach bath  mats securely with double-sided non-slip rug tape.  Do not have throw rugs and other things on the floor that can make you trip. What can I do in the bedroom?  Use night lights.  Make sure that you have a light by your bed that is easy to reach.  Do not use any sheets or blankets that are too big for your bed. They should not hang down onto the floor.  Have a firm chair that has side arms. You can use this for support while you get dressed.  Do not have throw rugs and other things on the floor that can make you trip. What can I do in the kitchen?  Clean up any spills right away.  Avoid walking on wet floors.  Keep items that you use a lot in easy-to-reach places.  If you need to reach something above you, use a strong step stool that has a grab bar.  Keep electrical cords out of the way.  Do not use floor polish or wax that makes floors slippery. If you must use wax, use non-skid floor wax.  Do not have throw rugs and other things on the  floor that can make you trip. What can I do with my stairs?  Do not leave any items on the stairs.  Make sure that there are handrails on both sides of the stairs and use them. Fix handrails that are broken or loose. Make sure that handrails are as long as the stairways.  Check any carpeting to make sure that it is firmly attached to the stairs. Fix any carpet that is loose or worn.  Avoid having throw rugs at the top or bottom of the stairs. If you do have throw rugs, attach them to the floor with carpet tape.  Make sure that you have a light switch at the top of the stairs and the bottom of the stairs. If you do not have them, ask someone to add them for you. What else can I do to help prevent falls?  Wear shoes that:  Do not have high heels.  Have rubber bottoms.  Are comfortable and fit you well.  Are closed at the toe. Do not wear sandals.  If you use a stepladder:  Make sure that it is fully opened. Do not climb a closed  stepladder.  Make sure that both sides of the stepladder are locked into place.  Ask someone to hold it for you, if possible.  Clearly mark and make sure that you can see:  Any grab bars or handrails.  First and last steps.  Where the edge of each step is.  Use tools that help you move around (mobility aids) if they are needed. These include:  Canes.  Walkers.  Scooters.  Crutches.  Turn on the lights when you go into a dark area. Replace any light bulbs as soon as they burn out.  Set up your furniture so you have a clear path. Avoid moving your furniture around.  If any of your floors are uneven, fix them.  If there are any pets around you, be aware of where they are.  Review your medicines with your doctor. Some medicines can make you feel dizzy. This can increase your chance of falling. Ask your doctor what other things that you can do to help prevent falls. This information is not intended to replace advice given to you by your health care provider. Make sure you discuss any questions you have with your health care provider. Document Released: 09/27/2009 Document Revised: 05/08/2016 Document Reviewed: 01/05/2015 Elsevier Interactive Patient Education  2017 [employer].

## 2019-11-15 NOTE — Progress Notes (Signed)
Subjective:   Paige House is a 85 y.o. female who presents for Medicare Annual (Subsequent) preventive examination.  This visit is being conducted via phone call  - after an attmept to do on video chat - due to the COVID-19 pandemic. This patient has given me verbal consent via phone to conduct this visit, patient states they are participating from their home address. Some vital signs may be absent or patient reported.   Patient identification: identified by name, DOB, and current address.    Review of Systems:   Cardiac Risk Factors include: advanced age (>60men, >17 women);dyslipidemia;hypertension     Objective:     Vitals: Ht 5\' 1"  (1.549 m)   BMI 26.45 kg/m   Body mass index is 26.45 kg/m.  Advanced Directives 11/15/2019 12/31/2015  Does Patient Have a Medical Advance Directive? Yes Yes  Type of Advance Directive Living will;Healthcare Power of Attorney -  Copy of Healthcare Power of Attorney in Chart? No - copy requested -    Tobacco Social History   Tobacco Use  Smoking Status Former Smoker  . Start date: 07/26/1985  Smokeless Tobacco Never Used     Counseling given: Not Answered   Clinical Intake:  Pre-visit preparation completed: Yes  Pain : No/denies pain     Nutritional Risks: None Diabetes: No     Interpreter Needed?: No  Information entered by :: Tiffany Hill,LPN  Past Medical History:  Diagnosis Date  . Anxiety   . Hyperlipidemia   . IBS (irritable bowel syndrome)    Past Surgical History:  Procedure Laterality Date  . ABDOMINAL HYSTERECTOMY    . APPENDECTOMY    . CHOLECYSTECTOMY     Family History  Problem Relation Age of Onset  . COPD Daughter   . Coronary artery disease Daughter   . Coronary artery disease Son   . Obesity Daughter    Social History   Socioeconomic History  . Marital status: Widowed    Spouse name: Not on file  . Number of children: Not on file  . Years of education: Not on file  . Highest  education level: Not on file  Occupational History  . Occupation: retired  [phone]  . Financial resource strain: Not hard at all  . Food insecurity    Worry: Never true    Inability: Never true  . Transportation needs    Medical: No    Non-medical: No  Tobacco Use  . Smoking status: Former Smoker    Start date: 07/26/1985  . Smokeless tobacco: Never Used  Substance and Sexual Activity  . Alcohol use: No    Alcohol/week: 0.0 standard drinks  . Drug use: No  . Sexual activity: Not on file  Lifestyle  . Physical activity    Days per week: 3 days    Minutes per session: 30 min  . Stress: Not at all  Relationships  . Social connections    Talks on phone: More than three times a week    Gets together: Never    Attends religious service: More than 4 times per year    Active member of club or organization: No    Attends meetings of clubs or organizations: Never    Relationship status: Widowed  Other Topics Concern  . Not on file  Social History Narrative  . Not on file    Outpatient Encounter Medications as of 11/15/2019  Medication Sig  . acetaminophen (TYLENOL) 500 MG tablet Take 2 tabs up to  3 times a day for arthritis pain  . cetirizine (ZYRTEC) 10 MG tablet Take 10 mg by mouth daily.  . diphenoxylate-atropine (LOMOTIL) 2.5-0.025 MG tablet TAKE 1 OR 2 TABLETS BY MOUTH 4 TIMES DAILY AS NEEDED DIARRHEA  **PLEASE SCHEDULE A FOLLOW UP APPT**  . erythromycin ophthalmic ointment Place 1 application into the left eye 2 (two) times daily. For 1-2 weeks until resolved  . FLUoxetine (PROZAC) 20 MG capsule TAKE 1 CAPSULE(20 MG) BY MOUTH DAILY  . fluticasone (FLONASE) 50 MCG/ACT nasal spray Place 2 sprays into both nostrils daily. Use for 4-6 weeks then stop and use seasonally or as needed.  . gabapentin (NEURONTIN) 300 MG capsule TAKE 2 CAPSULES BY MOUTH IN THE MORNING , 2 CAPSULES IN THE AFTERNOON, AND 4  CAPSULES AT BEDTIME  . lisinopril (ZESTRIL) 5 MG tablet TAKE 1 TABLET BY  MOUTH  DAILY  . Multiple Vitamins-Minerals (ONE-A-DAY WOMENS 50 PLUS PO) Take by mouth.  . pravastatin (PRAVACHOL) 40 MG tablet TAKE 1 TABLET BY MOUTH  DAILY  . triamcinolone cream (KENALOG) 0.5 % Apply topically 2 (two) times daily. Up to 2 weeks as needed for eczema  . meclizine (ANTIVERT) 25 MG tablet Take 1 tablet (25 mg total) by mouth 3 (three) times daily as needed for dizziness. (Patient not taking: Reported on 11/15/2019)  . [DISCONTINUED] amoxicillin-clavulanate (AUGMENTIN) 875-125 MG tablet Take 1 tablet by mouth 2 (two) times daily. (Patient not taking: Reported on 11/15/2019)  . [DISCONTINUED] loratadine-pseudoephedrine (CLARITIN-D 24-HOUR) 10-240 MG 24 hr tablet Take 1 tablet by mouth daily. (Patient not taking: Reported on 11/15/2019)  . [DISCONTINUED] montelukast (SINGULAIR) 10 MG tablet TAKE 1 TABLET BY MOUTH AT  BEDTIME (Patient not taking: Reported on 11/15/2019)  . [DISCONTINUED] oxybutynin (DITROPAN) 5 MG tablet TAKE 1 TABLET BY MOUTH  DAILY (Patient not taking: Reported on 11/15/2019)  . [DISCONTINUED] rOPINIRole (REQUIP) 0.25 MG tablet TAKE 1 TO 4 TABLETS BY  MOUTH AT BEDTIME. (Patient not taking: Reported on 11/15/2019)  . [DISCONTINUED] sertraline (ZOLOFT) 25 MG tablet TAKE 1 TABLET BY MOUTH  DAILY (Patient not taking: Reported on 11/15/2019)  . [DISCONTINUED] silver sulfADIAZINE (SILVADENE) 1 % cream Apply 1 application topically daily. For 1-2 weeks as burn heals (Patient not taking: Reported on 11/15/2019)  . [DISCONTINUED] terbinafine (LAMISIL AT) 1 % cream Apply 1 application topically 2 (two) times daily. As needed for up to 2 weeks for candidal (Patient not taking: Reported on 11/15/2019)   No facility-administered encounter medications on file as of 11/15/2019.     Activities of Daily Living In your present state of health, do you have any difficulty performing the following activities: 11/15/2019  Hearing? N  Comment no hearing aids  Vision? N  Comment reading glasses   Difficulty concentrating or making decisions? N  Walking or climbing stairs? N  Dressing or bathing? N  Doing errands, shopping? N  Preparing Food and eating ? N  Using the Toilet? N  In the past six months, have you accidently leaked urine? N  Do you have problems with loss of bowel control? N  Managing your Medications? N  Managing your Finances? N  Housekeeping or managing your Housekeeping? N  Some recent data might be hidden    Patient Care Team: Olin Hauser, DO as PCP - General (Family Medicine)    Assessment:   This is a routine wellness examination for [place].  Exercise Activities and Dietary recommendations Current Exercise Habits: Home exercise routine, Type of exercise: walking;strength training/weights, Time (  Minutes): 30, Frequency (Times/Week): 3, Weekly Exercise (Minutes/Week): 90, Intensity: Mild, Exercise limited by: None identified  Goals   None     Fall Risk: Fall Risk  11/15/2019 01/31/2019 12/17/2018 08/03/2018 10/09/2017  Falls in the past year? 0 0 0 No No  Number falls in past yr: 0 - - - -  Injury with Fall? 0 - - - -  Follow up - Falls evaluation completed Falls evaluation completed - -    FALL RISK PREVENTION PERTAINING TO THE HOME:   Any stairs in or around the home? No  If so, are there any without handrails? No   Home free of loose throw rugs in walkways, pet beds, electrical cords, etc? Yes  Adequate lighting in your home to reduce risk of falls? Yes   ASSISTIVE DEVICES UTILIZED TO PREVENT FALLS:  Life alert? No  Use of a cane, walker or w/c? No  Grab bars in the bathroom? No  Shower chair or bench in shower? No  Elevated toilet seat or a handicapped toilet? No   DME ORDERS:  DME order needed?  No   TIMED UP AND GO:  unbable to perform   Depression Screen PHQ 2/9 Scores 11/15/2019 11/15/2019 01/31/2019 12/17/2018  PHQ - 2 Score 0 0 0 0  PHQ- 9 Score - - - -     Cognitive Function        Immunization History   Administered Date(s) Administered  . Fluad Quad(high Dose 65+) 11/03/2019  . Influenza, High Dose Seasonal PF 09/25/2015, 10/09/2017    Qualifies for Shingles Vaccine? Yes  Zostavax completed n/a. Due for Shingrix. Education has been provided regarding the importance of this vaccine. Pt has been advised to call insurance company to determine out of pocket expense. Advised may also receive vaccine at local pharmacy or Health Dept. Verbalized acceptance and understanding.   Tdap: up to date   Flu Vaccine: up to date   Pneumococcal Vaccine: up to date   Screening Tests Health Maintenance  Topic Date Due  . TETANUS/TDAP  12/15/2022  . INFLUENZA VACCINE  Completed  . PNA vac Low Risk Adult  Completed  . DEXA SCAN  Discontinued    Cancer Screenings:  Colorectal Screening: no longer required  Mammogram: no longer required   Bone Density: no longer required   Lung Cancer Screening: (Low Dose CT Chest recommended if Age 77-80 years, 30 pack-year currently smoking OR have quit w/in 15years.) does not qualify.    Additional Screening:  Hepatitis C Screening: does not qualify  Vision Screening: Recommended annual ophthalmology exams for early detection of glaucoma and other disorders of the eye. Is the patient up to date with their annual eye exam?  No    Dental Screening: Recommended annual dental exams for proper oral hygiene  Community Resource Referral:  CRR required this visit?  No       Plan:  I have personally reviewed and addressed the Medicare Annual Wellness questionnaire and have noted the following in the patient's chart:  A. Medical and social history B. Use of alcohol, tobacco or illicit drugs  C. Current medications and supplements D. Functional ability and status E.  Nutritional status F.  Physical activity G. Advance directives H. List of other physicians I.  Hospitalizations, surgeries, and ER visits in previous 12 months J.  Vitals K. Screenings  such as hearing and vision if needed, cognitive and depression L. Referrals and appointments   In addition, I have reviewed and discussed with  patient certain preventive protocols, quality metrics, and best practice recommendations. A written personalized care plan for preventive services as well as general preventive health recommendations were provided to patient.  Signed,    Collene SchlichterHill, Tiffany A, LPN  [id number]/1/096012/12/2018 Nurse Health Advisor   Nurse Notes: none

## 2019-11-28 ENCOUNTER — Other Ambulatory Visit: Payer: Self-pay

## 2019-11-28 NOTE — Patient Outreach (Signed)
[facility]) Care Management  11/28/2019  Paige House 02/05/1934 [id number]   Medication Adherence call to Paige House Compliant Voice message left with a call back number. Paige House is showing past due on Pravastatin 40 mg under Paige House.  Paige House Management Direct Dial [phone]  Fax [phone] [email or web address]

## 2019-12-01 ENCOUNTER — Other Ambulatory Visit: Payer: Self-pay

## 2019-12-01 NOTE — Patient Outreach (Signed)
[facility]) Care Management  12/01/2019  Paige House 10/20/1934 [id number]   Medication Adherence call to Mrs. Paige House [employer] Identifiers Verify spoke with patient she is past due on Pravastatin 40 mg,patient explain she takes 1 tablet daily and has enough until the end of the month and then she will order. Paige House is showing past due under [place].   [facility] Management Direct Dial [phone]  Fax [phone] [email or web address]

## 2019-12-02 ENCOUNTER — Other Ambulatory Visit: Payer: Self-pay | Admitting: Family Medicine

## 2019-12-02 DIAGNOSIS — F331 Major depressive disorder, recurrent, moderate: Secondary | ICD-10-CM

## 2019-12-02 DIAGNOSIS — F5081 Binge eating disorder: Secondary | ICD-10-CM

## 2019-12-02 NOTE — Telephone Encounter (Signed)
Patient has not been seen since February 2020- please schedule follow up

## 2019-12-06 ENCOUNTER — Telehealth: Payer: Self-pay | Admitting: Family Medicine

## 2019-12-06 NOTE — Telephone Encounter (Signed)
Pt. Called requesting refill on Prozac

## 2019-12-07 ENCOUNTER — Other Ambulatory Visit: Payer: Self-pay | Admitting: Family Medicine

## 2019-12-07 DIAGNOSIS — F331 Major depressive disorder, recurrent, moderate: Secondary | ICD-10-CM

## 2019-12-07 DIAGNOSIS — F5081 Binge eating disorder: Secondary | ICD-10-CM

## 2019-12-07 MED ORDER — FLUOXETINE HCL 20 MG PO CAPS: ORAL_CAPSULE | ORAL | 0 refills | Status: DC

## 2019-12-20 ENCOUNTER — Other Ambulatory Visit: Payer: Self-pay | Admitting: Family Medicine

## 2019-12-20 DIAGNOSIS — F331 Major depressive disorder, recurrent, moderate: Secondary | ICD-10-CM

## 2019-12-20 DIAGNOSIS — F5081 Binge eating disorder: Secondary | ICD-10-CM

## 2019-12-20 NOTE — Telephone Encounter (Signed)
Review fluoxetine 20 mg for refill. Filled by Olevia Perches PCP Adrian Blackwater

## 2019-12-21 NOTE — Telephone Encounter (Signed)
Not our patient. Dr. Laural Benes was only covering for a short time. Please resend to the correct office pool.

## 2019-12-29 ENCOUNTER — Other Ambulatory Visit: Payer: Self-pay

## 2019-12-29 DIAGNOSIS — K589 Irritable bowel syndrome without diarrhea: Secondary | ICD-10-CM

## 2019-12-29 MED ORDER — DIPHENOXYLATE-ATROPINE 2.5-0.025 MG PO TABS: ORAL_TABLET | ORAL | 0 refills | Status: DC

## 2020-02-06 ENCOUNTER — Other Ambulatory Visit: Payer: Self-pay | Admitting: Family Medicine

## 2020-02-06 DIAGNOSIS — E782 Mixed hyperlipidemia: Secondary | ICD-10-CM

## 2020-02-29 ENCOUNTER — Encounter: Payer: Self-pay | Admitting: Family Medicine

## 2020-02-29 ENCOUNTER — Ambulatory Visit (INDEPENDENT_AMBULATORY_CARE_PROVIDER_SITE_OTHER): Payer: Medicare Other | Admitting: Family Medicine

## 2020-02-29 ENCOUNTER — Other Ambulatory Visit: Payer: Self-pay

## 2020-02-29 VITALS — BP 122/57 | HR 72 | Temp 96.9°F | Resp 16 | Ht 60.0 in | Wt 152.2 lb

## 2020-02-29 DIAGNOSIS — F5081 Binge eating disorder: Secondary | ICD-10-CM | POA: Diagnosis not present

## 2020-02-29 DIAGNOSIS — R632 Polyphagia: Secondary | ICD-10-CM

## 2020-02-29 DIAGNOSIS — L2082 Flexural eczema: Secondary | ICD-10-CM | POA: Diagnosis not present

## 2020-02-29 DIAGNOSIS — H8111 Benign paroxysmal vertigo, right ear: Secondary | ICD-10-CM | POA: Diagnosis not present

## 2020-02-29 DIAGNOSIS — J3089 Other allergic rhinitis: Secondary | ICD-10-CM

## 2020-02-29 DIAGNOSIS — N1831 Chronic kidney disease, stage 3a: Secondary | ICD-10-CM

## 2020-02-29 DIAGNOSIS — E782 Mixed hyperlipidemia: Secondary | ICD-10-CM | POA: Diagnosis not present

## 2020-02-29 DIAGNOSIS — G609 Hereditary and idiopathic neuropathy, unspecified: Secondary | ICD-10-CM

## 2020-02-29 MED ORDER — GABAPENTIN 300 MG PO CAPS: ORAL_CAPSULE | ORAL | 3 refills | Status: DC

## 2020-02-29 MED ORDER — MONTELUKAST SODIUM 10 MG PO TABS: 10.0000 mg | ORAL_TABLET | Freq: Every day | ORAL | 3 refills | Status: DC

## 2020-02-29 MED ORDER — TRIAMCINOLONE ACETONIDE 0.5 % EX CREA: TOPICAL_CREAM | Freq: Two times a day (BID) | CUTANEOUS | 1 refills | Status: DC

## 2020-02-29 MED ORDER — FLUOXETINE HCL 20 MG PO CAPS: ORAL_CAPSULE | ORAL | 3 refills | Status: DC

## 2020-02-29 MED ORDER — MECLIZINE HCL 25 MG PO TABS: 25.0000 mg | ORAL_TABLET | Freq: Every day | ORAL | 2 refills | Status: DC | PRN

## 2020-02-29 MED ORDER — PRAVASTATIN SODIUM 40 MG PO TABS: 40.0000 mg | ORAL_TABLET | Freq: Every day | ORAL | 3 refills | Status: DC

## 2020-02-29 MED ORDER — FLUTICASONE PROPIONATE 50 MCG/ACT NA SUSP: 2.0000 | Freq: Every day | NASAL | 3 refills | Status: AC

## 2020-02-29 NOTE — Progress Notes (Signed)
Subjective:    Patient ID: Paige House, female    DOB: 1934-04-30, 86 y.o.   MRN: [id number]  Paige House is a 86 y.o. female presenting on 02/29/2020 for Anxiety (refill)   HPI   Vertigo BPPV History of episodic dizziness. Vertigo symptoms. Improved on PRN Meclizine Needs refill  INSMONIA / BINGE-EATING DISORDER / Anxiety - Reports chronic history of mood disorder with comorbid problems of insomnia and binge eating, this has been very well controlled on Fluoxetine 20mg  daily, requesting refill today. - Has done well with insomnia as well - On medicine has less episodes of late night binge eating - Denies any depression  Peripheral Neuropathy / Restless Leg Syndrome - She reports these are chronic concern, affects her sleep - Currently taking Gabapentin 300mg  capsules x 2 per dose TID and 4 pills at night  Allergic Rhinitis Reports allergy symptoms currently. Using zyrtec with some relief. Now has had some night-time symptoms and occasional wheezing.  Health Maintenance: Due for next COVID19 vaccine  Depression screen [facility] 2/9 02/29/2020 11/15/2019 11/15/2019  Decreased Interest 0 0 0  Down, Depressed, Hopeless 0 0 0  PHQ - 2 Score 0 0 0  Altered sleeping - - -  Tired, decreased energy - - -  Change in appetite - - -  Feeling bad or failure about yourself  - - -  Trouble concentrating - - -  Moving slowly or fidgety/restless - - -  Suicidal thoughts - - -  PHQ-9 Score - - -  Difficult doing work/chores - - -   GAD 7 : Generalized Anxiety Score 02/29/2020 10/09/2017  Nervous, Anxious, on Edge 0 0  Control/stop worrying 0 0  Worry too much - different things 0 0  Trouble relaxing 0 3  Restless 0 0  Easily annoyed or irritable 0 1  Afraid - awful might happen 0 0  Total GAD 7 Score 0 4  Anxiety Difficulty Not difficult at all Not difficult at all     Social History   Tobacco Use  . Smoking status: Former Smoker    Start date: 07/26/1985  .  Smokeless tobacco: Never Used  Substance Use Topics  . Alcohol use: No    Alcohol/week: 0.0 standard drinks  . Drug use: No    Review of Systems Per HPI unless specifically indicated above     Objective:    BP (!) 122/57   Pulse 72   Temp (!) 96.9 F (36.1 C) (Temporal)   Resp 16   Ht 5' (1.524 m)   Wt 152 lb 3.2 oz (69 kg)   BMI 29.72 kg/m   Wt Readings from Last 3 Encounters:  02/29/20 152 lb 3.2 oz (69 kg)  08/03/18 140 lb (63.5 kg)  01/01/18 146 lb 9.6 oz (66.5 kg)    Physical Exam Vitals and nursing note reviewed.  Constitutional:      General: She is not in acute distress.    Appearance: She is well-developed. She is not diaphoretic.     Comments: Well-appearing, comfortable, cooperative  HENT:     Head: Normocephalic and atraumatic.  Eyes:     General:        Right eye: No discharge.        Left eye: No discharge.     Conjunctiva/sclera: Conjunctivae normal.     Pupils: Pupils are equal, round, and reactive to light.  Neck:     Thyroid: No thyromegaly.     Vascular: No carotid  bruit.  Cardiovascular:     Rate and Rhythm: Normal rate and regular rhythm.     Heart sounds: Normal heart sounds. No murmur.  Pulmonary:     Effort: Pulmonary effort is normal. No respiratory distress.     Breath sounds: Normal breath sounds. No wheezing or rales.  Abdominal:     General: Bowel sounds are normal. There is no distension.     Palpations: Abdomen is soft. There is no mass.     Tenderness: There is no abdominal tenderness.  Musculoskeletal:        General: No tenderness. Normal range of motion.     Cervical back: Normal range of motion and neck supple.     Comments: Upper / Lower Extremities: - Normal muscle tone, strength bilateral upper extremities 5/5, lower extremities 5/5  Lymphadenopathy:     Cervical: No cervical adenopathy.  Skin:    General: Skin is warm and dry.     Findings: No erythema or rash.  Neurological:     Mental Status: She is alert and  oriented to person, place, and time.     Comments: Distal sensation intact to light touch all extremities  Psychiatric:        Behavior: Behavior normal.     Comments: Well groomed, good eye contact, normal speech and thoughts        Results for orders placed or performed in visit on 12/17/18  POCT Influenza A/B  Result Value Ref Range   Influenza A, POC Negative Negative   Influenza B, POC Negative Negative      Assessment & Plan:   Problem List Items Addressed This Visit    Neuropathy, peripheral    Chronic problem with neuropathy Previously followed by [facility]  Plan 1. Continue current high dose Gabapentin 600mg  x 2 in AM and x 2 in afternoon, and x 4 in PM      Relevant Medications   FLUoxetine (PROZAC) 20 MG capsule   gabapentin (NEURONTIN) 300 MG capsule   Other Relevant Orders   CBC with Differential/Platelet   COMPLETE METABOLIC PANEL WITH GFR   Hyperlipidemia - Primary   Relevant Medications   pravastatin (PRAVACHOL) 40 MG tablet   Other Relevant Orders   Hemoglobin A1c   Lipid panel   TSH   CKD (chronic kidney disease), stage III (HCC)    Due for labs Will check chemistry panel for update BP controlled      Relevant Orders   CBC with Differential/Platelet   COMPLETE METABOLIC PANEL WITH GFR   BPPV (benign paroxysmal positional vertigo), right    Stable without recent flare Recurrent episodic issue On Meclizine PRN only, will send to [facility] as per request, otherwise can order at local pharmacy if need      Relevant Medications   meclizine (ANTIVERT) 25 MG tablet   Binge eating    Stable chronic problem No depression Controlled on Fluoxetine 20mg  daily, re order      Relevant Orders   COMPLETE METABOLIC PANEL WITH GFR   Allergic rhinitis   Relevant Medications   fluticasone (FLONASE) 50 MCG/ACT nasal spray   montelukast (SINGULAIR) 10 MG tablet    Other Visit Diagnoses    Binge eating disorder       Relevant Medications   FLUoxetine  (PROZAC) 20 MG capsule   Other Relevant Orders   Hemoglobin A1c   CBC with Differential/Platelet   COMPLETE METABOLIC PANEL WITH GFR   TSH   Flexural eczema  Relevant Medications   triamcinolone cream (KENALOG) 0.5 %      Updated Health Maintenance information Upcoming COVID19 vaccine dose #2 Due for labs, see below Encouraged improvement to lifestyle with diet and exercise Maintain healthy weight  #Allergic rhinitis Recent breakthrough symptoms Continue Zyrtec daily, Flonase daily re ordered Add Singulair 10mg  nightly for allergies and also mild wheezing possible asthma related symptom in evening, defer inhalers for now, can return and discuss inhalers if needed in future.  Meds ordered this encounter  Medications  . FLUoxetine (PROZAC) 20 MG capsule    Sig: TAKE 1 CAPSULE BY MOUTH ONCE DAILY    Dispense:  90 capsule    Refill:  3  . triamcinolone cream (KENALOG) 0.5 %    Sig: Apply topically 2 (two) times daily. Up to 2 weeks as needed for eczema    Dispense:  180 g    Refill:  1  . pravastatin (PRAVACHOL) 40 MG tablet    Sig: Take 1 tablet (40 mg total) by mouth daily.    Dispense:  90 tablet    Refill:  3    Requesting 1 year supply  . meclizine (ANTIVERT) 25 MG tablet    Sig: Take 1 tablet (25 mg total) by mouth daily as needed for dizziness.    Dispense:  60 tablet    Refill:  2  . gabapentin (NEURONTIN) 300 MG capsule    Sig: TAKE 2 CAPSULES BY MOUTH IN THE MORNING , 2 CAPSULES IN THE AFTERNOON, AND 4  CAPSULES AT BEDTIME    Dispense:  720 capsule    Refill:  3    Requesting 1 year supply  . fluticasone (FLONASE) 50 MCG/ACT nasal spray    Sig: Place 2 sprays into both nostrils daily. Use for 4-6 weeks then stop and use seasonally or as needed.    Dispense:  48 g    Refill:  3    Please keep refills on file, for 90 day supply  . montelukast (SINGULAIR) 10 MG tablet    Sig: Take 1 tablet (10 mg total) by mouth at bedtime.    Dispense:  90 tablet     Refill:  3     Follow up plan: Return in about 1 year (around 02/28/2021) for 03/02/2021.   Future fasting lab orders CMET CBC Lipid A1c TSH, routine, 1-2 weeks, walk in   [employer], DO [facility] 02/29/2020, 2:52 PM

## 2020-02-29 NOTE — Assessment & Plan Note (Signed)
Due for labs Will check chemistry panel for update BP controlled

## 2020-02-29 NOTE — Assessment & Plan Note (Addendum)
Stable chronic problem No depression Controlled on Fluoxetine 20mg  daily, re order

## 2020-02-29 NOTE — Assessment & Plan Note (Signed)
Stable without recent flare Recurrent episodic issue On Meclizine PRN only, will send to [facility] as per request, otherwise can order at local pharmacy if need

## 2020-02-29 NOTE — Assessment & Plan Note (Signed)
Chronic problem with neuropathy Previously followed by [facility] Neuro  Plan 1. Continue current high dose Gabapentin 600mg  x 2 in AM and x 2 in afternoon, and x 4 in PM

## 2020-02-29 NOTE — Patient Instructions (Addendum)
Thank you for coming to the office today.  Added new medicine Singulair (Montelukast generic) 10mg  nightly for allergy and asthma If not effective let me know  Continue other meds, refilled today  DUE for FASTING BLOOD WORK (no food or drink after midnight before the lab appointment, only water or coffee without cream/sugar on the morning of)  SCHEDULE "Lab Only" visit in the morning at the clinic for lab draw in 1-2 WEEKS   - Make sure Lab Only appointment is at about 1 week before your next appointment, so that results will be available  For Lab Results, once available within 2-3 days of blood draw, you can can log in to MyChart online to view your results and a brief explanation. Also, we can discuss results at next follow-up visit.    Please schedule a Follow-up Appointment to: Return in about 1 year (around 02/28/2021) for 03/02/2021.  If you have any other questions or concerns, please feel free to call the office or send a message through MyChart. You may also schedule an earlier appointment if necessary.  Additionally, you may be receiving a survey about your experience at our office within a few days to 1 week by e-mail or mail. We value your feedback.  [employer], DO [facility], [facility]

## 2020-03-28 NOTE — Telephone Encounter (Signed)
Routing to close encounter. 

## 2020-05-10 ENCOUNTER — Other Ambulatory Visit: Payer: Self-pay | Admitting: Gastroenterology

## 2020-05-10 DIAGNOSIS — K589 Irritable bowel syndrome without diarrhea: Secondary | ICD-10-CM

## 2020-05-25 ENCOUNTER — Encounter: Payer: Self-pay | Admitting: Family Medicine

## 2020-05-25 ENCOUNTER — Ambulatory Visit (INDEPENDENT_AMBULATORY_CARE_PROVIDER_SITE_OTHER): Payer: Medicare Other | Admitting: Family Medicine

## 2020-05-25 ENCOUNTER — Other Ambulatory Visit: Payer: Self-pay

## 2020-05-25 VITALS — BP 131/59 | HR 62 | Temp 97.3°F | Resp 16 | Ht 60.0 in

## 2020-05-25 DIAGNOSIS — R635 Abnormal weight gain: Secondary | ICD-10-CM

## 2020-05-25 DIAGNOSIS — H547 Unspecified visual loss: Secondary | ICD-10-CM | POA: Diagnosis not present

## 2020-05-25 DIAGNOSIS — I83811 Varicose veins of right lower extremities with pain: Secondary | ICD-10-CM

## 2020-05-25 DIAGNOSIS — R632 Polyphagia: Secondary | ICD-10-CM

## 2020-05-25 DIAGNOSIS — H259 Unspecified age-related cataract: Secondary | ICD-10-CM | POA: Diagnosis not present

## 2020-05-25 MED ORDER — BUPROPION HCL ER (XL) 150 MG PO TB24: 150.0000 mg | ORAL_TABLET | Freq: Every day | ORAL | 2 refills | Status: DC

## 2020-05-25 NOTE — Patient Instructions (Addendum)
Thank you for coming to the office today.  Check with [facility] on the cost / coverage of - Ropinirole 0.25mg  one at bedtime (restless leg medicine, generic for [employer]) - usually would increase the dose gradually by 1 extra pill per week.  Increase night-time gabapentin from 4 pills up to 5 now. After 3-5 days if doing well - and need a higher dose, can take up to 6 pills at once for bedtime.  Let me know we can consolidate rx in future higher dose in one pill 600mg   For right leg - try the copper compression, ice packs, and elevation  Use RICE therapy: - R - Rest / relative rest with activity modification avoid overuse of joint - I - Ice packs (make sure you use a towel or sock / something to protect skin) - C - Compression with flexible Knee Sleeve or ACE wrap to apply pressure and reduce swelling allowing more support - E - Elevation - if significant swelling, lift leg above heart level (toes above your nose) to help reduce swelling, most helpful at night after day of being on your feet  --------------------------------------------------------------------  If no improvement in 1 week, can contact me and I would refer you to Vein specialist for an ultrasound evaluation  [facility] [address] [place], [place]  Main: [phone]    If severe can go to hospital ED  ---------------------------------------------------  Referral in now for eye doctor  [facility] [address], [place], [place] Phone: [phone] [email or web address]   Please schedule a Follow-up Appointment to: Return in about 6 weeks (around 07/06/2020) for 6 weeks to 3 month follow-up neuropathy, leg swelling, eating.  If you have any other questions or concerns, please feel free to call the office or send a message through MyChart. You may also schedule an earlier appointment if necessary.  Additionally, you may be receiving a survey about  your experience at our office within a few days to 1 week by e-mail or mail. We value your feedback.  07/08/2020, DO [facility], [facility]

## 2020-05-25 NOTE — Progress Notes (Signed)
Subjective:    Patient ID: Paige House, female    DOB: 04/06/1934, 86 y.o.   MRN: [id number]  Paige House is a 86 y.o. female presenting on 05/25/2020 for Leg Pain and Weight Gain (as per patient gain 20 pounds in year)   HPI   RIGHT LEG VARICOSE VEIN / SWELLING Reports chronic history of circulation problems. 15 years ago, had acute DVT - at that time it was red swollen. Now this current episode is not severe. But she has recent in past few days had inc swelling localized and pain anterior mid R leg. She has no swelling below this area and none in ankle. No erythema or redness. No warmth. No breathing difficulty or shortness of breath or cough. - She has not done any topical ice packs or elevation  Chronic problem Left Lower Extremity / Heaviness / Neuropathy Known neuropathy, bilateral LE. Previously followed by [facility]rology Currently bothered by Left only. Gabapentin 300mg  x 2 in AM / x 2 in afternoon / x 4 in night. With some relief, asking if she can adjust dose In past thought RLS she was given Ropinirole but could not afford it. Admits Tingling At night heaviness. Not during the day. Not painful.  Abnormal Weight Gain / Overeating History of binge eating - but no longer having problem with binges. Now she just says overeating due to at home with COVID19 pandemic. Taking Prozac with good results but asking for something to help her appetite and wt.  Cataracts  Requesting referral to eye doctor.    Depression screen [facility] 2/9 02/29/2020 11/15/2019 11/15/2019  Decreased Interest 0 0 0  Down, Depressed, Hopeless 0 0 0  PHQ - 2 Score 0 0 0  Altered sleeping - - -  Tired, decreased energy - - -  Change in appetite - - -  Feeling bad or failure about yourself  - - -  Trouble concentrating - - -  Moving slowly or fidgety/restless - - -  Suicidal thoughts - - -  PHQ-9 Score - - -  Difficult doing work/chores - - -    Social History   Tobacco Use  .  Smoking status: Former Smoker    Start date: 07/26/1985  . Smokeless tobacco: Never Used  Substance Use Topics  . Alcohol use: No    Alcohol/week: 0.0 standard drinks  . Drug use: No    Review of Systems Per HPI unless specifically indicated above     Objective:    BP (!) 131/59   Pulse 62   Temp (!) 97.3 F (36.3 C) (Temporal)   Resp 16   Ht 5' (1.524 m)   SpO2 96%   BMI 29.72 kg/m   Wt Readings from Last 3 Encounters:  02/29/20 152 lb 3.2 oz (69 kg)  08/03/18 140 lb (63.5 kg)  01/01/18 146 lb 9.6 oz (66.5 kg)    Physical Exam Vitals and nursing note reviewed.  Constitutional:      General: She is not in acute distress.    Appearance: She is well-developed. She is not diaphoretic.     Comments: Well-appearing, comfortable, cooperative  HENT:     Head: Normocephalic and atraumatic.  Eyes:     General:        Right eye: No discharge.        Left eye: No discharge.     Conjunctiva/sclera: Conjunctivae normal.  Cardiovascular:     Rate and Rhythm: Normal rate.  Pulmonary:  Effort: Pulmonary effort is normal.  Musculoskeletal:     Right lower leg: No edema.     Left lower leg: No edema.     Comments: Right lower extremity anteriorly over mid shin has area of several cm without erythema, but has some induration vs localized edema has mild to moderate tenderness to palpation. Non tender normal calf and rest of RLE.  Left lower extremity without deformity or edema has some mild reduced sensation to light touch.  Skin:    General: Skin is warm and dry.     Findings: No erythema or rash.  Neurological:     Mental Status: She is alert and oriented to person, place, and time.  Psychiatric:        Behavior: Behavior normal.     Comments: Well groomed, good eye contact, normal speech and thoughts       Results for orders placed or performed in visit on 12/17/18  POCT Influenza A/B  Result Value Ref Range   Influenza A, POC Negative Negative   Influenza B, POC  Negative Negative      Assessment & Plan:   Problem List Items Addressed This Visit    Varicose veins of right lower extremity with pain   Binge eating   Relevant Medications   buPROPion (WELLBUTRIN XL) 150 MG 24 hr tablet    Other Visit Diagnoses    Abnormal weight gain    -  Primary   Relevant Medications   buPROPion (WELLBUTRIN XL) 150 MG 24 hr tablet   Overeating       Relevant Medications   buPROPion (WELLBUTRIN XL) 150 MG 24 hr tablet   Age-related cataract of both eyes, unspecified age-related cataract type       Relevant Orders   Ambulatory referral to Ophthalmology   Reduced vision       Relevant Orders   Ambulatory referral to Ophthalmology      #Abnormal weight gain, over eating / binge eating Chronic problem now worse with wt gain in past year, worse with home with COVID19 restrictions Continue Prozac Add Buproprion XL 150mg  daily - discussed risk benefit, for mood and can curb appetite. She cannot afford Contrave as discussed. Not good candidate for GLP1 for weight loss she declines injection. Advised lifestyle modifications - but she is requesting a medication.  #Cataracts, bilateral, reduced vision Referral to [facility] today  #RLE Localized Edema vs Induration / Pain Clinically  Not suggestive of DVT today at all. Well's Score for DVT (-1 prior DVT and alternative dx more likely) low risk or unlikely given exam today reassuring. - Reviewed RICE therapy. - Monitoring and follow-up as indicated if worsening, return criteria given  #LE Neuropathy chronic Progression of chronic problem similar discussion 2-3 years previous No longer w/ Neurology Increase Gabapentin to 5-6 pills at night max dose 1500-1800 per PM dose if tolerated as discussed. Offer Ropinirole again for possible RLS based on history - she will check cost coverage and let [facility] know, last time unable to afford it.  Orders Placed This Encounter  Procedures  . Ambulatory referral to  Ophthalmology    Referral Priority:   Routine    Referral Type:   Consultation    Referral Reason:   Specialty Services Required    Requested Specialty:   Ophthalmology    Number of Visits Requested:   1     Meds ordered this encounter  Medications  . buPROPion (WELLBUTRIN XL) 150 MG 24 hr tablet  Sig: Take 1 tablet (150 mg total) by mouth daily.    Dispense:  30 tablet    Refill:  2      Follow up plan: Return in about 6 weeks (around 07/06/2020) for 6 weeks to 3 month follow-up neuropathy, leg swelling, eating.   Nobie Putnam, [facility] 05/25/2020, 2:46 PM

## 2020-05-29 ENCOUNTER — Telehealth: Payer: Self-pay | Admitting: Family Medicine

## 2020-05-29 DIAGNOSIS — G2581 Restless legs syndrome: Secondary | ICD-10-CM

## 2020-05-29 MED ORDER — ROPINIROLE HCL 0.25 MG PO TABS: ORAL_TABLET | ORAL | 1 refills | Status: DC

## 2020-05-29 NOTE — Telephone Encounter (Signed)
Please notify her:  Ordered Ropinirole 0.25mg  to [facility]  Sent 90 pills  Start with 1 tab at bedtime. Gradually increase by 1 additional tab every 1 week, up to max of 4 tablets at bedtime  We can order more or change dose in future if it is working  Saralyn Pilar, DO [facility] 05/29/2020, 3:06 PM

## 2020-05-29 NOTE — Telephone Encounter (Signed)
Pt is calling and has check with her insurance [employer] and ropinirole 0.25 mg for her rls is covered. Please call new rx into [facility] [place]

## 2020-05-29 NOTE — Telephone Encounter (Signed)
Left message

## 2020-05-30 NOTE — Telephone Encounter (Signed)
I called [phone] to speak with Paige House regarding Dr. Althea Charon sending in an Rx for Ropinirole.  (message dated 05/29/2020 at 3:06PM) A female answered (her voice was young sounding, not like an 86 year old lady).  "I'm so drunk".  "You are awesome".  "Your voice sounds so soothing".  I asked her to tell me her name and birthday to verify I had her correct chart open.   She never would tell me her name even after asking her several times throughout the phone call. "My boyfriend, Paige House says my voice gets on his nerves".  Suddenly she says,  "Paige House I just spilled my wine all over my lap"!  "I love napkins"  "I know I'm talking a lot".   "I will be quiet and let you tell me what it is".     Then she would continue talking stuff that did not make sense.   She then asked me to make a 3 way call to her boyfriend, Paige House and for me to tell him...then she was interrupted.   I let her know I would call back later when she was not drunk.   As I was ending the call I heard her answer someone in the background,  "This is Paige House".  I then ended the call.  So I was not able to communicate the message from Dr. Althea Charon.

## 2020-05-30 NOTE — Telephone Encounter (Signed)
Verified  patient's DOB and notified that Rx send.

## 2020-06-01 ENCOUNTER — Telehealth: Payer: Self-pay

## 2020-06-01 NOTE — Telephone Encounter (Signed)
Acknowledged. Follow up as needed  Saralyn Pilar, DO [facility] 06/01/2020, 5:06 PM

## 2020-06-01 NOTE — Telephone Encounter (Signed)
Copied from CRM [id number]. Topic: General - Other >> Jun 01, 2020 12:23 PM Elliot Gault wrote: Patient was advised to follow up with PCP regarding swollen leg. Patient is following PCP recommendation regarding using a ice pack to decrease swelling and its working, patient states she will continue

## 2020-06-13 ENCOUNTER — Other Ambulatory Visit: Payer: Medicare Other

## 2020-06-22 ENCOUNTER — Ambulatory Visit (INDEPENDENT_AMBULATORY_CARE_PROVIDER_SITE_OTHER): Payer: Medicare Other | Admitting: Family Medicine

## 2020-06-22 ENCOUNTER — Other Ambulatory Visit: Payer: Self-pay

## 2020-06-22 ENCOUNTER — Encounter: Payer: Self-pay | Admitting: Family Medicine

## 2020-06-22 VITALS — BP 120/45 | HR 61 | Temp 97.1°F | Resp 16 | Ht 60.0 in | Wt 149.6 lb

## 2020-06-22 DIAGNOSIS — E559 Vitamin D deficiency, unspecified: Secondary | ICD-10-CM

## 2020-06-22 DIAGNOSIS — E782 Mixed hyperlipidemia: Secondary | ICD-10-CM | POA: Diagnosis not present

## 2020-06-22 DIAGNOSIS — N1831 Chronic kidney disease, stage 3a: Secondary | ICD-10-CM

## 2020-06-22 DIAGNOSIS — I83811 Varicose veins of right lower extremities with pain: Secondary | ICD-10-CM

## 2020-06-22 DIAGNOSIS — K589 Irritable bowel syndrome without diarrhea: Secondary | ICD-10-CM

## 2020-06-22 DIAGNOSIS — R7309 Other abnormal glucose: Secondary | ICD-10-CM | POA: Diagnosis not present

## 2020-06-22 DIAGNOSIS — G609 Hereditary and idiopathic neuropathy, unspecified: Secondary | ICD-10-CM | POA: Diagnosis not present

## 2020-06-22 DIAGNOSIS — E538 Deficiency of other specified B group vitamins: Secondary | ICD-10-CM | POA: Diagnosis not present

## 2020-06-22 DIAGNOSIS — R632 Polyphagia: Secondary | ICD-10-CM

## 2020-06-22 MED ORDER — DIPHENOXYLATE-ATROPINE 2.5-0.025 MG PO TABS: ORAL_TABLET | ORAL | 5 refills | Status: DC

## 2020-06-22 NOTE — Progress Notes (Signed)
Subjective:    Patient ID: Paige House, female    DOB: September 27, 1934, 86 y.o.   MRN: [id number]  Paige House is a 86 y.o. female presenting on 06/22/2020 for Irritable Bowel Syndrome   HPI   Due for lab work today.  History of Vitamin B12 / D deficiency Prior history vitamin Deficiency on supplement therapy. Due for repeat labs now. Off medication.  Early Bed Sore / Pressure Ulcer Stage 1 Gluteal Reader, sitting 6+ hours a day in one position Says she had developed some redness sore spot from prolong sitting fixed position. She was worried about bed sore, she got a foam egg sitter pad and it has improved. No new acute concerns. She declines for this area to be examined today.  IBS / Chronic Colitis episodic Reports chronic problem IBS for 30+ years on Lomotil rx medication Previously followed by Dr Servando Snare [facility] Previous work up and management. She was controlled on Lomotil PRN about 60 pills lasting 30 days, taking intermittently some days more often. Other days none. Last consultation in 2017. Prior colonoscopy Today request re order on Lomotil. She has never had problems or complications with it. If flare -diarrhea 6-7 times a day  Abnormal Weight Gain / Overeating History of binge eating - but no longer having problem with binges. Now she just says overeating due to at home with COVID19 pandemic. Taking Prozac 20mg  daily She was recently started on Wellbutrin XL 150mg  daily by me to help reduce craving/appetite and improve this problem. She has noticed significant improvement. She has lost 2-3 lbs and has appropriate appetite reduction.  History of CKD Due for labs.  Cataracts [facility] 08/03/20    Depression screen [facility] 2/9 06/22/2020 02/29/2020 11/15/2019  Decreased Interest 0 0 0  Down, Depressed, Hopeless 0 0 0  PHQ - 2 Score 0 0 0  Altered sleeping - - -  Tired, decreased energy - - -  Change in appetite - - -  Feeling bad or failure about yourself  -  - -  Trouble concentrating - - -  Moving slowly or fidgety/restless - - -  Suicidal thoughts - - -  PHQ-9 Score - - -  Difficult doing work/chores - - -    Past Medical History:  Diagnosis Date  . Anxiety   . Hyperlipidemia   . IBS (irritable bowel syndrome)    Past Surgical History:  Procedure Laterality Date  . ABDOMINAL HYSTERECTOMY    . APPENDECTOMY    . CHOLECYSTECTOMY     Social History   Socioeconomic History  . Marital status: Widowed    Spouse name: Not on file  . Number of children: Not on file  . Years of education: Not on file  . Highest education level: Not on file  Occupational History  . Occupation: retired  Tobacco Use  . Smoking status: Former Smoker    Start date: 07/26/1985  . Smokeless tobacco: Never Used  Substance and Sexual Activity  . Alcohol use: Yes    Alcohol/week: 0.0 standard drinks  . Drug use: No  . Sexual activity: Not on file  Other Topics Concern  . Not on file  Social History Narrative  . Not on file   Social Determinants of Health   Financial Resource Strain: Low Risk   . Difficulty of Paying Living Expenses: Not hard at all  Food Insecurity: No Food Insecurity  . Worried About 14/12/2018 in the Last Year: Never true  .  Ran Out of Food in the Last Year: Never true  Transportation Needs: No Transportation Needs  . Lack of Transportation (Medical): No  . Lack of Transportation (Non-Medical): No  Physical Activity: Insufficiently Active  . Days of Exercise per Week: 3 days  . Minutes of Exercise per Session: 30 min  Stress: No Stress Concern Present  . Feeling of Stress : Not at all  Social Connections: Moderately Isolated  . Frequency of Communication with Friends and Family: More than three times a week  . Frequency of Social Gatherings with Friends and Family: Never  . Attends Religious Services: More than 4 times per year  . Active Member of Clubs or Organizations: No  . Attends Banker Meetings:  Never  . Marital Status: Widowed  Intimate Partner Violence: Not At Risk  . Fear of Current or Ex-Partner: No  . Emotionally Abused: No  . Physically Abused: No  . Sexually Abused: No   Family History  Problem Relation Age of Onset  . COPD Daughter   . Coronary artery disease Daughter   . Coronary artery disease Son   . Obesity Daughter    Current Outpatient Medications on File Prior to Visit  Medication Sig  . acetaminophen (TYLENOL) 500 MG tablet Take 2 tabs up to 3 times a day for arthritis pain  . buPROPion (WELLBUTRIN XL) 150 MG 24 hr tablet Take 1 tablet (150 mg total) by mouth daily.  . cetirizine (ZYRTEC) 10 MG tablet Take 10 mg by mouth daily.  Marland Kitchen FLUoxetine (PROZAC) 20 MG capsule TAKE 1 CAPSULE BY MOUTH ONCE DAILY  . fluticasone (FLONASE) 50 MCG/ACT nasal spray Place 2 sprays into both nostrils daily. Use for 4-6 weeks then stop and use seasonally or as needed.  . gabapentin (NEURONTIN) 300 MG capsule TAKE 2 CAPSULES BY MOUTH IN THE MORNING , 2 CAPSULES IN THE AFTERNOON, AND 4  CAPSULES AT BEDTIME  . lisinopril (ZESTRIL) 5 MG tablet TAKE 1 TABLET BY MOUTH  DAILY  . meclizine (ANTIVERT) 25 MG tablet Take 1 tablet (25 mg total) by mouth daily as needed for dizziness.  . montelukast (SINGULAIR) 10 MG tablet Take 1 tablet (10 mg total) by mouth at bedtime.  . Multiple Vitamins-Minerals (ONE-A-DAY WOMENS 50 PLUS PO) Take by mouth.  . pravastatin (PRAVACHOL) 40 MG tablet Take 1 tablet (40 mg total) by mouth daily.  Marland Kitchen rOPINIRole (REQUIP) 0.25 MG tablet Start with 1 tablet at bedtime for restless leg. May increase every 1 week by 1 tablet up to max dose of 4 tablets at bedtime as tolerated.  . triamcinolone cream (KENALOG) 0.5 % Apply topically 2 (two) times daily. Up to 2 weeks as needed for eczema   No current facility-administered medications on file prior to visit.    Review of Systems Per HPI unless specifically indicated above      Objective:    BP (!) 120/45   Pulse  61   Temp (!) 97.1 F (36.2 C) (Temporal)   Resp 16   Ht 5' (1.524 m)   Wt 149 lb 9.6 oz (67.9 kg)   SpO2 97%   BMI 29.22 kg/m   Wt Readings from Last 3 Encounters:  06/22/20 149 lb 9.6 oz (67.9 kg)  02/29/20 152 lb 3.2 oz (69 kg)  08/03/18 140 lb (63.5 kg)    Physical Exam Vitals and nursing note reviewed.  Constitutional:      General: She is not in acute distress.    Appearance: She  is well-developed. She is not diaphoretic.     Comments: Well-appearing, comfortable, cooperative  HENT:     Head: Normocephalic and atraumatic.  Eyes:     General:        Right eye: No discharge.        Left eye: No discharge.     Conjunctiva/sclera: Conjunctivae normal.  Cardiovascular:     Rate and Rhythm: Normal rate.  Pulmonary:     Effort: Pulmonary effort is normal.  Skin:    General: Skin is warm and dry.     Findings: No erythema or rash.  Neurological:     Mental Status: She is alert and oriented to person, place, and time.  Psychiatric:        Behavior: Behavior normal.     Comments: Well groomed, good eye contact, normal speech and thoughts           Assessment & Plan:   Problem List Items Addressed This Visit    Vitamin D deficiency   Relevant Orders   VITAMIN D 25 Hydroxy (Vit-D Deficiency, Fractures)   Vitamin B12 deficiency   Relevant Orders   Vitamin B12   Varicose veins of right lower extremity with pain   Relevant Orders   COMPLETE METABOLIC PANEL WITH GFR   Neuropathy, peripheral   Relevant Orders   COMPLETE METABOLIC PANEL WITH GFR   Hyperlipidemia   Relevant Orders   Lipid panel   TSH   CKD (chronic kidney disease), stage III (HCC) - Primary   Relevant Orders   CBC with Differential/Platelet   COMPLETE METABOLIC PANEL WITH GFR   Binge eating   Relevant Orders   COMPLETE METABOLIC PANEL WITH GFR   Adaptive colitis   Relevant Medications   diphenoxylate-atropine (LOMOTIL) 2.5-0.025 MG tablet    Other Visit Diagnoses    Abnormal glucose         Relevant Orders   Hemoglobin A1c      Updated Health Maintenance information Order lab panel today, review results notify patient Encouraged improvement to lifestyle with diet and exercise Maintain healthy weight  #Binge Eating Continue Prozac and Wellbutrin XL 150 daily as rx, we can continue Wellbutrin if effective. She may consider dose adjust to 300 in future if indicated otherwise prefer 150 for now, she can notify us if need refills or 90 day sent to [facility] or [facility].  #IBS Colitis Previously followed by Dr Servando Snare [facility] Last seen 2017 Chronic managed on Lomotil PRN over 30+ years without complication Reviewed benefits risks and management on this problem. I agree to refill this med and monitor her as long as she remains stable and taking as prescribed. If too many flares or worsening problem, she will likely need to return to GI for consultation. We will hold off on referral today. Reviewed PDMP Rx sent to [facility] as requested 60 pills +5 refill  Meds ordered this encounter  Medications  . diphenoxylate-atropine (LOMOTIL) 2.5-0.025 MG tablet    Sig: TAKE 1 OR 2 TABLETS BY MOUTH 4 TIMES DAILY AS NEEDED DIARRHEA    Dispense:  60 tablet    Refill:  5      Follow up plan: Return in about 6 months (around 12/23/2020) for 6 month follow-up IBS med refill.  Saralyn Pilar, DO [facility] 06/22/2020, 11:54 AM

## 2020-06-22 NOTE — Patient Instructions (Addendum)
Thank you for coming to the office today.  Refilled Lomotil to [facility], 60 pills +5 refills  Stay tuned for blood test results.  No other change to medicines at this time.  Keep up the great work.  Keep wellbutrin XL 150 daily - right now it is 30 day only with refills at [facility] - if you need 90 days or additional refills or sent to mail order, or higher dose - let me know!   Please schedule a Follow-up Appointment to: Return in about 6 months (around 12/23/2020) for 6 month follow-up IBS med refill.  If you have any other questions or concerns, please feel free to call the office or send a message through MyChart. You may also schedule an earlier appointment if necessary.  Additionally, you may be receiving a survey about your experience at our office within a few days to 1 week by e-mail or mail. We value your feedback.  Saralyn Pilar, DO [facility], [place]

## 2020-06-23 LAB — LIPID PANEL
Cholesterol: 187 mg/dL (ref <200)
HDL: 56 mg/dL (ref >=50)
LDL Cholesterol (Calc): 107 mg/dL (calc) — ABNORMAL HIGH
Non-HDL Cholesterol (Calc): 131 mg/dL (calc) — ABNORMAL HIGH (ref <130)
Total CHOL/HDL Ratio: 3.3 (calc) (ref <5.0)
Triglycerides: 128 mg/dL (ref <150)

## 2020-06-23 LAB — COMPLETE METABOLIC PANEL WITH GFR
AG Ratio: 2.1 (calc) (ref 1.0–2.5)
ALT: 14 U/L (ref 6–29)
AST: 22 U/L (ref 10–35)
Albumin: 4.6 g/dL (ref 3.6–5.1)
Alkaline phosphatase (APISO): 60 U/L (ref 37–153)
BUN/Creatinine Ratio: 17 (calc) (ref 6–22)
BUN: 20 mg/dL (ref 7–25)
CO2: 29 mmol/L (ref 20–32)
Calcium: 9.4 mg/dL (ref 8.6–10.4)
Chloride: 104 mmol/L (ref 98–110)
Creat: 1.17 mg/dL — ABNORMAL HIGH (ref 0.60–0.88)
GFR, Est African American: 50 mL/min/{1.73_m2} — ABNORMAL LOW (ref >=60)
GFR, Est Non African American: 43 mL/min/{1.73_m2} — ABNORMAL LOW (ref >=60)
Globulin: 2.2 g/dL (calc) (ref 1.9–3.7)
Glucose, Bld: 95 mg/dL (ref 65–99)
Potassium: 4.6 mmol/L (ref 3.5–5.3)
Sodium: 140 mmol/L (ref 135–146)
Total Bilirubin: 1.1 mg/dL (ref 0.2–1.2)
Total Protein: 6.8 g/dL (ref 6.1–8.1)

## 2020-06-23 LAB — CBC WITH DIFFERENTIAL/PLATELET
Absolute Monocytes: 348 cells/uL (ref 200–950)
Basophils Absolute: 57 cells/uL (ref 0–200)
Basophils Relative: 1.1 %
Eosinophils Absolute: 140 cells/uL (ref 15–500)
Eosinophils Relative: 2.7 %
HCT: 41.1 % (ref 35.0–45.0)
Hemoglobin: 13.4 g/dL (ref 11.7–15.5)
Lymphs Abs: 1451 cells/uL (ref 850–3900)
MCH: 29.1 pg (ref 27.0–33.0)
MCHC: 32.6 g/dL (ref 32.0–36.0)
MCV: 89.3 fL (ref 80.0–100.0)
MPV: 11.3 fL (ref 7.5–12.5)
Monocytes Relative: 6.7 %
Neutro Abs: 3203 cells/uL (ref 1500–7800)
Neutrophils Relative %: 61.6 %
Platelets: 163 10*3/uL (ref 140–400)
RBC: 4.6 10*6/uL (ref 3.80–5.10)
RDW: 13.4 % (ref 11.0–15.0)
Total Lymphocyte: 27.9 %
WBC: 5.2 10*3/uL (ref 3.8–10.8)

## 2020-06-23 LAB — HEMOGLOBIN A1C
Hgb A1c MFr Bld: 5.4 % of total Hgb (ref <5.7)
Mean Plasma Glucose: 108 (calc)
eAG (mmol/L): 6 (calc)

## 2020-06-23 LAB — VITAMIN D 25 HYDROXY (VIT D DEFICIENCY, FRACTURES): Vit D, 25-Hydroxy: 27 ng/mL — ABNORMAL LOW (ref 30–100)

## 2020-06-23 LAB — TSH: TSH: 0.61 mIU/L (ref 0.40–4.50)

## 2020-06-23 LAB — VITAMIN B12: Vitamin B-12: 490 pg/mL (ref 200–1100)

## 2020-06-27 ENCOUNTER — Telehealth: Payer: Self-pay

## 2020-06-27 DIAGNOSIS — L89301 Pressure ulcer of unspecified buttock, stage 1: Secondary | ICD-10-CM

## 2020-06-27 MED ORDER — SILVER SULFADIAZINE 1 % EX CREA: 1.0000 "application " | TOPICAL_CREAM | Freq: Every day | CUTANEOUS | 0 refills | Status: DC

## 2020-06-27 NOTE — Telephone Encounter (Signed)
Please notify patient:  Sent rx Silvadene Cream - use daily for 7-10 days at a time, then may stop. Repeat if need.  She may also use regular "Barrier Cream" like a diaper cream or Desitin type cream - to help protect skin in future.  Goal is to off load the pressure avoid staying on one spot too long, changing position will help and keep using specialized cushion that she told me.  Saralyn Pilar, DO [facility] 06/27/2020, 6:51 PM

## 2020-06-27 NOTE — Telephone Encounter (Signed)
Copied from CRM [id number]. Topic: General - Inquiry >> Jun 27, 2020  3:09 PM Deborha Payment wrote: Reason for CRM: Patient states she would like medication for a sore on her buttocks.  Patient states she discussed this with PCP at last visit but patient forgot to finalize him calling her in something Pharmacy: [facility] Call back [phone]

## 2020-06-28 NOTE — Telephone Encounter (Signed)
Unable to reach the pt left message.

## 2020-06-29 NOTE — Telephone Encounter (Signed)
Patient called and given information below as noted by Dr. Althea Charon on 06/27/20, she verbalized understanding and says she's waiting on the delivery of the Silvadene Cream.   She asked for her lab results from last week. Given results as noted by Dr. Althea Charon on 06/25/20, she verbalized understanding. She asks what can she do to get her kidney function back down, she says she's taking lisinopril and it was working. She asked for a nurse to return the call after speaking to Dr. Althea Charon to let her know what to do.  Please contact patient to review the following (No MyChart Access):  1. Chemistry - Stable mild reduced kidney function, compared to last result 4 years ago. Otherwise chemistry normal including liver function. - Normal fasting blood sugar  2. Hemoglobin A1c (Diabetes screening) - 5.4, normal not in range of Pre-Diabetes (>5.7 to 6.4)   3. TSH Thyroid Function Tests - Normal.  4. Vitamin D - 27, slightly low, goal is >30. May take daily supplement Vitamin D3 1,000 to 2,000 once daily, or can take multivitamin with vitamin D.  5. Vitamin B12 - 490. Normal.  6. Cholesterol - Well controlled on Pravastatin 40mg .  7. CBC Blood Counts - Normal, no anemia, other abnormality  , DO [facility] 06/25/2020, 6:41 AM

## 2020-06-29 NOTE — Telephone Encounter (Signed)
Attempted to reach pt. at home number of [phone]; unable to reach pt.  Was advised by person answering, that this was a secure number, and would not confirm that this number was the same as [phone].

## 2020-07-01 ENCOUNTER — Other Ambulatory Visit: Payer: Self-pay | Admitting: Family Medicine

## 2020-07-24 ENCOUNTER — Other Ambulatory Visit: Payer: Self-pay | Admitting: Family Medicine

## 2020-07-24 DIAGNOSIS — R635 Abnormal weight gain: Secondary | ICD-10-CM

## 2020-07-24 DIAGNOSIS — R632 Polyphagia: Secondary | ICD-10-CM

## 2020-08-03 DIAGNOSIS — H268 Other specified cataract: Secondary | ICD-10-CM | POA: Diagnosis not present

## 2020-08-30 ENCOUNTER — Other Ambulatory Visit: Payer: Self-pay | Admitting: Family Medicine

## 2020-08-30 DIAGNOSIS — R632 Polyphagia: Secondary | ICD-10-CM

## 2020-08-30 DIAGNOSIS — R635 Abnormal weight gain: Secondary | ICD-10-CM

## 2020-09-06 ENCOUNTER — Other Ambulatory Visit: Payer: Self-pay | Admitting: Family Medicine

## 2020-09-12 ENCOUNTER — Telehealth: Payer: Self-pay | Admitting: Family Medicine

## 2020-09-12 DIAGNOSIS — N3281 Overactive bladder: Secondary | ICD-10-CM

## 2020-09-12 NOTE — Telephone Encounter (Signed)
Please notify patient that the Oxybutynin 5mg  was not automatically refilled since it had been taken off her med list previously.  We have addressed this issue few years ago, she is at risk for side effects on Oxybutynin with dry mouth / dry eyes etc, but if these are not bothering her much and she is tolerating the medication, I am willing to re order it.  1. I can re order Oxybutynin to [facility] if she prefers, but she should use with caution and prefer not every day, only as needed. But that is up to her if she needs to use it more often.  2. We can try again with Myrbetriq (higher cost but less side effects)  3. Or ask if she would like referral to Urologist for other treatment options.  Let me know how she would like to proceed.  , DO [facility] 09/12/2020, 6:41 PM

## 2020-09-12 NOTE — Telephone Encounter (Signed)
Pt was denied refill for oxybutynin (DITROPAN) 5 MG tablet Due to it being DC'd / Pt asked for a refill or a Rx for something else for her over active bladder/ please advise and call Pt  Pt states she is going to the bathroom a lot everyday and really needs something

## 2020-09-13 MED ORDER — MIRABEGRON ER 25 MG PO TB24: 25.0000 mg | ORAL_TABLET | Freq: Every day | ORAL | 1 refills | Status: DC

## 2020-09-13 NOTE — Telephone Encounter (Signed)
As per patient she would like Myrbetriq to be Rx for her overactive bladder since it has less side effect and she needs for every day.

## 2020-09-13 NOTE — Telephone Encounter (Signed)
Sent rx to [facility] for Myrbetriq 25mg  once daily, 90 day, we may have to switch it locally for 30 day based on insurance.  Also it may require a PA.  , DO [facility] 09/13/2020, 2:25 PM

## 2020-10-26 ENCOUNTER — Ambulatory Visit: Payer: Medicare Other

## 2020-10-31 ENCOUNTER — Ambulatory Visit: Payer: Medicare Other

## 2020-11-02 ENCOUNTER — Other Ambulatory Visit: Payer: Self-pay | Admitting: Family Medicine

## 2020-11-02 DIAGNOSIS — R632 Polyphagia: Secondary | ICD-10-CM

## 2020-11-02 DIAGNOSIS — R635 Abnormal weight gain: Secondary | ICD-10-CM

## 2020-11-20 ENCOUNTER — Ambulatory Visit (INDEPENDENT_AMBULATORY_CARE_PROVIDER_SITE_OTHER): Payer: Medicare Other

## 2020-11-20 VITALS — Ht 60.0 in | Wt 142.0 lb

## 2020-11-20 DIAGNOSIS — Z0000 Encounter for general adult medical examination without abnormal findings: Secondary | ICD-10-CM | POA: Diagnosis not present

## 2020-11-20 NOTE — Patient Instructions (Signed)
Paige House , Thank you for taking time to come for your Medicare Wellness Visit. I appreciate your ongoing commitment to your health goals. Please review the following plan we discussed and let me know if I can assist you in the future.   Screening recommendations/referrals: Colonoscopy: not required Mammogram: not required Bone Density: not required Recommended yearly ophthalmology/optometry visit for glaucoma screening and checkup Recommended yearly dental visit for hygiene and checkup  Vaccinations: Influenza vaccine: getting Friday Pneumococcal vaccine: completed 10/17/2014 Tdap vaccine: completed 12/15/2012 Shingles vaccine: discussed   Covid-19: 02/15/2020, 03/07/2020  Advanced directives: Please bring a copy of your POA (Power of Attorney) and/or Living Will to your next appointment.   Conditions/risks identified: none  Next appointment: Follow up in one year for your annual wellness visit    Preventive Care 65 Years and Older, Female Preventive care refers to lifestyle choices and visits with your health care provider that can promote health and wellness. What does preventive care include?  A yearly physical exam. This is also called an annual well check.  Dental exams once or twice a year.  Routine eye exams. Ask your health care provider how often you should have your eyes checked.  Personal lifestyle choices, including:  Daily care of your teeth and gums.  Regular physical activity.  Eating a healthy diet.  Avoiding tobacco and drug use.  Limiting alcohol use.  Practicing safe sex.  Taking low-dose aspirin every day.  Taking vitamin and mineral supplements as recommended by your health care provider. What happens during an annual well check? The services and screenings done by your health care provider during your annual well check will depend on your age, overall health, lifestyle risk factors, and family history of disease. Counseling  Your health care  provider may ask you questions about your:  Alcohol use.  Tobacco use.  Drug use.  Emotional well-being.  Home and relationship well-being.  Sexual activity.  Eating habits.  History of falls.  Memory and ability to understand (cognition).  Work and work Astronomer.  Reproductive health. Screening  You may have the following tests or measurements:  Height, weight, and BMI.  Blood pressure.  Lipid and cholesterol levels. These may be checked every 5 years, or more frequently if you are over 2 years old.  Skin check.  Lung cancer screening. You may have this screening every year starting at age 43 if you have a 30-pack-year history of smoking and currently smoke or have quit within the past 15 years.  Fecal occult blood test (FOBT) of the stool. You may have this test every year starting at age 31.  Flexible sigmoidoscopy or colonoscopy. You may have a sigmoidoscopy every 5 years or a colonoscopy every 10 years starting at age 30.  Hepatitis C blood test.  Hepatitis B blood test.  Sexually transmitted disease (STD) testing.  Diabetes screening. This is done by checking your blood sugar (glucose) after you have not eaten for a while (fasting). You may have this done every 1-3 years.  Bone density scan. This is done to screen for osteoporosis. You may have this done starting at age 12.  Mammogram. This may be done every 1-2 years. Talk to your health care provider about how often you should have regular mammograms. Talk with your health care provider about your test results, treatment options, and if necessary, the need for more tests. Vaccines  Your health care provider may recommend certain vaccines, such as:  Influenza vaccine. This is recommended every  year.  Tetanus, diphtheria, and acellular pertussis (Tdap, Td) vaccine. You may need a Td booster every 10 years.  Zoster vaccine. You may need this after age 59.  Pneumococcal 13-valent conjugate (PCV13)  vaccine. One dose is recommended after age 74.  Pneumococcal polysaccharide (PPSV23) vaccine. One dose is recommended after age 56. Talk to your health care provider about which screenings and vaccines you need and how often you need them. This information is not intended to replace advice given to you by your health care provider. Make sure you discuss any questions you have with your health care provider. Document Released: 12/28/2015 Document Revised: 08/20/2016 Document Reviewed: 10/02/2015 Elsevier Interactive Patient Education  2017 [employer] Prevention in the Home Falls can cause injuries. They can happen to people of all ages. There are many things you can do to make your home safe and to help prevent falls. What can I do on the outside of my home?  Regularly fix the edges of walkways and driveways and fix any cracks.  Remove anything that might make you trip as you walk through a door, such as a raised step or threshold.  Trim any bushes or trees on the path to your home.  Use bright outdoor lighting.  Clear any walking paths of anything that might make someone trip, such as rocks or tools.  Regularly check to see if handrails are loose or broken. Make sure that both sides of any steps have handrails.  Any raised decks and porches should have guardrails on the edges.  Have any leaves, snow, or ice cleared regularly.  Use sand or salt on walking paths during winter.  Clean up any spills in your garage right away. This includes oil or grease spills. What can I do in the bathroom?  Use night lights.  Install grab bars by the toilet and in the tub and shower. Do not use towel bars as grab bars.  Use non-skid mats or decals in the tub or shower.  If you need to sit down in the shower, use a plastic, non-slip stool.  Keep the floor dry. Clean up any water that spills on the floor as soon as it happens.  Remove soap buildup in the tub or shower  regularly.  Attach bath mats securely with double-sided non-slip rug tape.  Do not have throw rugs and other things on the floor that can make you trip. What can I do in the bedroom?  Use night lights.  Make sure that you have a light by your bed that is easy to reach.  Do not use any sheets or blankets that are too big for your bed. They should not hang down onto the floor.  Have a firm chair that has side arms. You can use this for support while you get dressed.  Do not have throw rugs and other things on the floor that can make you trip. What can I do in the kitchen?  Clean up any spills right away.  Avoid walking on wet floors.  Keep items that you use a lot in easy-to-reach places.  If you need to reach something above you, use a strong step stool that has a grab bar.  Keep electrical cords out of the way.  Do not use floor polish or wax that makes floors slippery. If you must use wax, use non-skid floor wax.  Do not have throw rugs and other things on the floor that can make you trip. What can  I do with my stairs?  Do not leave any items on the stairs.  Make sure that there are handrails on both sides of the stairs and use them. Fix handrails that are broken or loose. Make sure that handrails are as long as the stairways.  Check any carpeting to make sure that it is firmly attached to the stairs. Fix any carpet that is loose or worn.  Avoid having throw rugs at the top or bottom of the stairs. If you do have throw rugs, attach them to the floor with carpet tape.  Make sure that you have a light switch at the top of the stairs and the bottom of the stairs. If you do not have them, ask someone to add them for you. What else can I do to help prevent falls?  Wear shoes that:  Do not have high heels.  Have rubber bottoms.  Are comfortable and fit you well.  Are closed at the toe. Do not wear sandals.  If you use a stepladder:  Make sure that it is fully  opened. Do not climb a closed stepladder.  Make sure that both sides of the stepladder are locked into place.  Ask someone to hold it for you, if possible.  Clearly mark and make sure that you can see:  Any grab bars or handrails.  First and last steps.  Where the edge of each step is.  Use tools that help you move around (mobility aids) if they are needed. These include:  Canes.  Walkers.  Scooters.  Crutches.  Turn on the lights when you go into a dark area. Replace any light bulbs as soon as they burn out.  Set up your furniture so you have a clear path. Avoid moving your furniture around.  If any of your floors are uneven, fix them.  If there are any pets around you, be aware of where they are.  Review your medicines with your doctor. Some medicines can make you feel dizzy. This can increase your chance of falling. Ask your doctor what other things that you can do to help prevent falls. This information is not intended to replace advice given to you by your health care provider. Make sure you discuss any questions you have with your health care provider. Document Released: 09/27/2009 Document Revised: 05/08/2016 Document Reviewed: 01/05/2015 Elsevier Interactive Patient Education  2017 [employer].

## 2020-11-20 NOTE — Progress Notes (Signed)
I connected with Paige House today by telephone and verified that I am speaking with the correct person using two identifiers. Location patient: home Location provider: work Persons participating in the virtual visit: Paige House, Elisha Ponder LPN.   I discussed the limitations, risks, security and privacy concerns of performing an evaluation and management service by telephone and the availability of in person appointments. I also discussed with the patient that there may be a patient responsible charge related to this service. The patient expressed understanding and verbally consented to this telephonic visit.    Interactive audio and video telecommunications were attempted between this provider and patient, however failed, due to patient having technical difficulties OR patient did not have access to video capability.  We continued and completed visit with audio only.     Vital signs may be patient reported or missing  Subjective:   Paige House is a 86 y.o. female who presents for Medicare Annual (Subsequent) preventive examination.  Review of Systems     Cardiac Risk Factors include: advanced age (>46men, >28 women);dyslipidemia     Objective:    Today's Vitals   11/20/20 1436  Weight: 142 lb (64.4 kg)  Height: 5' (1.524 m)   Body mass index is 27.73 kg/m.  Advanced Directives 11/20/2020 11/15/2019 12/31/2015  Does Patient Have a Medical Advance Directive? Yes Yes Yes  Type of Estate agent of [place];Living will Living will;Healthcare Power of Attorney -  Copy of Healthcare Power of Attorney in Chart? No - copy requested No - copy requested -    Current Medications (verified) Outpatient Encounter Medications as of 11/20/2020  Medication Sig  . acetaminophen (TYLENOL) 500 MG tablet Take 2 tabs up to 3 times a day for arthritis pain  . buPROPion (WELLBUTRIN XL) 150 MG 24 hr tablet TAKE 1 TABLET BY MOUTH ONCE DAILY  . cetirizine  (ZYRTEC) 10 MG tablet Take 10 mg by mouth daily.  . diphenoxylate-atropine (LOMOTIL) 2.5-0.025 MG tablet TAKE 1 OR 2 TABLETS BY MOUTH 4 TIMES DAILY AS NEEDED DIARRHEA  . FLUoxetine (PROZAC) 20 MG capsule TAKE 1 CAPSULE BY MOUTH ONCE DAILY  . fluticasone (FLONASE) 50 MCG/ACT nasal spray Place 2 sprays into both nostrils daily. Use for 4-6 weeks then stop and use seasonally or as needed.  . gabapentin (NEURONTIN) 300 MG capsule TAKE 2 CAPSULES BY MOUTH IN THE MORNING , 2 CAPSULES IN THE AFTERNOON, AND 4  CAPSULES AT BEDTIME  . lisinopril (ZESTRIL) 5 MG tablet TAKE 1 TABLET BY MOUTH  DAILY  . meclizine (ANTIVERT) 25 MG tablet Take 1 tablet (25 mg total) by mouth daily as needed for dizziness.  . montelukast (SINGULAIR) 10 MG tablet Take 1 tablet (10 mg total) by mouth at bedtime.  . Multiple Vitamins-Minerals (ONE-A-DAY WOMENS 50 PLUS PO) Take by mouth.  . pravastatin (PRAVACHOL) 40 MG tablet Take 1 tablet (40 mg total) by mouth daily.  . silver sulfADIAZINE (SILVADENE) 1 % cream Apply 1 application topically daily. For 7-10 days, repeat as needed.  . triamcinolone cream (KENALOG) 0.5 % Apply topically 2 (two) times daily. Up to 2 weeks as needed for eczema  . mirabegron ER (MYRBETRIQ) 25 MG TB24 tablet Take 1 tablet (25 mg total) by mouth daily. (Patient not taking: Reported on 11/20/2020)  . rOPINIRole (REQUIP) 0.25 MG tablet Start with 1 tablet at bedtime for restless leg. May increase every 1 week by 1 tablet up to max dose of 4 tablets at bedtime as tolerated. (Patient  not taking: Reported on 11/20/2020)   No facility-administered encounter medications on file as of 11/20/2020.    Allergies (verified) Patient has no known allergies.   History: Past Medical History:  Diagnosis Date  . Anxiety   . Hyperlipidemia   . IBS (irritable bowel syndrome)    Past Surgical History:  Procedure Laterality Date  . ABDOMINAL HYSTERECTOMY    . APPENDECTOMY    . CHOLECYSTECTOMY     Family History   Problem Relation Age of Onset  . COPD Daughter   . Coronary artery disease Daughter   . Coronary artery disease Son   . Obesity Daughter    Social History   Socioeconomic History  . Marital status: Widowed    Spouse name: Not on file  . Number of children: Not on file  . Years of education: Not on file  . Highest education level: Not on file  Occupational History  . Occupation: retired  Tobacco Use  . Smoking status: Former Smoker    Start date: 07/26/1985  . Smokeless tobacco: Never Used  Vaping Use  . Vaping Use: Never used  Substance and Sexual Activity  . Alcohol use: Not Currently    Alcohol/week: 0.0 standard drinks  . Drug use: No  . Sexual activity: Not on file  Other Topics Concern  . Not on file  Social History Narrative  . Not on file   Social Determinants of Health   Financial Resource Strain: Low Risk   . Difficulty of Paying Living Expenses: Not hard at all  Food Insecurity: No Food Insecurity  . Worried About Programme researcher, broadcasting/film/video in the Last Year: Never true  . Ran Out of Food in the Last Year: Never true  Transportation Needs: No Transportation Needs  . Lack of Transportation (Medical): No  . Lack of Transportation (Non-Medical): No  Physical Activity: Insufficiently Active  . Days of Exercise per Week: 7 days  . Minutes of Exercise per Session: 20 min  Stress: No Stress Concern Present  . Feeling of Stress : Not at all  Social Connections:   . Frequency of Communication with Friends and Family: Not on file  . Frequency of Social Gatherings with Friends and Family: Not on file  . Attends Religious Services: Not on file  . Active Member of Clubs or Organizations: Not on file  . Attends Banker Meetings: Not on file  . Marital Status: Not on file    Tobacco Counseling Counseling given: Not Answered   Clinical Intake:  Pre-visit preparation completed: Yes  Pain : No/denies pain     Nutritional Status: BMI 25 -29 Overweight  Nutritional Risks: None Diabetes: No  How often do you need to have someone help you when you read instructions, pamphlets, or other written materials from your doctor or pharmacy?: 1 - Never What is the last grade level you completed in school?: 11th grade  Diabetic? no  Interpreter Needed?: No  Information entered by :: NAllen LPN   Activities of Daily Living In your present state of health, do you have any difficulty performing the following activities: 11/20/2020  Hearing? N  Vision? N  Difficulty concentrating or making decisions? N  Walking or climbing stairs? N  Dressing or bathing? N  Doing errands, shopping? Y  Comment friend takes to appointments and errands  Preparing Food and eating ? N  Using the Toilet? N  In the past six months, have you accidently leaked urine? N  Do you have  problems with loss of bowel control? N  Managing your Medications? N  Managing your Finances? N  Housekeeping or managing your Housekeeping? N  Some recent data might be hidden    Patient Care Team: Smitty Cords, DO as PCP - General (Family Medicine)  Indicate any recent Medical Services you may have received from other than [facility] providers in the past year (date may be approximate).     Assessment:   This is a routine wellness examination for [place].  Hearing/Vision screen  Hearing Screening   125Hz  250Hz  500Hz  1000Hz  2000Hz  3000Hz  4000Hz  6000Hz  8000Hz   Right ear:           Left ear:           Vision Screening Comments: Regular eye exams, [facility]  Dietary issues and exercise activities discussed: Current Exercise Habits: Home exercise routine, Type of exercise: walking, Time (Minutes): 20, Frequency (Times/Week): 7, Weekly Exercise (Minutes/Week): 140  Goals    . Patient Stated     11/20/2020, no goals      Depression Screen PHQ 2/9 Scores 11/20/2020 06/22/2020 02/29/2020 11/15/2019 11/15/2019 01/31/2019 12/17/2018  PHQ - 2 Score 0 0 0 0 0 0 0  PHQ- 9  Score - - - - - - -    Fall Risk Fall Risk  11/20/2020 06/22/2020 05/25/2020 02/29/2020 11/15/2019  Falls in the past year? 0 0 0 0 0  Number falls in past yr: - 0 0 0 0  Injury with Fall? - 0 0 0 0  Risk for fall due to : Medication side effect - - - -  Follow up Falls evaluation completed;Education provided;Falls prevention discussed Falls evaluation completed Falls evaluation completed Falls evaluation completed -    FALL RISK PREVENTION PERTAINING TO THE HOME:  Any stairs in or around the home? No  If so, are there any without handrails? n/a Home free of loose throw rugs in walkways, pet beds, electrical cords, etc? Yes  Adequate lighting in your home to reduce risk of falls? Yes   ASSISTIVE DEVICES UTILIZED TO PREVENT FALLS:  Life alert? Yes  Use of a cane, walker or w/c? No  Grab bars in the bathroom? No  Shower chair or bench in shower? Yes  Elevated toilet seat or a handicapped toilet? No   TIMED UP AND GO:  Was the test performed? No . .  Cognitive Function:     6CIT Screen 11/20/2020  What Year? 0 points  What month? 0 points  What time? 0 points  Count back from 20 0 points  Months in reverse 0 points  Repeat phrase 0 points  Total Score 0    Immunizations Immunization History  Administered Date(s) Administered  . Fluad Quad(high Dose 65+) 11/03/2019  . Influenza, High Dose Seasonal PF 09/25/2015, 10/09/2017    TDAP status: Up to date  Flu Vaccine status: Due, Education has been provided regarding the importance of this vaccine. Advised may receive this vaccine at local pharmacy or Health Dept. Aware to provide a copy of the vaccination record if obtained from local pharmacy or Health Dept. Verbalized acceptance and understanding.  Pneumococcal vaccine status: Up to date  Covid-19 vaccine status: Completed vaccines  Qualifies for Shingles Vaccine? Yes   Zostavax completed No   Shingrix Completed?: No.    Education has been provided regarding the  importance of this vaccine. Patient has been advised to call insurance company to determine out of pocket expense if they have not yet received  this vaccine. Advised may also receive vaccine at local pharmacy or Health Dept. Verbalized acceptance and understanding.  Screening Tests Health Maintenance  Topic Date Due  . COVID-19 Vaccine (1) Never done  . INFLUENZA VACCINE  07/15/2020  . TETANUS/TDAP  12/15/2022  . PNA vac Low Risk Adult  Completed  . DEXA SCAN  Discontinued    Health Maintenance  Health Maintenance Due  Topic Date Due  . COVID-19 Vaccine (1) Never done  . INFLUENZA VACCINE  07/15/2020    Colorectal cancer screening: No longer required.   Mammogram status: No longer required due to age.  Bone Density status: not required  Lung Cancer Screening: (Low Dose CT Chest recommended if Age 66-80 years, 30 pack-year currently smoking OR have quit w/in 15years.) does not qualify.   Lung Cancer Screening Referral: no  Additional Screening:  Hepatitis C Screening: does not qualify;   Vision Screening: Recommended annual ophthalmology exams for early detection of glaucoma and other disorders of the eye. Is the patient up to date with their annual eye exam?  Yes  Who is the provider or what is the name of the office in which the patient attends annual eye exams? [facility] If pt is not established with a provider, would they like to be referred to a provider to establish care? No .   Dental Screening: Recommended annual dental exams for proper oral hygiene  Community Resource Referral / Chronic Care Management: CRR required this visit?  Yes   CCM required this visit?  No      Plan:     I have personally reviewed and noted the following in the patient's chart:   . Medical and social history . Use of alcohol, tobacco or illicit drugs  . Current medications and supplements . Functional ability and status . Nutritional status . Physical activity .  Advanced directives . List of other physicians . Hospitalizations, surgeries, and ER visits in previous 12 months . Vitals . Screenings to include cognitive, depression, and falls . Referrals and appointments  In addition, I have reviewed and discussed with patient certain preventive protocols, quality metrics, and best practice recommendations. A written personalized care plan for preventive services as well as general preventive health recommendations were provided to patient.     Barb Merinoickeah E Jaala Bohle, LPN   16/1/096012/06/2020   Nurse Notes:

## 2020-11-23 ENCOUNTER — Telehealth: Payer: Self-pay | Admitting: Family Medicine

## 2020-11-23 NOTE — Telephone Encounter (Signed)
Patient returning Amil Amen please return patient call [phone]

## 2020-11-23 NOTE — Telephone Encounter (Signed)
Julia Kluetz 11/23/2020 Called pt regarding community resource referral received. Left message for pt to call me back, my info is [phone] please see ref notes for more details.  Julia Kluetz Care Guide, Embedded Care Coordination [place], Care Management    

## 2020-11-29 ENCOUNTER — Telehealth: Payer: Self-pay | Admitting: Family Medicine

## 2020-11-29 NOTE — Telephone Encounter (Signed)
   Referral Reason: Transportation Needs  and Food Insecurity  Interventions: Successful outbound call placed to the patient Patient provided with information about care guide support team and interviewed to confirm resource needs Discussed resources to assist with food shortage each month and transportation in her area.  Obtained verbal consent to place patient referral to [employer]  Placed referral to [employer] via email.  Follow up plan: No further follow up planned at this time. The patient has been provided with needed resources. and email was sent to patient with transportation resources and food pantries in her area.    My contact information is Rojelio Brenner [phone]

## 2020-12-17 ENCOUNTER — Other Ambulatory Visit: Payer: Self-pay | Admitting: Family Medicine

## 2020-12-17 DIAGNOSIS — G609 Hereditary and idiopathic neuropathy, unspecified: Secondary | ICD-10-CM

## 2020-12-17 DIAGNOSIS — H8111 Benign paroxysmal vertigo, right ear: Secondary | ICD-10-CM

## 2020-12-18 ENCOUNTER — Other Ambulatory Visit: Payer: Self-pay | Admitting: Family Medicine

## 2020-12-18 DIAGNOSIS — K589 Irritable bowel syndrome without diarrhea: Secondary | ICD-10-CM
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
## 2021-01-24 ENCOUNTER — Encounter: Payer: Self-pay | Admitting: Family Medicine

## 2021-01-24 ENCOUNTER — Other Ambulatory Visit: Payer: Self-pay

## 2021-01-24 ENCOUNTER — Ambulatory Visit (INDEPENDENT_AMBULATORY_CARE_PROVIDER_SITE_OTHER): Payer: Medicare Other | Admitting: Family Medicine

## 2021-01-24 VITALS — BP 141/91 | HR 59 | Temp 97.5°F | Ht 60.0 in | Wt 142.0 lb

## 2021-01-24 DIAGNOSIS — G609 Hereditary and idiopathic neuropathy, unspecified: Secondary | ICD-10-CM

## 2021-01-24 DIAGNOSIS — R632 Polyphagia: Secondary | ICD-10-CM

## 2021-01-24 DIAGNOSIS — R635 Abnormal weight gain: Secondary | ICD-10-CM

## 2021-01-24 DIAGNOSIS — G2581 Restless legs syndrome: Secondary | ICD-10-CM | POA: Diagnosis not present

## 2021-01-24 DIAGNOSIS — M79605 Pain in left leg: Secondary | ICD-10-CM | POA: Diagnosis not present

## 2021-01-24 DIAGNOSIS — J3089 Other allergic rhinitis: Secondary | ICD-10-CM | POA: Diagnosis not present

## 2021-01-24 DIAGNOSIS — F5081 Binge eating disorder: Secondary | ICD-10-CM

## 2021-01-24 MED ORDER — FLUOXETINE HCL 20 MG PO CAPS: ORAL_CAPSULE | ORAL | 1 refills | Status: DC

## 2021-01-24 MED ORDER — IPRATROPIUM BROMIDE 0.06 % NA SOLN: 2.0000 | Freq: Four times a day (QID) | NASAL | 3 refills | Status: DC

## 2021-01-24 MED ORDER — ROPINIROLE HCL 0.5 MG PO TABS: 0.5000 mg | ORAL_TABLET | Freq: Every day | ORAL | 0 refills | Status: DC

## 2021-01-24 MED ORDER — TRAMADOL HCL 50 MG PO TABS: 50.0000 mg | ORAL_TABLET | Freq: Three times a day (TID) | ORAL | 0 refills | Status: DC | PRN

## 2021-01-24 MED ORDER — BUPROPION HCL ER (XL) 150 MG PO TB24
150.0000 mg | ORAL_TABLET | Freq: Every day | ORAL | 1 refills | Status: DC
Start: 2021-01-24 — End: 2021-10-16

## 2021-01-24 NOTE — Progress Notes (Signed)
Subjective:    Patient ID: Paige House, female    DOB: 1934-01-03, 87 y.o.   MRN: [id number]  Paige House is a 87 y.o. female presenting on 01/24/2021 for Leg Pain (On left leg and the pain is right below the knee but it started from the ankle.It happens always during night time.) and Foot Pain   HPI  RLS Peripheral Neuropathy Chronic problems, now has recent flare up worse on Left side lower leg pain Stressed out due to leg / knee pain - keeping her awake, difficulty sleeping Describes pins needles tingling in foot and lower leg at times, episodic, not constant, worse at night. Feels restless leg keeping her up and pain She has had Tramadol in past PRN. For other reasons not tried for this Takes gabapentin 300mg  x 2 in AM and x 2 in PM and x 4 at bedtime. She never was able to get Requip due to cost. Now wants to try again In past has seen Neurology Denies any leg pain, calf pain, redness, swelling, asymmetry, constant numbness tingling or weakness  Chronic Sinus Drainage Admits cough at night with post nasal drainage Past tried Claritin, Cetrizine, Singulair without relief. Still using Flonase regularly.  Depression screen [facility] 2/9 11/20/2020 06/22/2020 02/29/2020  Decreased Interest 0 0 0  Down, Depressed, Hopeless 0 0 0  PHQ - 2 Score 0 0 0  Altered sleeping - - -  Tired, decreased energy - - -  Change in appetite - - -  Feeling bad or failure about yourself  - - -  Trouble concentrating - - -  Moving slowly or fidgety/restless - - -  Suicidal thoughts - - -  PHQ-9 Score - - -  Difficult doing work/chores - - -    Social History   Tobacco Use  . Smoking status: Former Smoker    Start date: 07/26/1985  . Smokeless tobacco: Never Used  Vaping Use  . Vaping Use: Never used  Substance Use Topics  . Alcohol use: Not Currently    Alcohol/week: 0.0 standard drinks  . Drug use: No    Review of Systems Per HPI unless specifically indicated above      Objective:    BP (!) 141/91   Pulse (!) 59   Temp (!) 97.5 F (36.4 C) (Temporal)   Ht 5' (1.524 m)   Wt 142 lb (64.4 kg)   SpO2 98%   BMI 27.73 kg/m   Wt Readings from Last 3 Encounters:  01/24/21 142 lb (64.4 kg)  11/20/20 142 lb (64.4 kg)  06/22/20 149 lb 9.6 oz (67.9 kg)    Physical Exam Vitals and nursing note reviewed.  Constitutional:      General: She is not in acute distress.    Appearance: She is well-developed and well-nourished. She is not diaphoretic.     Comments: Well-appearing, comfortable, cooperative  HENT:     Head: Normocephalic and atraumatic.     Mouth/Throat:     Mouth: Oropharynx is clear and moist.  Eyes:     General:        Right eye: No discharge.        Left eye: No discharge.     Conjunctiva/sclera: Conjunctivae normal.  Cardiovascular:     Rate and Rhythm: Normal rate.  Pulmonary:     Effort: Pulmonary effort is normal.  Musculoskeletal:        General: No edema.     Comments: Left knee without tenderness of joint,  no edema or effusion, or erythema, full ROM without limitation.  Lower extremity normal without edema on left, no erythema, no rash. Full range of motion. Intact sensation to light touch  Skin:    General: Skin is warm and dry.     Findings: No erythema or rash.  Neurological:     Mental Status: She is alert and oriented to person, place, and time.  Psychiatric:        Mood and Affect: Mood and affect normal.        Behavior: Behavior normal.     Comments: Well groomed, good eye contact, normal speech and thoughts    Results for orders placed or performed in visit on 06/22/20  Hemoglobin A1c  Result Value Ref Range   Hgb A1c MFr Bld 5.4 <5.7 % of total Hgb   Mean Plasma Glucose 108 (calc)   eAG (mmol/L) 6.0 (calc)  CBC with Differential/Platelet  Result Value Ref Range   WBC 5.2 3.8 - 10.8 Thousand/uL   RBC 4.60 3.80 - 5.10 Million/uL   Hemoglobin 13.4 11.7 - 15.5 g/dL   HCT 44.0 34.7 - 42.5 %   MCV 89.3 80.0 -  100.0 fL   MCH 29.1 27.0 - 33.0 pg   MCHC 32.6 32.0 - 36.0 g/dL   RDW 95.6 38.7 - 56.4 %   Platelets 163 140 - 400 Thousand/uL   MPV 11.3 7.5 - 12.5 fL   Neutro Abs 3,203 1,500 - 7,800 cells/uL   Lymphs Abs 1,451 850 - 3,900 cells/uL   Absolute Monocytes 348 200 - 950 cells/uL   Eosinophils Absolute 140 15 - 500 cells/uL   Basophils Absolute 57 0 - 200 cells/uL   Neutrophils Relative % 61.6 %   Total Lymphocyte 27.9 %   Monocytes Relative 6.7 %   Eosinophils Relative 2.7 %   Basophils Relative 1.1 %  COMPLETE METABOLIC PANEL WITH GFR  Result Value Ref Range   Glucose, Bld 95 65 - 99 mg/dL   BUN 20 7 - 25 mg/dL   Creat 3.32 (H) 9.51 - 0.88 mg/dL   GFR, Est Non African American 43 (L) > OR = 60 mL/min/1.68m2   GFR, Est African American 50 (L) > OR = 60 mL/min/1.36m2   BUN/Creatinine Ratio 17 6 - 22 (calc)   Sodium 140 135 - 146 mmol/L   Potassium 4.6 3.5 - 5.3 mmol/L   Chloride 104 98 - 110 mmol/L   CO2 29 20 - 32 mmol/L   Calcium 9.4 8.6 - 10.4 mg/dL   Total Protein 6.8 6.1 - 8.1 g/dL   Albumin 4.6 3.6 - 5.1 g/dL   Globulin 2.2 1.9 - 3.7 g/dL (calc)   AG Ratio 2.1 1.0 - 2.5 (calc)   Total Bilirubin 1.1 0.2 - 1.2 mg/dL   Alkaline phosphatase (APISO) 60 37 - 153 U/L   AST 22 10 - 35 U/L   ALT 14 6 - 29 U/L  Lipid panel  Result Value Ref Range   Cholesterol 187 <200 mg/dL   HDL 56 > OR = 50 mg/dL   Triglycerides 884 <166 mg/dL   LDL Cholesterol (Calc) 107 (H) mg/dL (calc)   Total CHOL/HDL Ratio 3.3 <5.0 (calc)   Non-HDL Cholesterol (Calc) 131 (H) <130 mg/dL (calc)  TSH  Result Value Ref Range   TSH 0.61 0.40 - 4.50 mIU/L  Vitamin B12  Result Value Ref Range   Vitamin B-12 490 200 - 1,100 pg/mL  VITAMIN D 25 Hydroxy (Vit-D Deficiency, Fractures)  Result Value Ref Range   Vit D, 25-Hydroxy 27 (L) 30 - 100 ng/mL      Assessment & Plan:   Problem List Items Addressed This Visit    Restless leg syndrome - Primary   Relevant Medications   rOPINIRole (REQUIP) 0.5 MG  tablet   traMADol (ULTRAM) 50 MG tablet   Neuropathy, peripheral   Relevant Medications   rOPINIRole (REQUIP) 0.5 MG tablet   traMADol (ULTRAM) 50 MG tablet   buPROPion (WELLBUTRIN XL) 150 MG 24 hr tablet   FLUoxetine (PROZAC) 20 MG capsule   Binge eating   Relevant Medications   buPROPion (WELLBUTRIN XL) 150 MG 24 hr tablet   Allergic rhinitis   Relevant Medications   ipratropium (ATROVENT) 0.06 % nasal spray    Other Visit Diagnoses    Left leg pain       Relevant Medications   traMADol (ULTRAM) 50 MG tablet   Abnormal weight gain       Relevant Medications   buPROPion (WELLBUTRIN XL) 150 MG 24 hr tablet   Overeating       Relevant Medications   buPROPion (WELLBUTRIN XL) 150 MG 24 hr tablet   Binge eating disorder       Relevant Medications   FLUoxetine (PROZAC) 20 MG capsule      #RLS #Peripheral Neuropathy Chronic Leg Pain  Chronic problems, seems multifactorial - ultimately related to neuropathic pain symptoms and with RLS associated. Keeping her awake  Trial again on Ropinirole - will send generic rx 0.5mg  take nightly can increase to 2 for 1mg  if need, sent 90 day to [facility], she can increase by 1 pill weekly until at desired effect. We can order stronger dose when ready. If not covered again she can get it at [facility] will need to send new rx Continue Gabapentin, she asks to reduce dose during day that is fine, she can adjust Gabapentin dose. Will add Tramadol 50mg  PRN prefer only night time dosing for acute flare of pain with symptoms / neuropathy RLS and can help her sleep - considered Baclofen muscle relaxant vs Flexeril PRN night time and can help sleep will defer for now and trial short term Tramadol, if successful can re order.  #Sinusitis Start Atrovent nasal spray decongestant 2 sprays in each nostril up to 4 times daily PRN  #Overeating/Binge Eating Refill current Wellbutrin XL 150mg  and Fluoxetine - refill for 90 days  Meds ordered this encounter   Medications  . rOPINIRole (REQUIP) 0.5 MG tablet    Sig: Take 1 tablet (0.5 mg total) by mouth at bedtime. May increase to 2 pills = 1mg  nightly if needed.    Dispense:  90 tablet    Refill:  0  . traMADol (ULTRAM) 50 MG tablet    Sig: Take 1 tablet (50 mg total) by mouth every 8 (eight) hours as needed for moderate pain.    Dispense:  20 tablet    Refill:  0  . ipratropium (ATROVENT) 0.06 % nasal spray    Sig: Place 2 sprays into both nostrils 4 (four) times daily.    Dispense:  45 mL    Refill:  3  . buPROPion (WELLBUTRIN XL) 150 MG 24 hr tablet    Sig: Take 1 tablet (150 mg total) by mouth daily.    Dispense:  90 tablet    Refill:  1  . FLUoxetine (PROZAC) 20 MG capsule    Sig: TAKE 1 CAPSULE BY MOUTH ONCE DAILY  Dispense:  90 capsule    Refill:  1      Follow up plan: Return if symptoms worsen or fail to improve.    Saralyn Pilar, DO [facility] 01/24/2021, 11:26 AM

## 2021-01-24 NOTE — Patient Instructions (Addendum)
Thank you for coming to the office today.  For the Restless Legs Start the Ropiniorole 0.5mg  once at bedtime for 1 week, then can increase to 2 pills = 1mg  nightly. May keep increasing up to max of 4 pills if need, we can order higher dose pill if you prefer.  If insurance does not cover that one, we can order it at [facility] for a discount.  For pain - / stiffness can take Tramadol as needed at bedtime. Let me know if it works and you need a refill - we can take care of that.  Otherwise, if you prefer to try the other one - muscle relaxant - let me know we can switch it to a Baclofen muscle relaxant in the future.  Keep on Gabapentin, may stop during the day if you prefer, see if this is helping or not.  Start Atrovent nasal spray decongestant 2 sprays in each nostril up to 4 times daily as needed for nasal drainage.   Please schedule a Follow-up Appointment to: Return if symptoms worsen or fail to improve.  If you have any other questions or concerns, please feel free to call the office or send a message through MyChart. You may also schedule an earlier appointment if necessary.  Additionally, you may be receiving a survey about your experience at our office within a few days to 1 week by e-mail or mail. We value your feedback.  [facility], DO [facility], [facility]

## 2021-01-25 ENCOUNTER — Telehealth: Payer: Self-pay | Admitting: Family Medicine

## 2021-01-25 DIAGNOSIS — J3089 Other allergic rhinitis: Secondary | ICD-10-CM

## 2021-01-25 MED ORDER — AZELASTINE HCL 0.1 % NA SOLN: 1.0000 | Freq: Two times a day (BID) | NASAL | 3 refills | Status: DC

## 2021-01-25 NOTE — Telephone Encounter (Signed)
Switched Atrovent nasal to Azelastine [facility]  Saralyn Pilar, DO [facility] 01/25/2021, 11:57 AM

## 2021-01-25 NOTE — Telephone Encounter (Signed)
There isn't a similar option to Atrovent nasal.  The only other one would be a an allergy nasal spray, it is not an exact same - decongestant type medication.  We could try to send it to [facility]  OR I would recommend that we can send the Atrovent to [place] or other pharmacy locally  Let me know what she would like to do.  Saralyn Pilar, DO [facility] 01/25/2021, 9:59 AM

## 2021-01-25 NOTE — Telephone Encounter (Signed)
Pt is calling and [facility] sent her a text that ipratropium 0.06% nasal spray is not available and they do not know when they will have in stock. Pt would like another nasal spray sent to [facility]

## 2021-01-25 NOTE — Telephone Encounter (Signed)
The pt would like the Allergy nasal spray prescription sent over to [facility] mail order pharmacy.

## 2021-01-28 ENCOUNTER — Telehealth: Payer: Self-pay | Admitting: Family Medicine

## 2021-01-28 NOTE — Telephone Encounter (Signed)
Error/pt calling about same thing as previous message

## 2021-02-01 ENCOUNTER — Telehealth: Payer: Self-pay

## 2021-02-01 DIAGNOSIS — G2581 Restless legs syndrome: Secondary | ICD-10-CM

## 2021-02-01 MED ORDER — BACLOFEN 10 MG PO TABS: 10.0000 mg | ORAL_TABLET | Freq: Every evening | ORAL | 1 refills | Status: DC | PRN

## 2021-02-01 NOTE — Telephone Encounter (Signed)
Copied from CRM [id number]. Topic: General - Other >> Feb 01, 2021 11:46 AM Herby Abraham C wrote: Reason for CRM: pt called in to make provider aware that the traMADol (ULTRAM) 50 MG tablet isn't helping. Pt says that she was told that if medication didn't help provider would change her to a muscle relaxer. Pt would like to have a Rx for the muscle relaxer sent in to mail order.   Pharmacy: [facility] - [place], [place] - [address] [place], [address]  Phone:  [phone] Fax:  [phone]

## 2021-02-12 ENCOUNTER — Other Ambulatory Visit: Payer: Self-pay | Admitting: Family Medicine

## 2021-02-12 DIAGNOSIS — E782 Mixed hyperlipidemia: Secondary | ICD-10-CM

## 2021-02-15 ENCOUNTER — Telehealth: Payer: Self-pay

## 2021-02-15 DIAGNOSIS — G2581 Restless legs syndrome: Secondary | ICD-10-CM

## 2021-02-15 MED ORDER — ROPINIROLE HCL 2 MG PO TABS
2.0000 mg | ORAL_TABLET | Freq: Every day | ORAL | 1 refills | Status: AC
Start: 2021-02-15 — End: ?

## 2021-02-15 NOTE — Telephone Encounter (Signed)
I called the patient and she complains of a bilateral leg heaviness only at bedtime. She is currently taking Requip 0.5MG   (2) two tablets at bedtime. I recommended that the patient schedule a f/u visit . She said she will call back to schedule because she have to arrange transportation.

## 2021-02-15 NOTE — Telephone Encounter (Signed)
Dose increase from 1mg  (Ropinirole 0.5mg  x2 = 1mg ) now up to 2mg  Ropinirole new rx [facility]  , DO [facility] 02/15/2021, 5:10 PM

## 2021-02-15 NOTE — Telephone Encounter (Signed)
Copied from CRM [id number]. Topic: General - Other >> Feb 15, 2021  2:46 PM Gaetana Michaelis A wrote: Reason for CRM: Patient called to express concern with a heavy feeling in their leg They're experiencing the heaviness at night specifically Patient would like a prescription submitted to [facility] Patient was last seen 01/24/21 in office with PCP, and declined to make an additional appt  Please contact to advise

## 2021-02-21 ENCOUNTER — Telehealth: Payer: Self-pay

## 2021-02-21 DIAGNOSIS — J3089 Other allergic rhinitis: Secondary | ICD-10-CM

## 2021-02-21 MED ORDER — IPRATROPIUM BROMIDE 0.03 % NA SOLN
2.0000 | Freq: Three times a day (TID) | NASAL | 1 refills | Status: DC
Start: 2021-02-21 — End: 2021-04-25

## 2021-02-21 NOTE — Telephone Encounter (Signed)
Copied from CRM [id number]. Topic: General - Other >> Feb 21, 2021 12:05 PM Paige House A wrote: Reason for CRM: Patient called in to inform Dr Kirtland Bouchard that the azelastine (ASTELIN) 0.1 % nasal spray is not working and that  ipratropium (ATROVENT) 0.03 % nasal spray. Per patient she spoke to [facility] and they have the ipratropium (ATROVENT) 0.03 % nasal spray need Dr Kirtland Bouchard to please send an Rx over for that so they can send it out to her. Call with questions  Ph# [phone]

## 2021-03-04 ENCOUNTER — Telehealth: Payer: Self-pay

## 2021-03-04 NOTE — Telephone Encounter (Signed)
Copied from CRM [id number]. Topic: General - Inquiry >> Mar 04, 2021  2:49 PM Aretta Nip wrote: Reason for CRM: baclofen (LIORESAL) 10 MG tablet and gabapentin (NEURONTIN) 300 MG capsule 720 capsule 3 12/19/2020 Pt was called by [facility] this morning and was told to call her dr and get an answer before she went to bed tonite. She states that they said she should not take both of these at nite as could cause breathing problems. Now pt is afraid to take her meds tonite. She said they told her to call her dr asap. Please have nurse reach out as this has really made her anxious. FU at [phone]    [facility] [place], [place] - [address] [place], [address]  Phone:  [phone] Fax:  [phone]

## 2021-03-04 NOTE — Telephone Encounter (Signed)
First if you can clarify if she has taken both before at night.  If she has done fine on it, then it is no problem. She should just be AWARE that potentially taking multiple medications can cause increased sedation which can impact her breathing.  She can also reduce her Gabapentin dosage at night if she wants to try 1-2 pills of Gabapentin and then add the Baclofen as well.  She can also reduce Baclofen to HALF tablet for 5mg  instead of 10mg .  She can try it first to see, follow up if questions  , DO [facility] 03/04/2021, 4:23 PM

## 2021-03-04 NOTE — Telephone Encounter (Signed)
The pt was notified of Dr. K recommendation. She verbalize understanding, no questions or concerns.  

## 2021-04-08 ENCOUNTER — Telehealth: Payer: Self-pay

## 2021-04-08 DIAGNOSIS — G2581 Restless legs syndrome: Secondary | ICD-10-CM

## 2021-04-08 DIAGNOSIS — M62838 Other muscle spasm: Secondary | ICD-10-CM

## 2021-04-08 MED ORDER — METHOCARBAMOL 500 MG PO TABS: 500.0000 mg | ORAL_TABLET | Freq: Every evening | ORAL | 3 refills | Status: AC | PRN

## 2021-04-08 NOTE — Telephone Encounter (Signed)
Stop taking Baclofen.  New rx Sent for Methocarbamol (Robaxin) 500mg  nightly to [facility].  , DO [facility] 04/08/2021, 1:23 PM

## 2021-04-08 NOTE — Telephone Encounter (Signed)
Copied from CRM [id number]. Topic: General - Other >> Apr 08, 2021  8:45 AM Jaquita Rector A wrote: Reason for CRM: Patient called to inform Dr Kirtland Bouchard that she can no longer take Rx baclofen (LIORESAL) 10 MG tablet swelling lips, hoarseness and wheezing asking if Dr Kirtland Bouchard can please order a different medication. Asking for Rx to be sent to [facility]  Ph# [phone]

## 2021-04-08 NOTE — Telephone Encounter (Signed)
Called pt could not leave VM. VM was full.  Will route this note to [facility] Nurse just in case the patient returns call to clinic. [facility] Nurse may give patient results if pt returns the call.  CRM has also been created for this msg for call center purposes. Please attach any notes regarding this lab to this result message and do not create a new CRM.   KP

## 2021-04-09 NOTE — Telephone Encounter (Signed)
Patient called, no answer, unable to leave VM due to mailbox is full.

## 2021-04-24 ENCOUNTER — Other Ambulatory Visit: Payer: Self-pay | Admitting: Family Medicine

## 2021-04-24 DIAGNOSIS — J3089 Other allergic rhinitis: Secondary | ICD-10-CM
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
## 2021-04-25 ENCOUNTER — Telehealth: Payer: Self-pay | Admitting: Family Medicine

## 2021-04-25 NOTE — Telephone Encounter (Signed)
Pt is calling to let dr Kirtland Bouchard know she is on fixed income and can not afford generic atrovent nasal spray and does not want alternative [facility] and  [facility] is aware. Pt states she will make due with other nasal spray

## 2021-05-10 ENCOUNTER — Other Ambulatory Visit: Payer: Self-pay | Admitting: Family Medicine

## 2021-07-16 ENCOUNTER — Other Ambulatory Visit: Payer: Self-pay

## 2021-07-16 ENCOUNTER — Ambulatory Visit (INDEPENDENT_AMBULATORY_CARE_PROVIDER_SITE_OTHER): Payer: Medicare Other | Admitting: Family Medicine

## 2021-07-16 ENCOUNTER — Encounter: Payer: Self-pay | Admitting: Family Medicine

## 2021-07-16 VITALS — BP 139/53 | HR 60

## 2021-07-16 DIAGNOSIS — G609 Hereditary and idiopathic neuropathy, unspecified: Secondary | ICD-10-CM

## 2021-07-16 DIAGNOSIS — G2581 Restless legs syndrome: Secondary | ICD-10-CM

## 2021-07-16 DIAGNOSIS — M79605 Pain in left leg: Secondary | ICD-10-CM | POA: Diagnosis not present

## 2021-07-16 NOTE — Patient Instructions (Addendum)
Thank you for coming to the office today.  For gabapentin dosing Go up to 2 pills in AM, 2 in afternoon and up to max of 6 at night, can start with only 5 then up to 6 eventually, if need can drop down to 1 in afternoon.  Please call and let me know if this is working, and if you prefer a new order rx sent to [facility] for the 600mg  pills instead, that would equal 1 in AM 1 in afternoon and 3 in the PM. Up to 4 pills per day.  Also if we need to do a short term temporary pain medicine, let me know can try to make this work as well but not a long term option.  For Constipation (less frequent bowel movement that can be hard dry or involve straining).  Recommend trying OTC Miralax 17g = 1 capful in large glass water once daily for now, try several days to see if working, goal is soft stool or BM 1-2 times daily, if too loose then reduce dose or try every other day. If not effective may need to increase it to 2 doses at once in AM or may do 1 in morning and 1 in afternoon/evening  - This medicine is very safe and can be used often without any problem and will not make you dehydrated. It is good for use on AS NEEDED BASIS or even MAINTENANCE therapy for longer term for several days to weeks at a time to help regulate bowel movements  Other more natural remedies or preventative treatment: - Increase hydration with water - Increase fiber in diet (high fiber foods = vegetables, leafy greens, oats/grains) - May take OTC Fiber supplement (metamucil powder or pill/gummy) - May try OTC Probiotic   Please schedule a Follow-up Appointment to: Return if symptoms worsen or fail to improve.  If you have any other questions or concerns, please feel free to call the office or send a message through MyChart. You may also schedule an earlier appointment if necessary.  Additionally, you may be receiving a survey about your experience at our office within a few days to 1 week by e-mail or mail. We value your  feedback.  , DO [facility], [facility]

## 2021-07-16 NOTE — Progress Notes (Signed)
Subjective:    Patient ID: Paige House, female    DOB: 16-Apr-1934, 87 y.o.   MRN: [id number]  Paige House is a 87 y.o. female presenting on 07/16/2021 for Irritable Bowel Syndrome and Constipation   HPI  Grief Reaction / Bereavement She lost her daughter 2 weeks ago. She had significant congenital heart problems among other issues. However this was a recent sudden change and loss. She is experiencing a difficulty processing this. She has some support system. Admits current depressive and anxious symptoms as an immediate reaction  Postural / Orthostatic Dizziness She gets up slowly and that helps.  RLS Peripheral Neuropathy Chronic Left leg pain Stressed out due to leg / knee pain - keeping her awake, difficulty sleeping Describes pins needles tingling in foot and lower leg at times, episodic, not constant, worse at night. Feels restless leg keeping her up and pain Never successfully took much of the Tramadol rx prior, limited relief from her report Takes gabapentin 300mg  x 2 in AM and x 2 in PM and x 4 at bedtime. - Gabapentin is only thing that helps sometimes she takes extra 1 dose at bed and it helps Taking Ropinirole 2mg  nightly without relief In past has seen Neurology, 2017 [facility] limited relief Denies any leg pain, calf pain, redness, swelling, asymmetry, constant numbness tingling or weakness  Hemorrhoids Constipation Chronic problem reports swollen or inflamed hemorrhoids that interfere and worse with constipation   Depression screen [facility] 2/9 07/16/2021 11/20/2020 06/22/2020  Decreased Interest 1 0 0  Down, Depressed, Hopeless 1 0 0  PHQ - 2 Score 2 0 0  Altered sleeping 3 - -  Tired, decreased energy 1 - -  Change in appetite 3 - -  Feeling bad or failure about yourself  1 - -  Trouble concentrating 2 - -  Moving slowly or fidgety/restless 0 - -  Suicidal thoughts 0 - -  PHQ-9 Score 12 - -  Difficult doing work/chores Extremely dIfficult - -     GAD 7 : Generalized Anxiety Score 07/16/2021 02/29/2020 10/09/2017  Nervous, Anxious, on Edge 2 0 0  Control/stop worrying 3 0 0  Worry too much - different things 3 0 0  Trouble relaxing 3 0 3  Restless 3 0 0  Easily annoyed or irritable 3 0 1  Afraid - awful might happen 3 0 0  Total GAD 7 Score 20 0 4  Anxiety Difficulty Very difficult Not difficult at all Not difficult at all     Social History   Tobacco Use   Smoking status: Former    Types: Cigarettes    Start date: 07/26/1985   Smokeless tobacco: Never  Vaping Use   Vaping Use: Never used  Substance Use Topics   Alcohol use: Not Currently    Alcohol/week: 0.0 standard drinks   Drug use: No    Review of Systems Per HPI unless specifically indicated above     Objective:    BP (!) 139/53   Pulse 60   SpO2 98%   Wt Readings from Last 3 Encounters:  01/24/21 142 lb (64.4 kg)  11/20/20 142 lb (64.4 kg)  06/22/20 149 lb 9.6 oz (67.9 kg)    Physical Exam Vitals and nursing note reviewed.  Constitutional:      General: She is not in acute distress.    Appearance: Normal appearance. She is well-developed. She is not diaphoretic.     Comments: Well-appearing, comfortable, cooperative  HENT:  Head: Normocephalic and atraumatic.  Eyes:     General:        Right eye: No discharge.        Left eye: No discharge.     Conjunctiva/sclera: Conjunctivae normal.  Cardiovascular:     Rate and Rhythm: Normal rate.  Pulmonary:     Effort: Pulmonary effort is normal.  Skin:    General: Skin is warm and dry.     Findings: No erythema or rash.  Neurological:     Mental Status: She is alert and oriented to person, place, and time.  Psychiatric:        Mood and Affect: Mood normal.        Behavior: Behavior normal.        Thought Content: Thought content normal.     Comments: Well groomed, good eye contact, normal speech and thoughts      Results for orders placed or performed in visit on 06/22/20  Hemoglobin  A1c  Result Value Ref Range   Hgb A1c MFr Bld 5.4 <5.7 % of total Hgb   Mean Plasma Glucose 108 (calc)   eAG (mmol/L) 6.0 (calc)  CBC with Differential/Platelet  Result Value Ref Range   WBC 5.2 3.8 - 10.8 Thousand/uL   RBC 4.60 3.80 - 5.10 Million/uL   Hemoglobin 13.4 11.7 - 15.5 g/dL   HCT 48.5 46.2 - 70.3 %   MCV 89.3 80.0 - 100.0 fL   MCH 29.1 27.0 - 33.0 pg   MCHC 32.6 32.0 - 36.0 g/dL   RDW 50.0 93.8 - 18.2 %   Platelets 163 140 - 400 Thousand/uL   MPV 11.3 7.5 - 12.5 fL   Neutro Abs 3,203 1,500 - 7,800 cells/uL   Lymphs Abs 1,451 850 - 3,900 cells/uL   Absolute Monocytes 348 200 - 950 cells/uL   Eosinophils Absolute 140 15 - 500 cells/uL   Basophils Absolute 57 0 - 200 cells/uL   Neutrophils Relative % 61.6 %   Total Lymphocyte 27.9 %   Monocytes Relative 6.7 %   Eosinophils Relative 2.7 %   Basophils Relative 1.1 %  COMPLETE METABOLIC PANEL WITH GFR  Result Value Ref Range   Glucose, Bld 95 65 - 99 mg/dL   BUN 20 7 - 25 mg/dL   Creat 9.93 (H) 7.16 - 0.88 mg/dL   GFR, Est Non African American 43 (L) > OR = 60 mL/min/1.17m2   GFR, Est African American 50 (L) > OR = 60 mL/min/1.50m2   BUN/Creatinine Ratio 17 6 - 22 (calc)   Sodium 140 135 - 146 mmol/L   Potassium 4.6 3.5 - 5.3 mmol/L   Chloride 104 98 - 110 mmol/L   CO2 29 20 - 32 mmol/L   Calcium 9.4 8.6 - 10.4 mg/dL   Total Protein 6.8 6.1 - 8.1 g/dL   Albumin 4.6 3.6 - 5.1 g/dL   Globulin 2.2 1.9 - 3.7 g/dL (calc)   AG Ratio 2.1 1.0 - 2.5 (calc)   Total Bilirubin 1.1 0.2 - 1.2 mg/dL   Alkaline phosphatase (APISO) 60 37 - 153 U/L   AST 22 10 - 35 U/L   ALT 14 6 - 29 U/L  Lipid panel  Result Value Ref Range   Cholesterol 187 <200 mg/dL   HDL 56 > OR = 50 mg/dL   Triglycerides 967 <893 mg/dL   LDL Cholesterol (Calc) 107 (H) mg/dL (calc)   Total CHOL/HDL Ratio 3.3 <5.0 (calc)   Non-HDL Cholesterol (Calc) 131 (H) <  130 mg/dL (calc)  TSH  Result Value Ref Range   TSH 0.61 0.40 - 4.50 mIU/L  Vitamin B12   Result Value Ref Range   Vitamin B-12 490 200 - 1,100 pg/mL  VITAMIN D 25 Hydroxy (Vit-D Deficiency, Fractures)  Result Value Ref Range   Vit D, 25-Hydroxy 27 (L) 30 - 100 ng/mL      Assessment & Plan:   Problem List Items Addressed This Visit     Restless leg syndrome - Primary   Neuropathy, peripheral   Relevant Medications   gabapentin (NEURONTIN) 300 MG capsule   Other Visit Diagnoses     Left leg pain          #RLS #Peripheral Neuropathy Chronic Leg Pain   Chronic problems, seems multifactorial - ultimately related to neuropathic pain symptoms and with RLS associated. Keeping her awake  Continue Ropinirole 2mg  Move on from Tramadol, limited due to cost of rx and did not take  Trial on dose inc Gabapentin to near max dose on 300mg  tabs x 2 in AM and x 2 in afternoon and x 4 in PM - will add x 1-2 extra in PM for mad dose of 3000mg  per day. Should NOT exceed this level, advised may need to REDUCE her afternoon dose in order to increase PM dose.  Other options would be opiate pain medication for her leg pain we discussed this is not advised or recommended and could be short term only possibly re try the tramadol or a hydrocodone if indicated, may need pain management in future   Or we can refer back to Neurology for consultation given severity of her symptoms and lack of response to therapy  Follow-up if needed      No orders of the defined types were placed in this encounter.     Follow up plan: Return if symptoms worsen or fail to improve.   , DO [facility] 07/16/2021, 2:36 PM

## 2021-07-31 ENCOUNTER — Other Ambulatory Visit: Payer: Self-pay | Admitting: Family Medicine

## 2021-07-31 DIAGNOSIS — E782 Mixed hyperlipidemia: Secondary | ICD-10-CM

## 2021-07-31 NOTE — Telephone Encounter (Signed)
Requested medications are due for refill today.  yes  Requested medications are on the active medications list.  yes  Last refill. 05/10/2021 & 02/12/2021  Future visit scheduled.   yes  Notes to clinic.  Patient is overdue for lab work.

## 2021-08-16 ENCOUNTER — Ambulatory Visit: Payer: Medicare Other | Admitting: Family Medicine

## 2021-08-26 ENCOUNTER — Other Ambulatory Visit: Payer: Self-pay

## 2021-08-26 ENCOUNTER — Ambulatory Visit (INDEPENDENT_AMBULATORY_CARE_PROVIDER_SITE_OTHER): Payer: Medicare Other | Admitting: Family Medicine

## 2021-08-26 ENCOUNTER — Encounter: Payer: Self-pay | Admitting: Family Medicine

## 2021-08-26 VITALS — BP 139/56 | HR 71 | Ht 60.0 in | Wt 149.0 lb

## 2021-08-26 DIAGNOSIS — G609 Hereditary and idiopathic neuropathy, unspecified: Secondary | ICD-10-CM | POA: Diagnosis not present

## 2021-08-26 DIAGNOSIS — N3281 Overactive bladder: Secondary | ICD-10-CM

## 2021-08-26 DIAGNOSIS — G2581 Restless legs syndrome: Secondary | ICD-10-CM

## 2021-08-26 MED ORDER — GABAPENTIN 600 MG PO TABS: 600.0000 mg | ORAL_TABLET | Freq: Three times a day (TID) | ORAL | 3 refills | Status: DC

## 2021-08-26 MED ORDER — OXYBUTYNIN CHLORIDE ER 5 MG PO TB24: 5.0000 mg | ORAL_TABLET | Freq: Every day | ORAL | 2 refills | Status: DC

## 2021-08-26 NOTE — Patient Instructions (Signed)
Thank you for coming to the office today.  Switch Gabapentin from 300 to 600mg , now take less, take 1 in AM 1 in afternoon and 3 in PM  Start on Oxybutynin for bladder take at bedtime, may cause some side effects  Keep up the great work otherwise with miralax  Please schedule a Follow-up Appointment to: Return in about 4 months (around 12/26/2021) for 4 month follow-up Neuropathy RLS, OAB.  If you have any other questions or concerns, please feel free to call the office or send a message through MyChart. You may also schedule an earlier appointment if necessary.  Additionally, you may be receiving a survey about your experience at our office within a few days to 1 week by e-mail or mail. We value your feedback.  02/23/2022, DO [facility], [facility]

## 2021-08-26 NOTE — Progress Notes (Signed)
Subjective:    Patient ID: Paige House, female    DOB: 04/12/1934, 87 y.o.   MRN: [id number]  Paige House is a 87 y.o. female presenting on 08/26/2021 for Peripheral Neuropathy   HPI  RLS Peripheral Neuropathy Chronic Left leg pain Problem with leg pain neuropathy waking her up, not bad during day if active but worse at night when asleep. Describes pins needles tingling in foot and lower leg at times, episodic, not constant, worse at night. Feels restless leg keeping her up and pain Takes gabapentin 300mg  x 2 in AM and x 2 in PM and x 4 at bedtime. - works for 5 hours then will wake up usually. Taking Ropinirole 2mg  nightly without relief In past has seen Neurology, 2017 [facility] limited relief Denies any leg pain, calf pain, redness, swelling, asymmetry, constant numbness tingling or weakness  Constipation Hemorrhoids Miralax helps control her constipation and bowel movements Admits hemorrhoids, and some mucus in stool No flare of pain or bleeding  Overactive Bladder OAB Chronic problem. In past Previously on Oxybutynin XL with some good results, interested to re try Could not start myrbetriq due to cost previously ordered, but has not been able to get this covered. Declines Urology    Depression screen [facility] 2/9 07/16/2021 11/20/2020 06/22/2020  Decreased Interest 1 0 0  Down, Depressed, Hopeless 1 0 0  PHQ - 2 Score 2 0 0  Altered sleeping 3 - -  Tired, decreased energy 1 - -  Change in appetite 3 - -  Feeling bad or failure about yourself  1 - -  Trouble concentrating 2 - -  Moving slowly or fidgety/restless 0 - -  Suicidal thoughts 0 - -  PHQ-9 Score 12 - -  Difficult doing work/chores Extremely dIfficult - -    Social History   Tobacco Use   Smoking status: Former    Types: Cigarettes    Start date: 07/26/1985   Smokeless tobacco: Never  Vaping Use   Vaping Use: Never used  Substance Use Topics   Alcohol use: Not Currently     Alcohol/week: 0.0 standard drinks   Drug use: No    Review of Systems Per HPI unless specifically indicated above     Objective:    BP (!) 139/56   Pulse 71   Ht 5' (1.524 m)   Wt 149 lb (67.6 kg)   SpO2 95%   BMI 29.10 kg/m   Wt Readings from Last 3 Encounters:  08/26/21 149 lb (67.6 kg)  01/24/21 142 lb (64.4 kg)  11/20/20 142 lb (64.4 kg)    Physical Exam Vitals and nursing note reviewed.  Constitutional:      General: She is not in acute distress.    Appearance: Normal appearance. She is well-developed. She is not diaphoretic.     Comments: Well-appearing, comfortable, cooperative  HENT:     Head: Normocephalic and atraumatic.  Eyes:     General:        Right eye: No discharge.        Left eye: No discharge.     Conjunctiva/sclera: Conjunctivae normal.  Cardiovascular:     Rate and Rhythm: Normal rate.  Pulmonary:     Effort: Pulmonary effort is normal.  Skin:    General: Skin is warm and dry.     Findings: No erythema or rash.  Neurological:     Mental Status: She is alert and oriented to person, place, and time.  Psychiatric:  Mood and Affect: Mood normal.        Behavior: Behavior normal.        Thought Content: Thought content normal.     Comments: Well groomed, good eye contact, normal speech and thoughts     Results for orders placed or performed in visit on 06/22/20  Hemoglobin A1c  Result Value Ref Range   Hgb A1c MFr Bld 5.4 <5.7 % of total Hgb   Mean Plasma Glucose 108 (calc)   eAG (mmol/L) 6.0 (calc)  CBC with Differential/Platelet  Result Value Ref Range   WBC 5.2 3.8 - 10.8 Thousand/uL   RBC 4.60 3.80 - 5.10 Million/uL   Hemoglobin 13.4 11.7 - 15.5 g/dL   HCT 35.3 29.9 - 24.2 %   MCV 89.3 80.0 - 100.0 fL   MCH 29.1 27.0 - 33.0 pg   MCHC 32.6 32.0 - 36.0 g/dL   RDW 68.3 41.9 - 62.2 %   Platelets 163 140 - 400 Thousand/uL   MPV 11.3 7.5 - 12.5 fL   Neutro Abs 3,203 1,500 - 7,800 cells/uL   Lymphs Abs 1,451 850 - 3,900  cells/uL   Absolute Monocytes 348 200 - 950 cells/uL   Eosinophils Absolute 140 15 - 500 cells/uL   Basophils Absolute 57 0 - 200 cells/uL   Neutrophils Relative % 61.6 %   Total Lymphocyte 27.9 %   Monocytes Relative 6.7 %   Eosinophils Relative 2.7 %   Basophils Relative 1.1 %  COMPLETE METABOLIC PANEL WITH GFR  Result Value Ref Range   Glucose, Bld 95 65 - 99 mg/dL   BUN 20 7 - 25 mg/dL   Creat 2.97 (H) 9.89 - 0.88 mg/dL   GFR, Est Non African American 43 (L) > OR = 60 mL/min/1.70m2   GFR, Est African American 50 (L) > OR = 60 mL/min/1.60m2   BUN/Creatinine Ratio 17 6 - 22 (calc)   Sodium 140 135 - 146 mmol/L   Potassium 4.6 3.5 - 5.3 mmol/L   Chloride 104 98 - 110 mmol/L   CO2 29 20 - 32 mmol/L   Calcium 9.4 8.6 - 10.4 mg/dL   Total Protein 6.8 6.1 - 8.1 g/dL   Albumin 4.6 3.6 - 5.1 g/dL   Globulin 2.2 1.9 - 3.7 g/dL (calc)   AG Ratio 2.1 1.0 - 2.5 (calc)   Total Bilirubin 1.1 0.2 - 1.2 mg/dL   Alkaline phosphatase (APISO) 60 37 - 153 U/L   AST 22 10 - 35 U/L   ALT 14 6 - 29 U/L  Lipid panel  Result Value Ref Range   Cholesterol 187 <200 mg/dL   HDL 56 > OR = 50 mg/dL   Triglycerides 211 <941 mg/dL   LDL Cholesterol (Calc) 107 (H) mg/dL (calc)   Total CHOL/HDL Ratio 3.3 <5.0 (calc)   Non-HDL Cholesterol (Calc) 131 (H) <130 mg/dL (calc)  TSH  Result Value Ref Range   TSH 0.61 0.40 - 4.50 mIU/L  Vitamin B12  Result Value Ref Range   Vitamin B-12 490 200 - 1,100 pg/mL  VITAMIN D 25 Hydroxy (Vit-D Deficiency, Fractures)  Result Value Ref Range   Vit D, 25-Hydroxy 27 (L) 30 - 100 ng/mL      Assessment & Plan:   Problem List Items Addressed This Visit     Restless leg syndrome   Relevant Medications   gabapentin (NEURONTIN) 600 MG tablet   OAB (overactive bladder)   Relevant Medications   oxybutynin (DITROPAN-XL) 5 MG 24 hr  tablet   Neuropathy, peripheral - Primary   Relevant Medications   gabapentin (NEURONTIN) 600 MG tablet    #RLS #Peripheral  Neuropathy Chronic Leg Pain   Chronic problems, seems multifactorial - ultimately related to neuropathic pain symptoms and with RLS associated. Keeping her awake   Continue Ropinirole 2mg  Increase dose Gabapentin from 300mg  to 600mg , reduce frequency # pills, now to 1 in AM / 1 in afternoon and 3 in PM.   Or we can refer back to Neurology for consultation given severity of her symptoms and lack of response to therapy  #OAB Restart trial Oxybutynin XL 5mg  nightly. Caution side effects. Failed myrbetriq due to cost coverage, can refer to Urology as next option  #Constipation Hemorrhoids Continue Miralax constipation regimen.  Meds ordered this encounter  Medications   gabapentin (NEURONTIN) 600 MG tablet    Sig: Take 1 tablet (600 mg total) by mouth 3 (three) times daily.    Dispense:  450 tablet    Refill:  3    Dosage change on Gabapentin from 300 to 600mg    oxybutynin (DITROPAN-XL) 5 MG 24 hr tablet    Sig: Take 1 tablet (5 mg total) by mouth at bedtime.    Dispense:  30 tablet    Refill:  2      Follow up plan: Return in about 4 months (around 12/26/2021) for 4 month follow-up Neuropathy RLS, OAB.   , DO [facility] 08/26/2021, 1:35 PM

## 2021-09-17 ENCOUNTER — Other Ambulatory Visit: Payer: Self-pay

## 2021-09-17 DIAGNOSIS — N3281 Overactive bladder: Secondary | ICD-10-CM

## 2021-09-17 MED ORDER — OXYBUTYNIN CHLORIDE ER 5 MG PO TB24
5.0000 mg | ORAL_TABLET | Freq: Every day | ORAL | 2 refills | Status: DC
Start: 2021-09-17 — End: 2021-11-18

## 2021-09-25 ENCOUNTER — Other Ambulatory Visit: Payer: Self-pay | Admitting: Family Medicine

## 2021-09-25 DIAGNOSIS — K589 Irritable bowel syndrome without diarrhea: Secondary | ICD-10-CM

## 2021-09-25 NOTE — Telephone Encounter (Signed)
Requested medications are due for refill today.  yes  Requested medications are on the active medications list.  yes  Last refill. 12/19/2020  Future visit scheduled.   yes  Notes to clinic.  Medication not delegated.

## 2021-09-25 NOTE — Telephone Encounter (Signed)
Please refill this med as soon as possible, pt called after hours and is having a really bad miserable attack and this is her help during these attacks, states that if she does not get this med she will have to go to the ER last seen 9/12  and 8/2

## 2021-10-16 ENCOUNTER — Other Ambulatory Visit: Payer: Self-pay | Admitting: Family Medicine

## 2021-10-16 DIAGNOSIS — F5081 Binge eating disorder: Secondary | ICD-10-CM

## 2021-10-16 DIAGNOSIS — R635 Abnormal weight gain: Secondary | ICD-10-CM

## 2021-10-16 DIAGNOSIS — R632 Polyphagia: Secondary | ICD-10-CM

## 2021-10-16 MED ORDER — FLUOXETINE HCL 20 MG PO CAPS: ORAL_CAPSULE | ORAL | 0 refills | Status: DC

## 2021-10-16 MED ORDER — BUPROPION HCL ER (XL) 150 MG PO TB24: 150.0000 mg | ORAL_TABLET | Freq: Every day | ORAL | 0 refills | Status: DC

## 2021-10-16 NOTE — Telephone Encounter (Signed)
Copied from CRM [id number]. Topic: Quick Communication - Rx Refill/Question >> Oct 16, 2021 11:47 AM Marylen Ponto wrote: Medication: FLUoxetine (PROZAC) 20 MG capsule and buPROPion (WELLBUTRIN XL) 150 MG 24 hr tablet  Has the patient contacted their pharmacy? Yes.   (Agent: If no, request that the patient contact the pharmacy for the refill. If patient does not wish to contact the pharmacy document the reason why and proceed with request.) (Agent: If yes, when and what did the pharmacy advise?)  Preferred Pharmacy (with phone number or street name): [facility]) [place], [place] - [address] [place] Phone: [phone]   Fax: [phone]  Has the patient been seen for an appointment in the last year OR does the patient have an upcoming appointment? Yes.    Agent: Please be advised that RX refills may take up to 3 business days. We ask that you follow-up with your pharmacy.

## 2021-10-16 NOTE — Telephone Encounter (Signed)
Requested Prescriptions  Pending Prescriptions Disp Refills  . buPROPion (WELLBUTRIN XL) 150 MG 24 hr tablet 90 tablet 0    Sig: Take 1 tablet (150 mg total) by mouth daily.     Psychiatry: Antidepressants - bupropion Passed - 10/16/2021 12:46 PM      Passed - Last BP in normal range    BP Readings from Last 1 Encounters:  08/26/21 (!) 139/56         Passed - Valid encounter within last 6 months    Recent Outpatient Visits          1 month ago Idiopathic peripheral neuropathy   [facility] [place], Netta Neat, DO   3 months ago Restless leg syndrome   [facility] Smitty Cords, DO   8 months ago Restless leg syndrome   [facility] Smitty Cords, DO   1 year ago Stage 3a chronic kidney disease   [facility] Smitty Cords, DO   1 year ago Abnormal weight gain   [facility] Althea Charon, Netta Neat, DO      Future Appointments            In 1 month [facility]            . FLUoxetine (PROZAC) 20 MG capsule 90 capsule 0    Sig: TAKE 1 CAPSULE BY MOUTH ONCE DAILY     Psychiatry:  Antidepressants - SSRI Passed - 10/16/2021 12:46 PM      Passed - Valid encounter within last 6 months    Recent Outpatient Visits          1 month ago Idiopathic peripheral neuropathy   [facility] [place], Netta Neat, DO   3 months ago Restless leg syndrome   [facility] Smitty Cords, DO   8 months ago Restless leg syndrome   [facility] Smitty Cords, DO   1 year ago Stage 3a chronic kidney disease   [facility] Smitty Cords, DO   1 year ago Abnormal weight gain   [facility] Althea Charon, Netta Neat, DO      Future Appointments            In 1 month [facility], [facility]

## 2021-10-22 ENCOUNTER — Other Ambulatory Visit: Payer: Self-pay | Admitting: Family Medicine

## 2021-10-22 DIAGNOSIS — K589 Irritable bowel syndrome without diarrhea: Secondary | ICD-10-CM

## 2021-10-22 NOTE — Telephone Encounter (Signed)
Medication Refill - Medication: diphenoxylate-atropine (LOMOTIL) 2.5-0.025 MG tablet   Has the patient contacted their pharmacy? Yes.   (Agent: If no, request that the patient contact the pharmacy for the refill. If patient does not wish to contact the pharmacy document the reason why and proceed with request.) (Agent: If yes, when and what did the pharmacy advise?)  Preferred Pharmacy (with phone number or street name):  [facility] Delivery) [place], [place] - [address] [facility]  [address] [place] [address] [place] [address]  Phone: [phone] Fax: [phone]   Has the patient been seen for an appointment in the last year OR does the patient have an upcoming appointment? Yes.    Agent: Please be advised that RX refills may take up to 3 business days. We ask that you follow-up with your pharmacy.

## 2021-10-23 NOTE — Telephone Encounter (Signed)
Pt called back, stating that she wants PCP to "send a whole new script, so I have refills when I need them"

## 2021-10-23 NOTE — Telephone Encounter (Signed)
Requested medications are due for refill today.  yes  Requested medications are on the active medications list.  yes  Last refill. 09/26/2021  Future visit scheduled.   Yes with nurse  Notes to clinic.  Medication not delegated.

## 2021-10-23 NOTE — Telephone Encounter (Signed)
This rx was sent to [facility] on 09/26/21 with 2 additional refills.  I am not aware that this med can be filled by [facility].  Please let me know what patient needs at this time. She should already have it at [facility].  diphenoxylate-atropine (LOMOTIL) 2.5-0.025 MG tablet  2 ordered        Summary: TAKE 1 OR 2 TABLETS BY MOUTH 4 TIMES DAILY AS NEEDED FOR DIARRHEA, Normal  Start: 09/26/2021 Ord/Sold: 09/26/2021 (O) Report Taking:  Long-term:  Pharmacy: [facility] - [place], [place] - [address] Med Dose History ChangeDiscontinue     Patient Sig: TAKE 1 OR 2 TABLETS BY MOUTH 4 TIMES DAILY AS NEEDED FOR DIARRHEA     Ordered on: 09/26/2021     Authorized by: Smitty Cords     Dispense: 60 tablet     Admin Instructions: TAKE 1 OR 2 TABLETS BY MOUTH 4 TIMES DAILY AS NEEDED FOR DIARRHEA     Saralyn Pilar, DO [facility] 10/23/2021, 6:41 PM

## 2021-11-11 ENCOUNTER — Telehealth: Payer: Self-pay | Admitting: Family Medicine

## 2021-11-11 NOTE — Telephone Encounter (Signed)
Left message for patient informing her that we had to cancel her 11/26/21 AWV appt due to change in [facility] schedule. Asked patient to please call the office back so we could reschedule her AWV.

## 2021-11-18 ENCOUNTER — Other Ambulatory Visit: Payer: Self-pay | Admitting: Family Medicine

## 2021-11-18 DIAGNOSIS — N3281 Overactive bladder: Secondary | ICD-10-CM

## 2021-11-18 NOTE — Telephone Encounter (Signed)
Requested Prescriptions  Pending Prescriptions Disp Refills  . oxybutynin (DITROPAN-XL) 5 MG 24 hr tablet [Pharmacy Med Name: Oxybutynin Chloride ER 5 MG Oral Tablet Extended Release 24 Hour] 90 tablet 1    Sig: TAKE 1 TABLET BY MOUTH AT  BEDTIME     Urology:  Bladder Agents Passed - 11/18/2021  5:14 AM      Passed - Valid encounter within last 12 months    Recent Outpatient Visits          2 months ago Idiopathic peripheral neuropathy   [facility] [place], Netta Neat, DO   4 months ago Restless leg syndrome   [facility] Smitty Cords, DO   9 months ago Restless leg syndrome   [facility] Smitty Cords, DO   1 year ago Stage 3a chronic kidney disease   [facility] Smitty Cords, DO   1 year ago Abnormal weight gain   [facility] Althea Charon, Netta Neat, DO      Future Appointments            In 1 week [facility], [facility]

## 2021-11-21 ENCOUNTER — Ambulatory Visit: Payer: Medicare Other

## 2021-11-26 ENCOUNTER — Ambulatory Visit: Payer: Medicare Other

## 2021-11-29 ENCOUNTER — Other Ambulatory Visit: Payer: Self-pay | Admitting: Family Medicine

## 2021-11-29 ENCOUNTER — Ambulatory Visit: Payer: Medicare Other

## 2021-11-29 DIAGNOSIS — K589 Irritable bowel syndrome without diarrhea: Secondary | ICD-10-CM

## 2021-11-30 NOTE — Telephone Encounter (Signed)
Requested medication (s) are due for refill today: yes  Requested medication (s) are on the active medication list: yes  Last refill:  09/26/21  Future visit scheduled: no  Notes to clinic:  This medication can not be delegated, please assess.  Requested Prescriptions  Pending Prescriptions Disp Refills   diphenoxylate-atropine (LOMOTIL) 2.5-0.025 MG tablet [Pharmacy Med Name: DIPHENOXYLATE-ATROPINE 2.5-0.025 MG] 60 tablet     Sig: TAKE 1 OR 2 TABLETS BY MOUTH 4 TIMES DAILY AS NEEDED FOR DIARRHEA     Not Delegated - Gastroenterology:  Antidiarrheals Failed - 11/29/2021  7:00 PM      Failed - This refill cannot be delegated      Passed - Valid encounter within last 12 months    Recent Outpatient Visits           3 months ago Idiopathic peripheral neuropathy   [facility] [place], Netta Neat, DO   4 months ago Restless leg syndrome   [facility] Smitty Cords, DO   10 months ago Restless leg syndrome   [facility] Smitty Cords, DO   1 year ago Stage 3a chronic kidney disease   [facility] [place], Netta Neat, DO   1 year ago Abnormal weight gain   [facility] [place], Netta Neat, [place]

## 2021-12-17 ENCOUNTER — Other Ambulatory Visit: Payer: Self-pay | Admitting: Family Medicine

## 2021-12-17 DIAGNOSIS — R635 Abnormal weight gain: Secondary | ICD-10-CM

## 2021-12-17 DIAGNOSIS — R632 Polyphagia: Secondary | ICD-10-CM

## 2021-12-17 DIAGNOSIS — F5081 Binge eating disorder: Secondary | ICD-10-CM

## 2021-12-18 NOTE — Telephone Encounter (Signed)
Requested Prescriptions  Pending Prescriptions Disp Refills   buPROPion (WELLBUTRIN XL) 150 MG 24 hr tablet [Pharmacy Med Name: buPROPion HCl ER (XL) 150 MG Oral Tablet Extended Release 24 Hour] 90 tablet 0    Sig: TAKE 1 TABLET BY MOUTH DAILY     Psychiatry: Antidepressants - bupropion Passed - 12/17/2021  5:11 AM      Passed - Last BP in normal range    BP Readings from Last 1 Encounters:  08/26/21 (!) 139/56         Passed - Valid encounter within last 6 months    Recent Outpatient Visits          3 months ago Idiopathic peripheral neuropathy   [place], DO   5 months ago Restless leg syndrome   [facility] Olin Hauser, DO   10 months ago Restless leg syndrome   [facility] Olin Hauser, DO   1 year ago Stage 3a chronic kidney disease   [place], DO   1 year ago Abnormal weight gain   [place], DO              FLUoxetine (PROZAC) 20 MG capsule [Pharmacy Med Name: FLUoxetine HCl 20 MG Oral Capsule] 90 capsule 0    Sig: TAKE 1 CAPSULE BY MOUTH ONCE  DAILY     Psychiatry:  Antidepressants - SSRI Passed - 12/17/2021  5:11 AM      Passed - Valid encounter within last 6 months    Recent Outpatient Visits          3 months ago Idiopathic peripheral neuropathy   [place], DO   5 months ago Restless leg syndrome   [place], DO   10 months ago Restless leg syndrome   [place], DO   1 year ago Stage 3a chronic kidney disease   [place], DO   1 year ago Abnormal weight gain   [place], Devonne Doughty, [place]

## 2022-01-24 ENCOUNTER — Ambulatory Visit: Payer: Medicare Other | Admitting: Family Medicine

## 2022-01-27 ENCOUNTER — Ambulatory Visit: Payer: Medicare Other | Admitting: Family Medicine

## 2022-01-31 ENCOUNTER — Encounter: Payer: Self-pay | Admitting: Family Medicine

## 2022-01-31 ENCOUNTER — Other Ambulatory Visit: Payer: Self-pay

## 2022-01-31 ENCOUNTER — Ambulatory Visit (INDEPENDENT_AMBULATORY_CARE_PROVIDER_SITE_OTHER): Payer: Medicare Other | Admitting: Family Medicine

## 2022-01-31 VITALS — BP 142/56 | HR 68 | Ht 60.0 in | Wt 150.2 lb

## 2022-01-31 DIAGNOSIS — S6991XA Unspecified injury of right wrist, hand and finger(s), initial encounter: Secondary | ICD-10-CM | POA: Diagnosis not present

## 2022-01-31 DIAGNOSIS — G4700 Insomnia, unspecified: Secondary | ICD-10-CM | POA: Diagnosis not present

## 2022-01-31 DIAGNOSIS — L89301 Pressure ulcer of unspecified buttock, stage 1: Secondary | ICD-10-CM | POA: Diagnosis not present

## 2022-01-31 DIAGNOSIS — M79644 Pain in right finger(s): Secondary | ICD-10-CM

## 2022-01-31 MED ORDER — SILVER SULFADIAZINE 1 % EX CREA: 1.0000 "application " | TOPICAL_CREAM | Freq: Every day | CUTANEOUS | 3 refills | Status: DC

## 2022-01-31 NOTE — Progress Notes (Signed)
Subjective:    Patient ID: Paige House, female    DOB: 08/06/1934, 88 y.o.   MRN: [id number]  Paige House is a 88 y.o. female presenting on 01/31/2022 for Hand Pain   HPI  R Middle Finger Jammed / Pain Reports accidental fall injury forward and jammed R middle finger and had   no significant bruising, but some redness of the knuckle Describes pain as only provoked when moving, but not at rest. Difficulty with extension movement Seems overall same as when it started. Denies any numbness tingling  Wellbutrin doing well for weight maintain but she has insomnia side effect.  Pressure sore Stable stage1, needs topical silvadene PRN again  Insomnia Difficulty with sleep onset problem. Lately difficulty with mind won't turn off and she used to read a lot and now causing some issues with her sleep. Never on medication Already doing some sleep hygiene   Depression screen [facility] 2/9 07/16/2021 11/20/2020 06/22/2020  Decreased Interest 1 0 0  Down, Depressed, Hopeless 1 0 0  PHQ - 2 Score 2 0 0  Altered sleeping 3 - -  Tired, decreased energy 1 - -  Change in appetite 3 - -  Feeling bad or failure about yourself  1 - -  Trouble concentrating 2 - -  Moving slowly or fidgety/restless 0 - -  Suicidal thoughts 0 - -  PHQ-9 Score 12 - -  Difficult doing work/chores Extremely dIfficult - -    Social History   Tobacco Use   Smoking status: Former    Types: Cigarettes    Start date: 07/26/1985   Smokeless tobacco: Never  Vaping Use   Vaping Use: Never used  Substance Use Topics   Alcohol use: Not Currently    Alcohol/week: 0.0 standard drinks   Drug use: No    Review of Systems Per HPI unless specifically indicated above     Objective:    BP (!) 142/56    Pulse 68    Ht 5' (1.524 m)    Wt 150 lb 3.2 oz (68.1 kg)    SpO2 (!) 63%    BMI 29.33 kg/m   Wt Readings from Last 3 Encounters:  01/31/22 150 lb 3.2 oz (68.1 kg)  08/26/21 149 lb (67.6 kg)  01/24/21 142 lb  (64.4 kg)    Physical Exam Vitals and nursing note reviewed.  Constitutional:      General: She is not in acute distress.    Appearance: Normal appearance. She is well-developed. She is not diaphoretic.     Comments: Well-appearing, comfortable, cooperative  HENT:     Head: Normocephalic and atraumatic.  Eyes:     General:        Right eye: No discharge.        Left eye: No discharge.     Conjunctiva/sclera: Conjunctivae normal.  Cardiovascular:     Rate and Rhythm: Normal rate.  Pulmonary:     Effort: Pulmonary effort is normal.  Musculoskeletal:     Comments: Right hand middle finger with good flexion and partial extension but does not go to full degree extension.  Skin:    General: Skin is warm and dry.     Findings: No erythema or rash.  Neurological:     Mental Status: She is alert and oriented to person, place, and time.  Psychiatric:        Mood and Affect: Mood normal.        Behavior: Behavior normal.  Thought Content: Thought content normal.     Comments: Well groomed, good eye contact, normal speech and thoughts    Results for orders placed or performed in visit on 06/22/20  Hemoglobin A1c  Result Value Ref Range   Hgb A1c MFr Bld 5.4 <5.7 % of total Hgb   Mean Plasma Glucose 108 (calc)   eAG (mmol/L) 6.0 (calc)  CBC with Differential/Platelet  Result Value Ref Range   WBC 5.2 3.8 - 10.8 Thousand/uL   RBC 4.60 3.80 - 5.10 Million/uL   Hemoglobin 13.4 11.7 - 15.5 g/dL   HCT 35.3 29.9 - 24.2 %   MCV 89.3 80.0 - 100.0 fL   MCH 29.1 27.0 - 33.0 pg   MCHC 32.6 32.0 - 36.0 g/dL   RDW 68.3 41.9 - 62.2 %   Platelets 163 140 - 400 Thousand/uL   MPV 11.3 7.5 - 12.5 fL   Neutro Abs 3,203 1,500 - 7,800 cells/uL   Lymphs Abs 1,451 850 - 3,900 cells/uL   Absolute Monocytes 348 200 - 950 cells/uL   Eosinophils Absolute 140 15 - 500 cells/uL   Basophils Absolute 57 0 - 200 cells/uL   Neutrophils Relative % 61.6 %   Total Lymphocyte 27.9 %   Monocytes  Relative 6.7 %   Eosinophils Relative 2.7 %   Basophils Relative 1.1 %  COMPLETE METABOLIC PANEL WITH GFR  Result Value Ref Range   Glucose, Bld 95 65 - 99 mg/dL   BUN 20 7 - 25 mg/dL   Creat 2.97 (H) 9.89 - 0.88 mg/dL   GFR, Est Non African American 43 (L) > OR = 60 mL/min/1.38m2   GFR, Est African American 50 (L) > OR = 60 mL/min/1.56m2   BUN/Creatinine Ratio 17 6 - 22 (calc)   Sodium 140 135 - 146 mmol/L   Potassium 4.6 3.5 - 5.3 mmol/L   Chloride 104 98 - 110 mmol/L   CO2 29 20 - 32 mmol/L   Calcium 9.4 8.6 - 10.4 mg/dL   Total Protein 6.8 6.1 - 8.1 g/dL   Albumin 4.6 3.6 - 5.1 g/dL   Globulin 2.2 1.9 - 3.7 g/dL (calc)   AG Ratio 2.1 1.0 - 2.5 (calc)   Total Bilirubin 1.1 0.2 - 1.2 mg/dL   Alkaline phosphatase (APISO) 60 37 - 153 U/L   AST 22 10 - 35 U/L   ALT 14 6 - 29 U/L  Lipid panel  Result Value Ref Range   Cholesterol 187 <200 mg/dL   HDL 56 > OR = 50 mg/dL   Triglycerides 211 <941 mg/dL   LDL Cholesterol (Calc) 107 (H) mg/dL (calc)   Total CHOL/HDL Ratio 3.3 <5.0 (calc)   Non-HDL Cholesterol (Calc) 131 (H) <130 mg/dL (calc)  TSH  Result Value Ref Range   TSH 0.61 0.40 - 4.50 mIU/L  Vitamin B12  Result Value Ref Range   Vitamin B-12 490 200 - 1,100 pg/mL  VITAMIN D 25 Hydroxy (Vit-D Deficiency, Fractures)  Result Value Ref Range   Vit D, 25-Hydroxy 27 (L) 30 - 100 ng/mL      Assessment & Plan:   Problem List Items Addressed This Visit   None Visit Diagnoses     Finger pain, right    -  Primary   Pressure injury of buttock, stage 1, unspecified laterality       Relevant Medications   silver sulfADIAZINE (SILVADENE) 1 % cream   Jammed finger (interphalangeal joint), right, initial encounter  Finger does not appear fractured based on exam Neurologically intact Has good ROM still flexion. Slightly reduced extension but still partially intact, likely contusion to extensor tendon. Defer X-ray Used buddy tape method in office did provide her  good support. She can continue this for 1-2 weeks or longer as needed. Offered info on finger/wrist splint to support if interested OTC  Pressure sore, stage 1 Re order Silvadene cream PRN  Insomnia Secondary to wellbutrin, switch to AM dosing as discussed, consider IR dosing in future if need   Meds ordered this encounter  Medications   silver sulfADIAZINE (SILVADENE) 1 % cream    Sig: Apply 1 application topically daily. For 7-10 days, repeat as needed.    Dispense:  50 g    Refill:  3      Follow up plan: Return if symptoms worsen or fail to improve.   Saralyn Pilar, DO [facility] 01/31/2022, 3:24 PM

## 2022-01-31 NOTE — Patient Instructions (Addendum)
Thank you for coming to the office today.  Melantonin start with 1mg  nightly 30 min before bed, can increase to 3mg  or 5mg  eventually  Switch Wellbutrin Xl from evening to AM - as insomnia is a very high risk common side effect.   Sleep Hygiene Recommendations to promote healthy sleep in all patients, especially if symptoms of insomnia are worsening. Due to the nature of sleep rhythms, if your body gets "out of rhythm", it may take some time before your sleep cycle can be "reset".  Please try to follow as many of the following tips as you can, usually there are only a few of these are the primary cause of the problem.  ?To reset your sleep rhythm, go to bed and get up at the same time every day ?Sleep only long enough to feel rested and then get out of bed ?Do not try to force yourself to sleep. If you can't sleep, get out of bed and try again later. ?Avoid naps during the day, unless excessively tired. The more sleeping during the day, then the less sleep your body needs at night.  ?Have coffee, tea, and other foods that have caffeine only in the morning ?Exercise several days a week, but not right before bed ?If you drink alcohol, prefer to have appropriate drink with one meal, but prefer to avoid alcohol in the evening, and bedtime ?If you smoke, avoid smoking, especially in the evening  ?Avoid watching TV or looking at phones, computers, or reading devices ("e-books") that give off light at least 30 minutes before bed. This artificial light sends "awake signals" to your brain and can make it harder to fall asleep. ?Make your bedroom a comfortable place where it is easy to fall asleep: Put up shades or special blackout curtains to block light from outside. Use a white noise machine to block noise. Keep the temperature cool. ?Try your best to solve or at least address your problems before you go to bed ?Use relaxation techniques to manage stress. Ask your health care provider to suggest  some techniques that may work well for you. These may include: Breathing exercises. Routines to release muscle tension. Visualizing peaceful scenes.   Please schedule a Follow-up Appointment to: Return if symptoms worsen or fail to improve.  If you have any other questions or concerns, please feel free to call the office or send a message through MyChart. You may also schedule an earlier appointment if necessary.  Additionally, you may be receiving a survey about your experience at our office within a few days to 1 week by e-mail or mail. We value your feedback.  , DO [facility], 

## 2022-02-03 ENCOUNTER — Telehealth: Payer: Self-pay | Admitting: Family Medicine
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
## 2022-02-19 ENCOUNTER — Ambulatory Visit: Payer: Medicare Other | Admitting: Family Medicine

## 2022-02-25 ENCOUNTER — Ambulatory Visit
Admission: RE | Admit: 2022-02-25 | Discharge: 2022-02-25 | Disposition: A | Discharge status: Home / Self Care | Payer: Medicare Other | Attending: Family Medicine | Admitting: Family Medicine

## 2022-02-25 ENCOUNTER — Encounter: Payer: Self-pay | Admitting: Family Medicine

## 2022-02-25 ENCOUNTER — Other Ambulatory Visit: Payer: Self-pay

## 2022-02-25 ENCOUNTER — Ambulatory Visit (INDEPENDENT_AMBULATORY_CARE_PROVIDER_SITE_OTHER): Payer: Medicare Other | Admitting: Family Medicine

## 2022-02-25 ENCOUNTER — Ambulatory Visit
Admission: RE | Admit: 2022-02-25 | Discharge: 2022-02-25 | Disposition: A | Discharge status: Home / Self Care | Payer: Medicare Other | Source: Ambulatory Visit | Attending: Family Medicine | Admitting: Family Medicine

## 2022-02-25 VITALS — BP 121/62 | HR 63 | Ht 60.0 in | Wt 149.6 lb

## 2022-02-25 DIAGNOSIS — L89301 Pressure ulcer of unspecified buttock, stage 1: Secondary | ICD-10-CM

## 2022-02-25 DIAGNOSIS — M79644 Pain in right finger(s): Secondary | ICD-10-CM

## 2022-02-25 DIAGNOSIS — L2082 Flexural eczema: Secondary | ICD-10-CM | POA: Diagnosis not present

## 2022-02-25 DIAGNOSIS — S6991XA Unspecified injury of right wrist, hand and finger(s), initial encounter: Secondary | ICD-10-CM | POA: Diagnosis not present

## 2022-02-25 MED ORDER — TRIAMCINOLONE ACETONIDE 0.5 % EX CREA: TOPICAL_CREAM | Freq: Two times a day (BID) | CUTANEOUS | 2 refills | Status: AC

## 2022-02-25 NOTE — Progress Notes (Signed)
? ?Subjective:  ? ? Patient ID: Paige House, female    DOB: 1934-10-06, 88 y.o.   MRN: [id number] ? ?Paige House is a 88 y.o. female presenting on 02/25/2022 for Hand Pain ? ? ?HPI ? ?Right Middle Finger Pain ?Last visit 01/31/22, 1 month ago for same problem, given buddy tape / finger/hand support splint recommendations. Topical nsaid. ? ?At time of injury with accidental jammed finger, no significant bruising, but some redness of the knuckle ?Describes pain as only provoked when moving, but not at rest. Difficulty with extension movement ? ?Seemed to be gradually improving. But now has persistent pain. Asking for further management ? ?Pressure Sore, Buttocks Stage 1 ?No ulceration ?In past used rx cream with relief ?Asking about this ? ? ?Depression screen [facility] 2/9 02/25/2022 07/16/2021 11/20/2020  ?Decreased Interest 1 1 0  ?Down, Depressed, Hopeless 1 1 0  ?PHQ - 2 Score 2 2 0  ?Altered sleeping 3 3 -  ?Tired, decreased energy 1 1 -  ?Change in appetite 3 3 -  ?Feeling bad or failure about yourself  1 1 -  ?Trouble concentrating 1 2 -  ?Moving slowly or fidgety/restless 0 0 -  ?Suicidal thoughts 0 0 -  ?PHQ-9 Score 11 12 -  ?Difficult doing work/chores Not difficult at all Extremely dIfficult -  ? ? ?Social History  ? ?Tobacco Use  ? Smoking status: Former  ?  Types: Cigarettes  ?  Start date: 07/26/1985  ? Smokeless tobacco: Never  ?Vaping Use  ? Vaping Use: Never used  ?Substance Use Topics  ? Alcohol use: Not Currently  ?  Alcohol/week: 0.0 standard drinks  ? Drug use: No  ? ? ?Review of Systems ?Per HPI unless specifically indicated above ? ?   ?Objective:  ?  ?BP 121/62   Pulse 63   Ht 5' (1.524 m)   Wt 149 lb 9.6 oz (67.9 kg)   SpO2 98%   BMI 29.22 kg/m?   ?Wt Readings from Last 3 Encounters:  ?02/25/22 149 lb 9.6 oz (67.9 kg)  ?01/31/22 150 lb 3.2 oz (68.1 kg)  ?08/26/21 149 lb (67.6 kg)  ?  ?Physical Exam ?Vitals and nursing note reviewed.  ?Constitutional:   ?   General: She is not in acute  distress. ?   Appearance: Normal appearance. She is well-developed. She is not diaphoretic.  ?   Comments: Well-appearing, comfortable, cooperative  ?HENT:  ?   Head: Normocephalic and atraumatic.  ?Eyes:  ?   General:     ?   Right eye: No discharge.     ?   Left eye: No discharge.  ?   Conjunctiva/sclera: Conjunctivae normal.  ?Cardiovascular:  ?   Rate and Rhythm: Normal rate.  ?Pulmonary:  ?   Effort: Pulmonary effort is normal.  ?Musculoskeletal:  ?   Comments: Right hand middle finger with good flexion and partial extension but does not go to full degree extension.  ?Skin: ?   General: Skin is warm and dry.  ?   Findings: No erythema or rash.  ?Neurological:  ?   Mental Status: She is alert and oriented to person, place, and time.  ?Psychiatric:     ?   Mood and Affect: Mood normal.     ?   Behavior: Behavior normal.     ?   Thought Content: Thought content normal.  ?   Comments: Well groomed, good eye contact, normal speech and thoughts  ? ? ?I  have personally reviewed the radiology report from 02/25/22 on R Finger Middle. ? ?CLINICAL DATA:  w a female at age 69 presents for evaluation of ?middle finger injury that occurred greater than 1 month ago by ?report. ?  ?EXAM: ?RIGHT MIDDLE FINGER 2+V ?  ?COMPARISON:  None ?  ?FINDINGS: ?Degenerative changes about the distal interphalangeal joint. No ?signs of fracture or dislocation. Perhaps mild soft tissue swelling ?about the finger. ?  ?IMPRESSION: ?Question mild soft tissue swelling with signs of degenerative ?changes in the distal interphalangeal joint. No sign of acute ?fracture or dislocation. ?  ?  ?Electronically Signed ?  By: Donzetta Kohut M.D. ?  On: 02/25/2022 15:37 ? ?   ?Assessment & Plan:  ? ?Problem List Items Addressed This Visit   ?None ?Visit Diagnoses   ? ? Finger pain, right    -  Primary  ? Relevant Orders  ? DG Finger Middle Right (Completed)  ? Ambulatory referral to Orthopedic Surgery  ? Flexural eczema      ? Relevant Medications  ?  triamcinolone cream (KENALOG) 0.5 %  ? Pressure injury of buttock, stage 1, unspecified laterality      ? ?  ?  ?R middle finger pain, persistent ?Localized to R MCP mostly dorsal and palmar, limited extension ? ?Unsuccessful with buddy tape and finger/hand splint support. ? ?Check X-ray today STAT to rule out avulsion fracture or other injury ? ?Reviewed results, called patient, degenerative changes, some soft tissue swelling, still suspect tendon injury as possible issue. ? ?She agrees for referral to Hand specialist / orthopedic, sent to [facility] ? ? ?Meds ordered this encounter  ?Medications  ? triamcinolone cream (KENALOG) 0.5 %  ?  Sig: Apply topically 2 (two) times daily. Up to 2 weeks as needed pressure sure  ?  Dispense:  45 g  ?  Refill:  2  ? ?Orders Placed This Encounter  ?Procedures  ? DG Finger Middle Right  ?  Standing Status:   Future  ?  Number of Occurrences:   1  ?  Standing Expiration Date:   02/26/2023  ?  Order Specific Question:   Reason for Exam (SYMPTOM  OR DIAGNOSIS REQUIRED)  ?  Answer:   >1 month with Right middle finger jammed injury, pain PIP and MCP joint, pain with extension  ?  Order Specific Question:   Preferred imaging location?  ?  Answer:   [facility]  ? Ambulatory referral to Orthopedic Surgery  ?  Referral Priority:   Routine  ?  Referral Type:   Surgical  ?  Referral Reason:   Specialty Services Required  ?  Requested Specialty:   Orthopedic Surgery  ?  Number of Visits Requested:   1  ? ? ? ?Follow up plan: ?Return if symptoms worsen or fail to improve. ? ? ?Saralyn Pilar, DO ?[facility] ?[facility] ?02/25/2022, 2:59 PM ?

## 2022-02-25 NOTE — Patient Instructions (Addendum)
X-ray today, stay tuned for results. ? ?Likely referral to Hand Specialist - Dr Berta Minor ? ?[facility] ?Address: [address] [place], [place], [place] [address] ?Hours:  9AM-5PM ?Phone: [phone] ? ?Please schedule a Follow-up Appointment to: Return if symptoms worsen or fail to improve. ? ?If you have any other questions or concerns, please feel free to call the office or send a message through MyChart. You may also schedule an earlier appointment if necessary. ? ?Additionally, you may be receiving a survey about your experience at our office within a few days to 1 week by e-mail or mail. We value your feedback. ? ?Saralyn Pilar, DO ?[facility], [place] ?

## 2022-03-03 ENCOUNTER — Other Ambulatory Visit: Payer: Self-pay | Admitting: Family Medicine

## 2022-03-03 DIAGNOSIS — H8111 Benign paroxysmal vertigo, right ear: Secondary | ICD-10-CM

## 2022-03-03 MED ORDER — MECLIZINE HCL 25 MG PO TABS: 25.0000 mg | ORAL_TABLET | Freq: Every day | ORAL | 1 refills | Status: DC | PRN

## 2022-03-03 NOTE — Telephone Encounter (Signed)
Requested medication (s) are due for refill today: Yes ? ?Requested medication (s) are on the active medication list:Yes ? ?Last refill:  12/17/20 ? ?Future visit scheduled: No ? ?Notes to clinic:  Unable to refill per protocol, cannot delegate. 2nd call from pharmacy ? ? ? ? ? ?Requested Prescriptions  ?Pending Prescriptions Disp Refills  ? meclizine (ANTIVERT) 25 MG tablet 90 tablet 1  ?  ? Not Delegated - Gastroenterology: Antiemetics Failed - 03/03/2022  2:06 PM  ?  ?  Failed - This refill cannot be delegated  ?  ?  Passed - Valid encounter within last 6 months  ?  Recent Outpatient Visits   ? ?      ? 6 days ago Finger pain, right  ? [facility] [place], Netta Neat, DO  ? 1 month ago Finger pain, right  ? [facility] [place], Netta Neat, DO  ? 6 months ago Idiopathic peripheral neuropathy  ? [facility] [place], Netta Neat, DO  ? 7 months ago Restless leg syndrome  ? [facility] [place], Netta Neat, DO  ? 1 year ago Restless leg syndrome  ? [facility] Smitty Cords, DO  ? ?  ?  ? ?  ?  ?  ? ? ? ? ?

## 2022-03-03 NOTE — Telephone Encounter (Signed)
Copied from CRM [id number]. Topic: Quick Communication - Rx Refill/Question ?>> Mar 03, 2022  1:49 PM Jaquita Rector A wrote: ?Medication: meclizine (ANTIVERT) 25 MG tablet  ? ?Has the patient contacted their pharmacy? Yes.  Pharmacy called  ?(Agent: If no, request that the patient contact the pharmacy for the refill. If patient does not wish to contact the pharmacy document the reason why and proceed with request.) ?(Agent: If yes, when and what did the pharmacy advise?) ? ?Preferred Pharmacy (with phone number or street name): [facility] [place], [place] - [address]  ?Phone:  [phone] ?Fax:  [phone] ? ? ? ?Has the patient been seen for an appointment in the last year OR does the patient have an upcoming appointment? Yes.   ? ?Agent: Please be advised that RX refills may take up to 3 business days. We ask that you follow-up with your pharmacy. ?

## 2022-03-19 ENCOUNTER — Telehealth: Payer: Self-pay

## 2022-03-19 NOTE — Telephone Encounter (Signed)
Copied from [place] [id number]. Topic: General - Other ?>> Mar 17, 2022 11:34 AM Paige House A wrote: ?Reason for CRM: The patient has called to request that the x ray from 02/25/22 of their right middle finger be sent to [facility] in [place]  ? ?The patient has been told that the request was previously submitted by the facility and the patient was calling to request it as well  ? ?Please contact further if needed ?

## 2022-03-20 ENCOUNTER — Other Ambulatory Visit: Payer: Self-pay | Admitting: Family Medicine

## 2022-03-20 DIAGNOSIS — F5081 Binge eating disorder: Secondary | ICD-10-CM

## 2022-03-20 DIAGNOSIS — R632 Polyphagia: Secondary | ICD-10-CM

## 2022-03-20 DIAGNOSIS — R635 Abnormal weight gain: Secondary | ICD-10-CM

## 2022-03-20 NOTE — Telephone Encounter (Signed)
Requested medication (s) are due for refill today - yes ? ?Requested medication (s) are on the active medication list -yes ? ?Future visit scheduled -no ? ?Last refill: 12/18/21 #90 ? ?Notes to clinic: Request RF: fails lab protocol ? ?Requested Prescriptions  ?Pending Prescriptions Disp Refills  ? buPROPion (WELLBUTRIN XL) 150 MG 24 hr tablet [Pharmacy Med Name: buPROPion HCl ER (XL) 150 MG Oral Tablet Extended Release 24 Hour] 90 tablet 3  ?  Sig: TAKE 1 TABLET BY MOUTH DAILY  ?  ? Psychiatry: Antidepressants - bupropion Failed - 03/20/2022  7:13 AM  ?  ?  Failed - Cr in normal range and within 360 days  ?  Creat  ?Date Value Ref Range Status  ?06/22/2020 1.17 (H) 0.60 - 0.88 mg/dL Final  ?  Comment:  ?  For patients >28 years of age, the reference limit ?for Creatinine is approximately 13% higher for people ?identified as African-American. ?. ?  ?  ?  ?  ?  Failed - AST in normal range and within 360 days  ?  AST  ?Date Value Ref Range Status  ?06/22/2020 22 10 - 35 U/L Final  ? ?SGOT(AST)  ?Date Value Ref Range Status  ?04/19/2012 49 (H) 15 - 37 Unit/L Final  ?  ?  ?  ?  Failed - ALT in normal range and within 360 days  ?  ALT  ?Date Value Ref Range Status  ?06/22/2020 14 6 - 29 U/L Final  ? ?SGPT (ALT)  ?Date Value Ref Range Status  ?04/19/2012 46 U/L Final  ?  Comment:  ?  12-78 ?NOTE: NEW REFERENCE RANGE ?11/07/2011 ?  ?  ?  ?  ?  Passed - Last BP in normal range  ?  BP Readings from Last 1 Encounters:  ?02/25/22 121/62  ?  ?  ?  ?  Passed - Valid encounter within last 6 months  ?  Recent Outpatient Visits   ? ?      ? 3 weeks ago Finger pain, right  ? [facility] [place], Netta Neat, DO  ? 1 month ago Finger pain, right  ? [facility] [place], Netta Neat, DO  ? 6 months ago Idiopathic peripheral neuropathy  ? [facility] [place], Netta Neat, DO  ? 8 months ago Restless leg syndrome  ? [facility] [place], Netta Neat, DO  ? 1 year  ago Restless leg syndrome  ? [facility] Smitty Cords, DO  ? ?  ?  ? ?  ?  ?  ?Signed Prescriptions Disp Refills  ? FLUoxetine (PROZAC) 20 MG capsule 90 capsule 0  ?  Sig: TAKE 1 CAPSULE BY MOUTH ONCE  DAILY  ?  ? Psychiatry:  Antidepressants - SSRI Passed - 03/20/2022  7:13 AM  ?  ?  Passed - Valid encounter within last 6 months  ?  Recent Outpatient Visits   ? ?      ? 3 weeks ago Finger pain, right  ? [facility] [place], Netta Neat, DO  ? 1 month ago Finger pain, right  ? [facility] [place], Netta Neat, DO  ? 6 months ago Idiopathic peripheral neuropathy  ? [facility] [place], Netta Neat, DO  ? 8 months ago Restless leg syndrome  ? [facility] [place], Netta Neat, DO  ? 1 year ago Restless leg syndrome  ? [facility] Smitty Cords, DO  ? ?  ?  ? ?  ?  ?  ? ? ? ?Requested Prescriptions  ?Pending Prescriptions Disp Refills  ? buPROPion (WELLBUTRIN XL) 150 MG 24 hr tablet [Pharmacy Med Name: buPROPion HCl ER (XL) 150 MG Oral Tablet Extended Release 24 Hour] 90 tablet 3  ?  Sig: TAKE 1 TABLET BY MOUTH DAILY  ?  ? Psychiatry: Antidepressants - bupropion Failed - 03/20/2022  7:13 AM  ?  ?  Failed - Cr in normal range and within 360 days  ?  Creat  ?Date Value Ref Range Status  ?06/22/2020 1.17 (H) 0.60 - 0.88 mg/dL Final  ?  Comment:  ?  For patients >48 years of age, the reference limit ?for Creatinine is approximately 13% higher for people ?identified as African-American. ?. ?  ?  ?  ?  ?  Failed - AST in normal range and within 360 days  ?  AST  ?Date Value Ref Range Status  ?06/22/2020 22 10 - 35 U/L Final  ? ?SGOT(AST)  ?Date Value Ref Range Status  ?04/19/2012 49 (H) 15 - 37 Unit/L Final  ?  ?  ?  ?  Failed - ALT in normal range and within 360 days  ?  ALT  ?Date Value Ref Range Status  ?06/22/2020 14 6 - 29 U/L Final  ? ?SGPT (ALT)  ?Date Value Ref Range Status  ?04/19/2012 46  U/L Final  ?  Comment:  ?  12-78 ?NOTE: NEW REFERENCE RANGE ?11/07/2011 ?  ?  ?  ?  ?  Passed - Last BP in normal range  ?  BP Readings from Last 1 Encounters:  ?02/25/22 121/62  ?  ?  ?  ?  Passed - Valid encounter within last 6 months  ?  Recent Outpatient Visits   ? ?      ? 3 weeks ago Finger pain, right  ? [facility] [place], Netta Neat, DO  ? 1 month ago Finger pain, right  ? [facility] [place], Netta Neat, DO  ? 6 months ago Idiopathic peripheral neuropathy  ? [facility] [place], Netta Neat, DO  ? 8 months ago Restless leg syndrome  ? [facility] [place], Netta Neat, DO  ? 1 year ago Restless leg syndrome  ? [facility] Smitty Cords, DO  ? ?  ?  ? ?  ?  ?  ?Signed Prescriptions Disp Refills  ? FLUoxetine (PROZAC) 20 MG capsule 90 capsule 0  ?  Sig: TAKE 1 CAPSULE BY MOUTH ONCE  DAILY  ?  ? Psychiatry:  Antidepressants - SSRI Passed - 03/20/2022  7:13 AM  ?  ?  Passed - Valid encounter within last 6 months  ?  Recent Outpatient Visits   ? ?      ? 3 weeks ago Finger pain, right  ? [facility] [place], Netta Neat, DO  ? 1 month ago Finger pain, right  ? [facility] [place], Netta Neat, DO  ? 6 months ago Idiopathic peripheral neuropathy  ? [facility] [place], Netta Neat, DO  ? 8 months ago Restless leg syndrome  ? [facility] [place], Netta Neat, DO  ? 1 year ago Restless leg syndrome  ? [facility] Smitty Cords, DO  ? ?  ?  ? ?  ?  ?  ? ? ? ?

## 2022-03-20 NOTE — Telephone Encounter (Signed)
Requested Prescriptions  ?Pending Prescriptions Disp Refills  ?? FLUoxetine (PROZAC) 20 MG capsule [Pharmacy Med Name: FLUoxetine HCl 20 MG Oral Capsule] 90 capsule 3  ?  Sig: TAKE 1 CAPSULE BY MOUTH ONCE  DAILY  ?  ? Psychiatry:  Antidepressants - SSRI Passed - 03/20/2022  7:13 AM  ?  ?  Passed - Valid encounter within last 6 months  ?  Recent Outpatient Visits   ?      ? 3 weeks ago Finger pain, right  ? [facility] [place], Netta Neat, DO  ? 1 month ago Finger pain, right  ? [facility] [place], Netta Neat, DO  ? 6 months ago Idiopathic peripheral neuropathy  ? [facility] [place], Netta Neat, DO  ? 8 months ago Restless leg syndrome  ? [facility] [place], Netta Neat, DO  ? 1 year ago Restless leg syndrome  ? [facility] Smitty Cords, DO  ?  ?  ? ?  ?  ?  ?? buPROPion (WELLBUTRIN XL) 150 MG 24 hr tablet [Pharmacy Med Name: buPROPion HCl ER (XL) 150 MG Oral Tablet Extended Release 24 Hour] 90 tablet 3  ?  Sig: TAKE 1 TABLET BY MOUTH DAILY  ?  ? Psychiatry: Antidepressants - bupropion Failed - 03/20/2022  7:13 AM  ?  ?  Failed - Cr in normal range and within 360 days  ?  Creat  ?Date Value Ref Range Status  ?06/22/2020 1.17 (H) 0.60 - 0.88 mg/dL Final  ?  Comment:  ?  For patients >78 years of age, the reference limit ?for Creatinine is approximately 13% higher for people ?identified as African-American. ?. ?  ?   ?  ?  Failed - AST in normal range and within 360 days  ?  AST  ?Date Value Ref Range Status  ?06/22/2020 22 10 - 35 U/L Final  ? ?SGOT(AST)  ?Date Value Ref Range Status  ?04/19/2012 49 (H) 15 - 37 Unit/L Final  ?   ?  ?  Failed - ALT in normal range and within 360 days  ?  ALT  ?Date Value Ref Range Status  ?06/22/2020 14 6 - 29 U/L Final  ? ?SGPT (ALT)  ?Date Value Ref Range Status  ?04/19/2012 46 U/L Final  ?  Comment:  ?  12-78 ?NOTE: NEW REFERENCE RANGE ?11/07/2011 ?  ?   ?  ?  Passed - Last  BP in normal range  ?  BP Readings from Last 1 Encounters:  ?02/25/22 121/62  ?   ?  ?  Passed - Valid encounter within last 6 months  ?  Recent Outpatient Visits   ?      ? 3 weeks ago Finger pain, right  ? [facility], Netta Neat, DO  ? 1 month ago Finger pain, right  ? [facility] [place], Netta Neat, DO  ? 6 months ago Idiopathic peripheral neuropathy  ? [facility] [place], Netta Neat, DO  ? 8 months ago Restless leg syndrome  ? [facility] [place], Netta Neat, DO  ? 1 year ago Restless leg syndrome  ? [facility] Smitty Cords, DO  ?  ?  ? ?  ?  ?  ? ?

## 2022-03-26 DIAGNOSIS — S62614A Displaced fracture of proximal phalanx of right ring finger, initial encounter for closed fracture: Secondary | ICD-10-CM | POA: Diagnosis not present

## 2022-03-26 DIAGNOSIS — S62629A Displaced fracture of medial phalanx of unspecified finger, initial encounter for closed fracture: Secondary | ICD-10-CM | POA: Diagnosis not present

## 2022-04-17 ENCOUNTER — Telehealth: Payer: Self-pay

## 2022-04-17 DIAGNOSIS — L89301 Pressure ulcer of unspecified buttock, stage 1: Secondary | ICD-10-CM

## 2022-04-17 MED ORDER — MUPIROCIN 2 % EX OINT: 1.0000 "application " | TOPICAL_OINTMENT | Freq: Two times a day (BID) | CUTANEOUS | 0 refills | Status: DC

## 2022-04-17 NOTE — Telephone Encounter (Signed)
Copied from CRM [id number]. Topic: General - Other ?>> Apr 17, 2022  9:28 AM Marylen Ponto wrote: ?Reason for CRM: Pt stated the triamcinolone cream (KENALOG) 0.5 % is not working. Pt stated it is actually getting bigger and it now has a scab. Pt requests that a new Rx  for a different medication be sent to [facility]. ?

## 2022-04-17 NOTE — Telephone Encounter (Signed)
I can order a rx antibiotic ointment Mupirocin. ? ?Otherwise we have already tried to order the Silvadene cream. ? ?The rest of the topicals are OTC and not rx based. She should be using barrier cream ? ?If persistent worsening - I would suggest wound care referral instead. ? ?Thanks ? ?Nobie Putnam, DO ?[facility] ?[facility] ?04/17/2022, 3:28 PM ? ?

## 2022-04-19 ENCOUNTER — Other Ambulatory Visit: Payer: Self-pay | Admitting: Family Medicine

## 2022-04-19 DIAGNOSIS — N3281 Overactive bladder: Secondary | ICD-10-CM

## 2022-04-21 ENCOUNTER — Other Ambulatory Visit: Payer: Self-pay | Admitting: Family Medicine

## 2022-04-21 DIAGNOSIS — L89301 Pressure ulcer of unspecified buttock, stage 1: Secondary | ICD-10-CM

## 2022-04-21 NOTE — Telephone Encounter (Signed)
mupirocin ointment (BACTROBAN) 2 % 30 g 0 04/17/2022    ?Sig - Route: Apply 1 application. topically 2 (two) times daily. - Topical   ?Sent to pharmacy as: mupirocin ointment (BACTROBAN) 2 %   ? ?[facility] ([facility] ) - [place], [place] - [phone]  ?[address] [place]  [address]  ?Phone: [phone] Fax: [phone]  ?Hours: Not open 24 hours  ?Pls note that pharmacy called and states med not available at manufacturing level pls request med similar to above. Ref # [id number] Bonita Quin) ?

## 2022-04-21 NOTE — Telephone Encounter (Signed)
Requested Prescriptions  ?Pending Prescriptions Disp Refills  ?? oxybutynin (DITROPAN-XL) 5 MG 24 hr tablet [Pharmacy Med Name: Oxybutynin Chloride ER 5 MG Oral Tablet Extended Release 24 Hour] 90 tablet 0  ?  Sig: TAKE 1 TABLET BY MOUTH AT  BEDTIME  ?  ? Urology:  Bladder Agents Passed - 04/19/2022  6:21 AM  ?  ?  Passed - Valid encounter within last 12 months  ?  Recent Outpatient Visits   ?      ? 1 month ago Finger pain, right  ? [facility] [place], Netta Neat, DO  ? 2 months ago Finger pain, right  ? [facility] [place], Netta Neat, DO  ? 7 months ago Idiopathic peripheral neuropathy  ? [facility] [place], Netta Neat, DO  ? 9 months ago Restless leg syndrome  ? [facility] [place], Netta Neat, DO  ? 1 year ago Restless leg syndrome  ? [facility] Smitty Cords, DO  ?  ?  ? ?  ?  ?  ? ? ?

## 2022-04-22 ENCOUNTER — Telehealth: Payer: Self-pay | Admitting: Family Medicine

## 2022-04-22 ENCOUNTER — Other Ambulatory Visit: Payer: Self-pay | Admitting: Family Medicine

## 2022-04-22 DIAGNOSIS — L89301 Pressure ulcer of unspecified buttock, stage 1: Secondary | ICD-10-CM

## 2022-04-22 NOTE — Telephone Encounter (Signed)
Pharmacy is out of stock and request alternative Rx for Bactroban 2% ?

## 2022-04-22 NOTE — Telephone Encounter (Signed)
Can you ask patient if she would be okay to pick up rx from other pharmacy? If out of stock at [place]. ? ?There is not a great rx alternative. ? ?I would suggest ordering it to local pharmacy [facility] or other Retail. ? ?Let me know I can re-send it. ? ?Saralyn Pilar, DO ?[facility] ?[facility] ?04/22/2022, 11:59 AM ? ?

## 2022-04-22 NOTE — Telephone Encounter (Signed)
Pharmacy called to see if they can send pt an alternative RX since mupirocin ointment (BACTROBAN) 2 % is out of stock  / [facility] cb# [phone] /ref# [id number]/ please advise ?

## 2022-04-22 NOTE — Telephone Encounter (Signed)
Refilled 04/17/2022 30 0 refills ?Requested Prescriptions  ?Pending Prescriptions Disp Refills  ?? mupirocin ointment (BACTROBAN) 2 % 30 g 0  ?  Sig: Apply 1 application. topically 2 (two) times daily.  ?  ? Off-Protocol Failed - 04/21/2022  5:55 PM  ?  ?  Failed - Medication not assigned to a protocol, review manually.  ?  ?  Passed - Valid encounter within last 12 months  ?  Recent Outpatient Visits   ?      ? 1 month ago Finger pain, right  ? [place], DO  ? 2 months ago Finger pain, right  ? [place], DO  ? 7 months ago Idiopathic peripheral neuropathy  ? [place], DO  ? 9 months ago Restless leg syndrome  ? [place], DO  ? 1 year ago Restless leg syndrome  ? [place], DO  ?  ?  ? ?  ?  ?  ? ?

## 2022-04-23 ENCOUNTER — Telehealth: Payer: Self-pay | Admitting: Family Medicine

## 2022-04-23 ENCOUNTER — Other Ambulatory Visit: Payer: Self-pay | Admitting: Family Medicine

## 2022-04-23 DIAGNOSIS — L89301 Pressure ulcer of unspecified buttock, stage 1: Secondary | ICD-10-CM

## 2022-04-23 MED ORDER — MUPIROCIN 2 % EX OINT: 1.0000 "application " | TOPICAL_OINTMENT | Freq: Two times a day (BID) | CUTANEOUS | 3 refills | Status: DC | PRN

## 2022-04-23 MED ORDER — MUPIROCIN CALCIUM 2 % EX CREA: 1.0000 "application " | TOPICAL_CREAM | Freq: Two times a day (BID) | CUTANEOUS | 0 refills | Status: DC

## 2022-04-23 NOTE — Telephone Encounter (Signed)
Copied from CRM [id number]. Topic: General - Other ?>> Apr 23, 2022 11:58 AM Jaquita Rector A wrote: ?Reason for CRM: Jes with [facility] called in to inform Dr Kirtland Bouchard that they do not have the mupirocin ointment (BACTROBAN) 2 % in stock and all that they have is the cream which is much more expensive asking for a call back from dr K please. Ph# [phone] order # [id number] ?

## 2022-04-23 NOTE — Telephone Encounter (Signed)
Requested medication (s) are due for refill today:   Yes ? ?Requested medication (s) are on the active medication list:   Yes ? ?Future visit scheduled:   No ? ? ?Last ordered: 08/01/2021 #90, 3 refills ? ?Returned because labs are over due per protocol.    ? ?Requested Prescriptions  ?Pending Prescriptions Disp Refills  ? lisinopril (ZESTRIL) 5 MG tablet [Pharmacy Med Name: Lisinopril 5 MG Oral Tablet] 100 tablet 2  ?  Sig: TAKE 1 TABLET BY MOUTH  DAILY  ?  ? Cardiovascular:  ACE Inhibitors Failed - 04/22/2022 10:11 PM  ?  ?  Failed - Cr in normal range and within 180 days  ?  Creat  ?Date Value Ref Range Status  ?06/22/2020 1.17 (H) 0.60 - 0.88 mg/dL Final  ?  Comment:  ?  For patients >69 years of age, the reference limit ?for Creatinine is approximately 13% higher for people ?identified as African-American. ?. ?  ?  ?  ?  ?  Failed - K in normal range and within 180 days  ?  Potassium  ?Date Value Ref Range Status  ?06/22/2020 4.6 3.5 - 5.3 mmol/L Final  ?04/19/2012 3.9 3.5 - 5.1 mmol/L Final  ?  ?  ?  ?  Passed - Patient is not pregnant  ?  ?  Passed - Last BP in normal range  ?  BP Readings from Last 1 Encounters:  ?02/25/22 121/62  ?  ?  ?  ?  Passed - Valid encounter within last 6 months  ?  Recent Outpatient Visits   ? ?      ? 1 month ago Finger pain, right  ? [facility] [place], Netta Neat, DO  ? 2 months ago Finger pain, right  ? [facility] [place], Netta Neat, DO  ? 8 months ago Idiopathic peripheral neuropathy  ? [facility] [place], Netta Neat, DO  ? 9 months ago Restless leg syndrome  ? [facility] [place], Netta Neat, DO  ? 1 year ago Restless leg syndrome  ? [facility] Smitty Cords, DO  ? ?  ?  ? ? ?  ?  ?  ? ?

## 2022-04-23 NOTE — Telephone Encounter (Signed)
I asked pt if she wanted Rx sent to a local pharmacy and pt stated she did not / she states she is on a fixed income and would like an alternative sent to [facility] asap and they will expedite it to her / please advise  ?

## 2022-04-23 NOTE — Telephone Encounter (Signed)
I called [facility] pharmacist and spoke to them for a long time during lunch. ? ?They did not help me find an alternative. ? ?I am out of suggestions. If they have an option that they can suggest that is fine. ? ?My only other suggestion is for me to order the Mupirocin ointment she is looking for at a local pharmacy that has it in stock. ? ?Nobie Putnam, DO ?[facility] ?[facility] ?04/23/2022, 4:25 PM ? ? ?

## 2022-04-23 NOTE — Telephone Encounter (Signed)
Okay I called [facility] and worked with pharmacist, they will be processing and shipping alternative option, Mupirocin cream ? ?Saralyn Pilar, DO ?[facility] ?[facility] ?04/23/2022, 12:03 PM ? ?

## 2022-04-23 NOTE — Telephone Encounter (Signed)
Ordered Mupirocin 0.2% ointment to [employer], patient was notified and agrees to [employer] ? ?Saralyn Pilar, DO ?[facility] ?[facility] ?04/23/2022, 4:49 PM ? ?

## 2022-05-02 ENCOUNTER — Ambulatory Visit: Payer: Self-pay | Admitting: *Deleted

## 2022-05-02 DIAGNOSIS — L89301 Pressure ulcer of unspecified buttock, stage 1: Secondary | ICD-10-CM

## 2022-05-02 NOTE — Telephone Encounter (Signed)
This has been an ongoing issue for her that has not been solved with multiple attempts at topical therapy.  She would need an office visit to evaluate the wound in question.  I am not aware of any medication that she can put directly on. It would need to heal before can put topicals on.  The only one I can think of to use would be the "Silvadene Cream" we have ordered for her before in the past already.  If signs of infection would consider antibiotic or can use to prevent infection.  At this time, my best advice is referral to [facility] for consultation by their doctors to help treat the pressure sore and allow it to heal.  They could arrange dressing changes or nursing care for it too.  There is not much else I can do. I have talked to her before, and I understand her concerns, but it is not as simple as order a medicine to heal the wound.  Saralyn Pilar, DO [facility] 05/02/2022, 1:30 PM

## 2022-05-02 NOTE — Telephone Encounter (Signed)
Summary: sores on her buttocks/opened wound   Pt stated Pt stated PCP had treated her for sores on her buttocks with the medication mupirocin ointment (BACTROBAN) 2 %   But cannot use it. It's an open wound now. Pt stated medication says not use on an open wound .      Pt seeking clinical advice.     Attempted to call patient- left message to call office

## 2022-05-02 NOTE — Telephone Encounter (Signed)
  Chief Complaint: requesting new medication open wound Symptoms: buttocks scab treated with bacitracin since 04/23/22. Wound now open. Pharmacy recommended to call PCP bacitracin not to be used on open wound. Severe pain to buttocks area now difficulty when sleeping laying on back.  Frequency: na  Pertinent Negatives: Patient denies fever drainage from wound  Disposition: [] ED /[x] Urgent Care (no appt availability in office) / [] Appointment(In office/virtual)/ []  [facility]/ [] Home Care/ [] Refused Recommended Disposition /[]  Mobile Bus/ [x]  Follow-up with PCP Additional Notes:   No available appt today .patient does not want to go to [facility] or ED.  Patient requesting a new medication to treat open wound denies drainage. Covered with gauze. Would like medication sent to [facility] [address]  Patient requesting a call back today please     Reason for Disposition  [1] Using antibiotic ointment > 1 week AND [2] sore not completely healed  Answer Assessment - Initial Assessment Questions 1. APPEARANCE of SORES: "What do the sores look like?"     Open wound to buttocks  2. NUMBER: "How many sores are there?"     1 3. SIZE: "How big is the largest sore?"     na 4. LOCATION: "Where are the sores located?"     buttocks 5. ONSET: "When did the sores begin?"     na 6. CAUSE: "What do you think is causing the sores?"     ba 7. OTHER SYMPTOMS: "Do you have any other symptoms?" (e.g., fever, new weakness)     Severe pain with movement and laying on back. Requesting new medication to treat wound  Protocols used: [facility]

## 2022-05-05 NOTE — Telephone Encounter (Signed)
[facility] [address] [place],  [place]  [address] Main: [phone]  Referral sent to Wound Center  Also I will refer her to the Care Guides for Transportation assistance.  Maybe they can take her sooner.  There is no other rx I have for an open wound unfortunately.  She could purchase OTC Tegaderm Non Adherent Pad dressing and apply this to protect the skin and allow it to heal.  Saralyn Pilar, DO [facility] 05/05/2022, 12:03 PM

## 2022-05-05 NOTE — Telephone Encounter (Signed)
Patient provided information per the notes of Dr. Althea Charon on 05/02/22. Patient verbalized understanding. Patient reports that there is a pressure sore on her buttocks that had a scab on it, but a previous prescription that was being used on it has dissolved the scab and now there is an open sore. Patient states that she has no transportation to get to an in-office appt or wound care center appt. Patient states that the individual that usually provides transportation for her will not be available for 6-8 weeks due to back issues.Patient states that her son does not have a car. Patient would like to know the location of [facility] so that she could arrange transportation when available. Patient states that if there is a script that could be called in she would like for it to be sent to  [facility] because they are able to deliver.   Please return call to [phone] to provide patient with an update on recommendations due to transportation concerns.

## 2022-05-05 NOTE — Addendum Note (Signed)
Addended by: Olin Hauser on: 05/05/2022 12:03 PM   Modules accepted: Orders

## 2022-05-06 ENCOUNTER — Telehealth: Payer: Self-pay

## 2022-05-06 NOTE — Telephone Encounter (Signed)
   Telephone encounter was:  Successful.  05/06/2022 Name: Paige House MRN: [id number] DOB: [birth year]  Paige House is a 88 y.o. year old female who is a primary care patient of Smitty Cords, DO . The community resource team was consulted for assistance with Transportation Needs   Care guide performed the following interventions: Spoke with patient about [employer] [phone].  Informed patient that she would need to call at least 3 days in advance of appointments.  Follow Up Plan:  Care guide will follow up with patient by phone over the next 7 days  Paige House, AAS Paralegal, [facility] Care Guide  Embedded Care Coordination [facility]  Care Management  [address] [place], [place] [address] ??[name]  ?? [phone]   [email or web address]

## 2022-05-09 ENCOUNTER — Telehealth: Payer: Self-pay

## 2022-05-09 NOTE — Telephone Encounter (Signed)
   Telephone encounter was:  Successful.  05/09/2022 Name: Paige House MRN: [id number] DOB: 1934-04-01  Paige House is a 88 y.o. year old female who is a primary care patient of Olin Hauser, DO . The community resource team was consulted for assistance with Transportation Needs   Care guide performed the following interventions: Spoke with patient she changed her insurance coverage and no longer has a transportation benefit.  Gave her information for [employer] transportation [phone]. Spoke with [employer], right now there is no charge and she will need to call at least a week in advance of appointments.    Follow Up Plan:  No further follow up planned at this time. The patient has been provided with needed resources.  Jataya Wann, AAS Paralegal, [facility]  [address] [place], [place] [address] ??[email or web address]  ?? [email or web address]   [email or web address]

## 2022-05-13 ENCOUNTER — Telehealth: Payer: Self-pay

## 2022-05-13 DIAGNOSIS — K644 Residual hemorrhoidal skin tags: Secondary | ICD-10-CM

## 2022-05-13 MED ORDER — HYDROCORTISONE (PERIANAL) 2.5 % EX CREA: 1.0000 "application " | TOPICAL_CREAM | Freq: Two times a day (BID) | CUTANEOUS | 0 refills | Status: DC

## 2022-05-13 NOTE — Telephone Encounter (Signed)
Copied from CRM [id number]. Topic: General - Other >> May 13, 2022 12:23 PM Traci Sermon wrote: Reason for CRM: Pt called in stating she has Hemorids, and one of them has mucus in it, pt states PCP is aware of this situation, and this is something she is use to, but due to her having to go to the wound care soon, pt wants to see about getting something to help minimize that situation so she doesn't feel so embarrassed when going to wound care on 06/12. Please advise.

## 2022-05-13 NOTE — Telephone Encounter (Signed)
Please let her know that I have attempted to send in a generic rx of topical Hemorrhoid cream to use to help treat it.  Similar to the skin sore issue, most topicals for hemorrhoids are available OTC and therefore, not always covered as a rx generic version.  There is not an excellent med option for this, most of these topicals just reduce inflammation of a swollen hemorrhoid.  The only true way to treat a hemorrhoid eventually is to have a surgical procedure to remove it. That would be a general surgery consult.  Try the new topical and follow up with [facility] next  Saralyn Pilar, DO [facility] 05/13/2022, 1:07 PM

## 2022-05-26 ENCOUNTER — Ambulatory Visit: Payer: Medicare Other | Admitting: Physician Assistant

## 2022-06-20 ENCOUNTER — Encounter: Payer: Self-pay | Admitting: Family Medicine

## 2022-06-20 ENCOUNTER — Other Ambulatory Visit: Payer: Self-pay | Admitting: Family Medicine

## 2022-06-20 ENCOUNTER — Ambulatory Visit (INDEPENDENT_AMBULATORY_CARE_PROVIDER_SITE_OTHER): Payer: Medicare Other | Admitting: Family Medicine

## 2022-06-20 VITALS — BP 127/58 | HR 62 | Ht 60.0 in | Wt 149.0 lb

## 2022-06-20 DIAGNOSIS — F5081 Binge eating disorder: Secondary | ICD-10-CM

## 2022-06-20 DIAGNOSIS — R6889 Other general symptoms and signs: Secondary | ICD-10-CM

## 2022-06-20 DIAGNOSIS — G4700 Insomnia, unspecified: Secondary | ICD-10-CM

## 2022-06-20 DIAGNOSIS — F419 Anxiety disorder, unspecified: Secondary | ICD-10-CM | POA: Diagnosis not present

## 2022-06-20 MED ORDER — LORAZEPAM 0.5 MG PO TABS: 0.5000 mg | ORAL_TABLET | Freq: Two times a day (BID) | ORAL | 2 refills | Status: DC | PRN

## 2022-06-20 MED ORDER — MIRTAZAPINE 7.5 MG PO TABS: 7.5000 mg | ORAL_TABLET | Freq: Every day | ORAL | 1 refills | Status: DC

## 2022-06-20 NOTE — Telephone Encounter (Signed)
Requested medications are due for refill today.  yes  Requested medications are on the active medications list.  yes  Last refill. 03/20/2022 #90 0 refills  Future visit scheduled.   Yes - today  Notes to clinic.  Pt is requesting a 1 year supply    Requested Prescriptions  Pending Prescriptions Disp Refills   FLUoxetine (PROZAC) 20 MG capsule [Pharmacy Med Name: FLUoxetine HCl 20 MG Oral Capsule] 90 capsule 3    Sig: TAKE 1 CAPSULE BY MOUTH ONCE  DAILY     Psychiatry:  Antidepressants - SSRI Passed - 06/20/2022  5:44 AM      Passed - Valid encounter within last 6 months    Recent Outpatient Visits           3 months ago Finger pain, right   [facility] [place], Netta Neat, DO   4 months ago Finger pain, right   [facility] Smitty Cords, DO   9 months ago Idiopathic peripheral neuropathy   [facility] Smitty Cords, DO   11 months ago Restless leg syndrome   [facility] Smitty Cords, DO   1 year ago Restless leg syndrome   [facility] [place], Netta Neat, DO       Future Appointments             Today Althea Charon Netta Neat, DO [facility]

## 2022-06-20 NOTE — Patient Instructions (Addendum)
Thank you for coming to the office today.  Weekly pill caddy / box instructions  Recommend a 3 bottle system  Bottle = 90 day count - original bottle from pharmacy Bottle #2 is the weekly pill bottle - you take a 1 week supply of that one medicine from Bottle #1 and put it into Bottle #2 Weekly Pill Box - at the start of the week you take the pills from Bottle #2 and put them into the Weekly Pill Box and that way you can see that the Bottle #2 is empty in the future if you are worried you forgot to fill the Weekly Pill Box   Keep other meds  You have forgetfulness that is related to age and short term memory Hope to improve your sleep to improve this Take Lorazepam Ativan as needed if you have anxiety or panic and to help sleep caution with too much use, sedation grogginess  Ordered MIrtazapine Remeron 7.5mg  nightly for sleep    Please schedule a Follow-up Appointment to: Return if symptoms worsen or fail to improve.  If you have any other questions or concerns, please feel free to call the office or send a message through MyChart. You may also schedule an earlier appointment if necessary.  Additionally, you may be receiving a survey about your experience at our office within a few days to 1 week by e-mail or mail. We value your feedback.  Saralyn Pilar, DO [facility], [place]

## 2022-06-20 NOTE — Progress Notes (Signed)
Subjective:    Patient ID: Paige House, female    DOB: 1934-10-30, 88 y.o.   MRN: [id number]  Paige House is a 88 y.o. female presenting on 06/20/2022 for Memory Loss, Anxiety, and Insomnia   HPI  Anxiety, panic Insomnia Forgetfulness Memory Loss  10 days ago admits memory loss forgetful worsening She feels issue with forgetting to take her medications, forgetting to eat during the day She has pets, forgets to feed them She lives alone and asking for adjustment on medication and advice Admits anxiety and insomnia worsening.  Continues on FLuoxetine with benefit. Asking about other medication for sleep and anxiety.  She has no cognitive or deficit with funciton      06/20/2022    6:38 PM 02/25/2022    2:30 PM 07/16/2021    2:13 PM  Depression screen PHQ 2/9  Decreased Interest 1 1 1   Down, Depressed, Hopeless 1 1 1   PHQ - 2 Score 2 2 2   Altered sleeping 3 3 3   Tired, decreased energy 3 1 1   Change in appetite  3 3  Feeling bad or failure about yourself  2 1 1   Trouble concentrating 2 1 2   Moving slowly or fidgety/restless 0 0 0  Suicidal thoughts 0 0 0  PHQ-9 Score 12 11 12   Difficult doing work/chores Very difficult Not difficult at all Extremely dIfficult    Social History   Tobacco Use   Smoking status: Former    Types: Cigarettes    Start date: 07/26/1985   Smokeless tobacco: Never  Vaping Use   Vaping Use: Never used  Substance Use Topics   Alcohol use: Not Currently    Alcohol/week: 0.0 standard drinks of alcohol   Drug use: No    Review of Systems Per HPI unless specifically indicated above     Objective:    BP (!) 127/58   Pulse 62   Ht 5' (1.524 m)   Wt 149 lb (67.6 kg)   SpO2 97%   BMI 29.10 kg/m   Wt Readings from Last 3 Encounters:  06/20/22 149 lb (67.6 kg)  02/25/22 149 lb 9.6 oz (67.9 kg)  01/31/22 150 lb 3.2 oz (68.1 kg)    Physical Exam Vitals and nursing note reviewed.  Constitutional:      General: She is not  in acute distress.    Appearance: Normal appearance. She is well-developed. She is not diaphoretic.     Comments: Well-appearing, comfortable, cooperative  HENT:     Head: Normocephalic and atraumatic.  Eyes:     General:        Right eye: No discharge.        Left eye: No discharge.     Conjunctiva/sclera: Conjunctivae normal.  Cardiovascular:     Rate and Rhythm: Normal rate.  Pulmonary:     Effort: Pulmonary effort is normal.  Skin:    General: Skin is warm and dry.     Findings: No erythema or rash.  Neurological:     Mental Status: She is alert and oriented to person, place, and time.  Psychiatric:        Mood and Affect: Mood normal.        Behavior: Behavior normal.        Thought Content: Thought content normal.     Comments: Anxious appearing, slightly tearful  Well groomed, good eye contact, normal speech and thoughts    Results for orders placed or performed in visit on 06/22/20  Hemoglobin A1c  Result Value Ref Range   Hgb A1c MFr Bld 5.4 <5.7 % of total Hgb   Mean Plasma Glucose 108 (calc)   eAG (mmol/L) 6.0 (calc)  CBC with Differential/Platelet  Result Value Ref Range   WBC 5.2 3.8 - 10.8 Thousand/uL   RBC 4.60 3.80 - 5.10 Million/uL   Hemoglobin 13.4 11.7 - 15.5 g/dL   HCT 15.1 76.1 - 60.7 %   MCV 89.3 80.0 - 100.0 fL   MCH 29.1 27.0 - 33.0 pg   MCHC 32.6 32.0 - 36.0 g/dL   RDW 37.1 06.2 - 69.4 %   Platelets 163 140 - 400 Thousand/uL   MPV 11.3 7.5 - 12.5 fL   Neutro Abs 3,203 1,500 - 7,800 cells/uL   Lymphs Abs 1,451 850 - 3,900 cells/uL   Absolute Monocytes 348 200 - 950 cells/uL   Eosinophils Absolute 140 15 - 500 cells/uL   Basophils Absolute 57 0 - 200 cells/uL   Neutrophils Relative % 61.6 %   Total Lymphocyte 27.9 %   Monocytes Relative 6.7 %   Eosinophils Relative 2.7 %   Basophils Relative 1.1 %  COMPLETE METABOLIC PANEL WITH GFR  Result Value Ref Range   Glucose, Bld 95 65 - 99 mg/dL   BUN 20 7 - 25 mg/dL   Creat 8.54 (H) 6.27 -  0.88 mg/dL   GFR, Est Non African American 43 (L) > OR = 60 mL/min/1.79m2   GFR, Est African American 50 (L) > OR = 60 mL/min/1.6m2   BUN/Creatinine Ratio 17 6 - 22 (calc)   Sodium 140 135 - 146 mmol/L   Potassium 4.6 3.5 - 5.3 mmol/L   Chloride 104 98 - 110 mmol/L   CO2 29 20 - 32 mmol/L   Calcium 9.4 8.6 - 10.4 mg/dL   Total Protein 6.8 6.1 - 8.1 g/dL   Albumin 4.6 3.6 - 5.1 g/dL   Globulin 2.2 1.9 - 3.7 g/dL (calc)   AG Ratio 2.1 1.0 - 2.5 (calc)   Total Bilirubin 1.1 0.2 - 1.2 mg/dL   Alkaline phosphatase (APISO) 60 37 - 153 U/L   AST 22 10 - 35 U/L   ALT 14 6 - 29 U/L  Lipid panel  Result Value Ref Range   Cholesterol 187 <200 mg/dL   HDL 56 > OR = 50 mg/dL   Triglycerides 035 <009 mg/dL   LDL Cholesterol (Calc) 107 (H) mg/dL (calc)   Total CHOL/HDL Ratio 3.3 <5.0 (calc)   Non-HDL Cholesterol (Calc) 131 (H) <130 mg/dL (calc)  TSH  Result Value Ref Range   TSH 0.61 0.40 - 4.50 mIU/L  Vitamin B12  Result Value Ref Range   Vitamin B-12 490 200 - 1,100 pg/mL  VITAMIN D 25 Hydroxy (Vit-D Deficiency, Fractures)  Result Value Ref Range   Vit D, 25-Hydroxy 27 (L) 30 - 100 ng/mL      Assessment & Plan:   Problem List Items Addressed This Visit     Anxiety   Relevant Medications   mirtazapine (REMERON) 7.5 MG tablet   LORazepam (ATIVAN) 0.5 MG tablet   Other Visit Diagnoses     Insomnia, unspecified type    -  Primary   Relevant Medications   mirtazapine (REMERON) 7.5 MG tablet       Insomnia Anxiety, panic Memory Loss / Forgetfulness - not consistent with dementia or cognitive decline  Start Mirtazapine 7.5mg  nightly for mood/sleep/appetite  Start back on Lorazepam 0.5mg  half or whole tab BID  PRN anxiety panic insomnia, she has tolerated well before and interested to resume therapy  Weekly pill caddy / box instructions  Recommend a 3 bottle system  Bottle = 90 day count - original bottle from pharmacy Bottle #2 is the weekly pill bottle - you take a 1  week supply of that one medicine from Bottle #1 and put it into Bottle #2 Weekly Pill Box - at the start of the week you take the pills from Bottle #2 and put them into the Weekly Pill Box and that way you can see that the Bottle #2 is empty in the future if you are worried you forgot to fill the Weekly Pill Box     Meds ordered this encounter  Medications   mirtazapine (REMERON) 7.5 MG tablet    Sig: Take 1 tablet (7.5 mg total) by mouth at bedtime.    Dispense:  90 tablet    Refill:  1   LORazepam (ATIVAN) 0.5 MG tablet    Sig: Take 1 tablet (0.5 mg total) by mouth 2 (two) times daily as needed for anxiety or sleep.    Dispense:  30 tablet    Refill:  2      Follow up plan: Return if symptoms worsen or fail to improve.    Saralyn Pilar, DO [facility] 06/20/2022, 2:41 PM

## 2022-06-25 ENCOUNTER — Ambulatory Visit: Payer: Self-pay | Admitting: *Deleted

## 2022-06-25 NOTE — Telephone Encounter (Signed)
Summary: discuss medication   Reason for CRM: Patient states mirtazapine (REMERON) 7.5 MG tablet and LORazepam (ATIVAN) 0.5 MG tablet states meddication is not making her sleep at night but does throuhout the day   Patient is inquiring how to regulate medication to help her sleep during the night   Please advise      Answer Assessment - Initial Assessment Questions 1. NAME of MEDICATION: "What medicine are you calling about?"     Ativan and Remeron  2. QUESTION: "What is your question?" (e.g., double dose of medicine, side effect)     Not helping patient sleep 3. PRESCRIBING HCP: "Who prescribed it?" Reason: if prescribed by specialist, call should be referred to that group.     PCP 4. SYMPTOMS: "Do you have any symptoms?"     insomnia 5. SEVERITY: If symptoms are present, ask "Are they mild, moderate or severe?"     *No Answer* 6. PREGNANCY:  "Is there any chance that you are pregnant?" "When was your last menstrual period?"     *No Answer*  Protocols used: Medication Question Call-A-AH

## 2022-06-25 NOTE — Telephone Encounter (Signed)
Third attempt to reach patient regarding medication- left message to call office

## 2022-06-25 NOTE — Telephone Encounter (Signed)
Pt did return call, states took Remeron and Ativan together one night, awake all night and sleepy all day. Did not take Ativan again, just took Remeron at 9 pm (when she goes to bed) and again she was awake all night and sleepy all day. She is desperate to hear back today of what to do for tonight.

## 2022-06-25 NOTE — Telephone Encounter (Signed)
Second attempt to call patient- left message to call office. 

## 2022-06-25 NOTE — Telephone Encounter (Signed)
Attempted to call patient- left message to call office- preliminary information added to protocol questions

## 2022-06-26 NOTE — Telephone Encounter (Signed)
I would recommend giving the medication a bit longer. She has been on it for 1 week at this point.  Mirtazapine 7.5mg  is the correct dose for her.  Higher doses would actually make her less sleepy.  Lorazepam 0.5mg  is an as needed only for anxiety but can also help sedation and sleep.  I am unsure why it is not helping her rest. She could try Lorazepam 1mg  (or 2 pills of the 0.5mg ) at bedtime to help rest.  If this plan still does not work in 1 more week, then we would have to consider discontinue the MIrtazapine and switch to a different medication such as Trazodone.  , DO [facility] 06/26/2022, 10:56 AM

## 2022-06-27 NOTE — Telephone Encounter (Signed)
Pt is aware of plan provided by Dr. Kirtland Bouchard. She states that she has a lot of issues with her cell phone but she doesn't have a house phone. (That is the reason there are failed attempts to reach her). I assisted her with reestablishing her my-chart login. She is going to come by next week for me to add the app to her phone.

## 2022-06-29 ENCOUNTER — Other Ambulatory Visit: Payer: Self-pay | Admitting: Family Medicine

## 2022-06-29 DIAGNOSIS — K644 Residual hemorrhoidal skin tags: Secondary | ICD-10-CM

## 2022-06-29 DIAGNOSIS — L89301 Pressure ulcer of unspecified buttock, stage 1: Secondary | ICD-10-CM

## 2022-07-01 NOTE — Telephone Encounter (Signed)
Requested medications are due for refill today.  unsure  Requested medications are on the active medications list.  yes  Last refill. Proctozone 05/13/2022 30 0 refills, Bactroban 04/23/2022 22g 3 refills  Future visit scheduled.   no  Notes to clinic.  Medications refills are not delegated.    Requested Prescriptions  Pending Prescriptions Disp Refills   PROCTOZONE-HC 2.5 % rectal cream [Pharmacy Med Name: PROCTOZONE HYDROCORTIZONE  2.5%  CRE] 30 g 0    Sig: PLACE 1 APPLICATION RECTALLY  TWICE DAILY FOR 7 TO 10 DAYS OR  UNTIL RESOLVED, FOR INFLAMED  HEMORRHOID     Off-Protocol Failed - 06/29/2022  8:57 PM      Failed - Medication not assigned to a protocol, review manually.      Passed - Valid encounter within last 12 months    Recent Outpatient Visits           1 week ago Insomnia, unspecified type   [facility] [place], Netta Neat, DO   4 months ago Finger pain, right   [facility] [place], Netta Neat, DO   5 months ago Finger pain, right   [facility] Smitty Cords, DO   10 months ago Idiopathic peripheral neuropathy   [facility] Smitty Cords, DO   11 months ago Restless leg syndrome   [facility] Smitty Cords, DO             Not Delegated - Over the Counter: OTC 2 Failed - 06/29/2022  8:57 PM      Failed - This refill cannot be delegated      Passed - Valid encounter within last 12 months    Recent Outpatient Visits           1 week ago Insomnia, unspecified type   [facility] [place], Netta Neat, DO   4 months ago Finger pain, right   [facility] [place], Netta Neat, DO   5 months ago Finger pain, right   [facility] Smitty Cords, DO   10 months ago Idiopathic peripheral neuropathy   [facility] Smitty Cords, DO   11 months ago Restless  leg syndrome   [facility], Netta Neat, DO               mupirocin cream (BACTROBAN) 2 % [Pharmacy Med Name: MUPIROCIN  2%  CRE] 30 g     Sig: APPLY 1 APPLICATION TOPICALLY  TWICE DAILY     Off-Protocol Failed - 06/29/2022  8:57 PM      Failed - Medication not assigned to a protocol, review manually.      Passed - Valid encounter within last 12 months    Recent Outpatient Visits           1 week ago Insomnia, unspecified type   [facility] [place], Netta Neat, DO   4 months ago Finger pain, right   [facility] [place], Netta Neat, DO   5 months ago Finger pain, right   [facility] Smitty Cords, DO   10 months ago Idiopathic peripheral neuropathy   [facility] Smitty Cords, DO   11 months ago Restless leg syndrome   [facility] [place], Netta Neat, [place]

## 2022-07-07 ENCOUNTER — Ambulatory Visit: Payer: Self-pay

## 2022-07-07 NOTE — Telephone Encounter (Signed)
  Chief Complaint: drooling Symptoms: excessive drooling, making chin and lip area tender Frequency: 2 weeks Pertinent Negatives: Patient denies any swallowing diffuculty Disposition: [] ED /[] Urgent Care (no appt availability in office) / [x] Appointment(In office/virtual)/ []  [place] [facility]/ [] Home Care/ [] Refused Recommended Disposition /[] [place] [facility]/ []  Follow-up with PCP Additional Notes: pt calling because she doesn't know whats causing drooling issue if its related to 2 new medications or not. She isnt having any issues with swallowing, eating or drinking. Pt states she is taking the mirtazapine at night and it is helping with sleep and then the lorazepam she is rarely taking. Pt doesn't want the mirtazapine changed but does want something to help with the drooling. Offered pt to come in to see Dr. but pt doesn't have transportation so scheduled telephone visit for 07/09/22 at 1120. Advised I would send message back as well in case can fu without doing appt. Pt verbalized understanding.    Answer Assessment - Initial Assessment Questions 1. DESCRIPTION: "Tell me more about this problem." "Are you  having trouble swallowing liquids, solids, or both?" "Any trouble with swallowing saliva (spit)?"     Drooling  2. SEVERITY: "How bad is the swallowing difficulty?"  (e.g., Scale 1-10; or mild, moderate, severe)   - MILD (0-3): Occasional swallowing difficulty; has trouble swallowing certain types of foods or liquids.   - MODERATE (4-7): Frequent swallowing difficulty; only able to swallow small amounts of foods and fluids.   - SEVERE (8-10): Unable to swallow any foods, fluids, or saliva; sensation of "lump in throat" or "something stuck in throat", and frequent drooling or spitting may be present.     Severe  3. ONSET: "When did the swallowing problems begin?"      2 weeks  4. CAUSE: "What do you think is causing the problem?"  (e.g., dry mouth, food or pill stuck in throat,  mouth pain, sore throat, progression of disease process such as dementia or Parkinson's disease).      Unsure if related to new medications 6. OTHER SYMPTOMS: "Do you have any other symptoms?" (e.g., chest pain, difficulty breathing, mouth sores, sore throat, swollen tongue, chest pain)     Affecting face as in tender  Protocols used: Swallowing Difficulty-A-AH

## 2022-07-09 ENCOUNTER — Ambulatory Visit (INDEPENDENT_AMBULATORY_CARE_PROVIDER_SITE_OTHER): Payer: Medicare Other | Admitting: Family Medicine

## 2022-07-09 ENCOUNTER — Encounter: Payer: Self-pay | Admitting: Family Medicine

## 2022-07-09 ENCOUNTER — Telehealth: Payer: Medicare Other | Admitting: Family Medicine

## 2022-07-09 VITALS — Ht 60.0 in | Wt 149.0 lb

## 2022-07-09 DIAGNOSIS — Z91199 Patient's noncompliance with other medical treatment and regimen due to unspecified reason: Secondary | ICD-10-CM

## 2022-07-09 NOTE — Progress Notes (Signed)
Patient cancelled

## 2022-07-16 ENCOUNTER — Ambulatory Visit: Payer: Medicare Other | Admitting: Family Medicine

## 2022-07-21 ENCOUNTER — Ambulatory Visit: Payer: Medicare Other | Admitting: Family Medicine

## 2022-07-24 ENCOUNTER — Encounter: Payer: Self-pay | Admitting: Family Medicine

## 2022-07-24 ENCOUNTER — Ambulatory Visit (INDEPENDENT_AMBULATORY_CARE_PROVIDER_SITE_OTHER): Payer: Medicare Other | Admitting: Family Medicine

## 2022-07-24 VITALS — BP 129/53 | HR 64 | Ht 60.0 in | Wt 151.0 lb

## 2022-07-24 DIAGNOSIS — R6889 Other general symptoms and signs: Secondary | ICD-10-CM | POA: Diagnosis not present

## 2022-07-24 DIAGNOSIS — N3281 Overactive bladder: Secondary | ICD-10-CM | POA: Diagnosis not present

## 2022-07-24 DIAGNOSIS — R413 Other amnesia: Secondary | ICD-10-CM | POA: Diagnosis not present

## 2022-07-24 MED ORDER — DONEPEZIL HCL 5 MG PO TBDP: 5.0000 mg | ORAL_TABLET | Freq: Every day | ORAL | 0 refills | Status: DC

## 2022-07-24 MED ORDER — OXYBUTYNIN CHLORIDE 5 MG PO TABS
5.0000 mg | ORAL_TABLET | Freq: Three times a day (TID) | ORAL | 0 refills | Status: DC | PRN
Start: 2022-07-24 — End: 2022-08-28

## 2022-07-24 NOTE — Patient Instructions (Addendum)
Thank you for coming to the office today.  Switch the Oxybutynin from the XL night time version now to the 3 times a day version, can take one every 8 hours as needed for overactive bladder. If you need to skip a dose, that is fine.  It may also cause some drying for the saliva. So hopefully that will help.  For memory  Will start Donepezil (Aricept) dissolving tablet 5mg  nightly w/ bedtime. No other interactions  They will contact you to schedule. Can wait the 30 days and decide if you prefer.  [facility] - Neurology Dept [address] [place], [place] Phone: [phone]   Please schedule a Follow-up Appointment to: Return if symptoms worsen or fail to improve.  If you have any other questions or concerns, please feel free to call the office or send a message through MyChart. You may also schedule an earlier appointment if necessary.  Additionally, you may be receiving a survey about your experience at our office within a few days to 1 week by e-mail or mail. We value your feedback.  [phone], DO [facility], [facility]

## 2022-07-24 NOTE — Progress Notes (Signed)
Subjective:    Patient ID: Paige House, female    DOB: 02-Apr-1934, 88 y.o.   MRN: [id number]  Paige House is a 88 y.o. female presenting on 07/24/2022 for Memory Loss, excessive drooling, and overactive bladder   HPI  Memory Loss, Forgetfulness Last visit 06/20/22 she had issues forgetting to take medicines, we discussed strategy with extra pill bottle to dispense. And weekly pill box She is improving with remember to take medication now Still problem with Memory loss, impacting her function, Difficult with word finding She is an active reader, plays games and puzzles, she has difficulty with recall and memory for daily tasks and functions.  Insomnia Sleeping better, doing well on Mirtazapine 7.5mg  nightly  Overactive Bladder OAB Chronic problem. On Oxybutynin XL with some good results in evening but still has symptoms breakthrough during the day Could not start myrbetriq due to cost previously ordered, but has not been able to get this covered. Declines Urology She is interested in daytime version of med  Excessive Salivation Saliva increasing, she is interested in options for this but says she can manage it      06/20/2022    6:38 PM 02/25/2022    2:30 PM 07/16/2021    2:13 PM  Depression screen PHQ 2/9  Decreased Interest 1 1 1   Down, Depressed, Hopeless 1 1 1   PHQ - 2 Score 2 2 2   Altered sleeping 3 3 3   Tired, decreased energy 3 1 1   Change in appetite  3 3  Feeling bad or failure about yourself  2 1 1   Trouble concentrating 2 1 2   Moving slowly or fidgety/restless 0 0 0  Suicidal thoughts 0 0 0  PHQ-9 Score 12 11 12   Difficult doing work/chores Very difficult Not difficult at all Extremely dIfficult    Social History   Tobacco Use   Smoking status: Former    Types: Cigarettes    Start date: 07/26/1985   Smokeless tobacco: Never  Vaping Use   Vaping Use: Never used  Substance Use Topics   Alcohol use: Not Currently    Alcohol/week: 0.0 standard  drinks of alcohol   Drug use: No    Review of Systems Per HPI unless specifically indicated above     Objective:    BP (!) 129/53   Pulse 64   Ht 5' (1.524 m)   Wt 151 lb (68.5 kg)   SpO2 95%   BMI 29.49 kg/m   Wt Readings from Last 3 Encounters:  07/24/22 151 lb (68.5 kg)  07/09/22 149 lb (67.6 kg)  06/20/22 149 lb (67.6 kg)    Physical Exam Vitals and nursing note reviewed.  Constitutional:      General: She is not in acute distress.    Appearance: Normal appearance. She is well-developed. She is not diaphoretic.     Comments: Well-appearing, comfortable, cooperative  HENT:     Head: Normocephalic and atraumatic.  Eyes:     General:        Right eye: No discharge.        Left eye: No discharge.     Conjunctiva/sclera: Conjunctivae normal.  Cardiovascular:     Rate and Rhythm: Normal rate.  Pulmonary:     Effort: Pulmonary effort is normal.  Skin:    General: Skin is warm and dry.     Findings: No erythema or rash.  Neurological:     Mental Status: She is alert and oriented to person, place, and  time.  Psychiatric:        Mood and Affect: Mood normal.        Behavior: Behavior normal.        Thought Content: Thought content normal.     Comments: Well groomed, good eye contact, normal speech and thoughts       07/24/2022    1:30 PM 11/20/2020    2:49 PM  6CIT Screen  What Year? 0 points 0 points  What month? 0 points 0 points  What time? 0 points 0 points  Count back from 20 0 points 0 points  Months in reverse 0 points 0 points  Repeat phrase 0 points 0 points  Total Score 0 points 0 points      Results for orders placed or performed in visit on 06/22/20  Hemoglobin A1c  Result Value Ref Range   Hgb A1c MFr Bld 5.4 <5.7 % of total Hgb   Mean Plasma Glucose 108 (calc)   eAG (mmol/L) 6.0 (calc)  CBC with Differential/Platelet  Result Value Ref Range   WBC 5.2 3.8 - 10.8 Thousand/uL   RBC 4.60 3.80 - 5.10 Million/uL   Hemoglobin 13.4 11.7 -  15.5 g/dL   HCT 03.4 74.2 - 59.5 %   MCV 89.3 80.0 - 100.0 fL   MCH 29.1 27.0 - 33.0 pg   MCHC 32.6 32.0 - 36.0 g/dL   RDW 63.8 75.6 - 43.3 %   Platelets 163 140 - 400 Thousand/uL   MPV 11.3 7.5 - 12.5 fL   Neutro Abs 3,203 1,500 - 7,800 cells/uL   Lymphs Abs 1,451 850 - 3,900 cells/uL   Absolute Monocytes 348 200 - 950 cells/uL   Eosinophils Absolute 140 15 - 500 cells/uL   Basophils Absolute 57 0 - 200 cells/uL   Neutrophils Relative % 61.6 %   Total Lymphocyte 27.9 %   Monocytes Relative 6.7 %   Eosinophils Relative 2.7 %   Basophils Relative 1.1 %  COMPLETE METABOLIC PANEL WITH GFR  Result Value Ref Range   Glucose, Bld 95 65 - 99 mg/dL   BUN 20 7 - 25 mg/dL   Creat 2.95 (H) 1.88 - 0.88 mg/dL   GFR, Est Non African American 43 (L) > OR = 60 mL/min/1.9m2   GFR, Est African American 50 (L) > OR = 60 mL/min/1.63m2   BUN/Creatinine Ratio 17 6 - 22 (calc)   Sodium 140 135 - 146 mmol/L   Potassium 4.6 3.5 - 5.3 mmol/L   Chloride 104 98 - 110 mmol/L   CO2 29 20 - 32 mmol/L   Calcium 9.4 8.6 - 10.4 mg/dL   Total Protein 6.8 6.1 - 8.1 g/dL   Albumin 4.6 3.6 - 5.1 g/dL   Globulin 2.2 1.9 - 3.7 g/dL (calc)   AG Ratio 2.1 1.0 - 2.5 (calc)   Total Bilirubin 1.1 0.2 - 1.2 mg/dL   Alkaline phosphatase (APISO) 60 37 - 153 U/L   AST 22 10 - 35 U/L   ALT 14 6 - 29 U/L  Lipid panel  Result Value Ref Range   Cholesterol 187 <200 mg/dL   HDL 56 > OR = 50 mg/dL   Triglycerides 416 <606 mg/dL   LDL Cholesterol (Calc) 107 (H) mg/dL (calc)   Total CHOL/HDL Ratio 3.3 <5.0 (calc)   Non-HDL Cholesterol (Calc) 131 (H) <130 mg/dL (calc)  TSH  Result Value Ref Range   TSH 0.61 0.40 - 4.50 mIU/L  Vitamin B12  Result Value Ref Range  Vitamin B-12 490 200 - 1,100 pg/mL  VITAMIN D 25 Hydroxy (Vit-D Deficiency, Fractures)  Result Value Ref Range   Vit D, 25-Hydroxy 27 (L) 30 - 100 ng/mL      Assessment & Plan:   Problem List Items Addressed This Visit     OAB (overactive bladder)     OAB, improved in PM still has daytime symptoms Unable to afford Myrbetriq  Improved on Oxybutynin XL 5 nightly, we discussed today option of switching to TID version for better daytime coverage, she can adjust or skip dose if need, new rx Oxybutynin 5mg  TID PRN  Discussed this may cause some dry mouth that could help her problem with anticholinergic effect, instead of doing separate med glycopyrrolate for saliva      Relevant Medications   oxybutynin (DITROPAN) 5 MG tablet   Other Visit Diagnoses     Memory loss    -  Primary   Relevant Medications   donepezil (ARICEPT ODT) 5 MG disintegrating tablet   Other Relevant Orders   Ambulatory referral to Neurology   Forgetfulness       Relevant Medications   donepezil (ARICEPT ODT) 5 MG disintegrating tablet   Other Relevant Orders   Ambulatory referral to Neurology       Memory Discussion today that her cognitive function remains intact 6CIT normal She has worsening forgetfulness memory, improved behavioral strategies with pill bottle dispense weekly pill box strategy, reminders alarms. Improved med adherence She does live alone and has limited other help Will agree to pursue Aricept 5mg  ODT discussed potential side effects benefit risk side effect May consider switch to namenda in future if indicated or not tolerate aricept Referral also to Neurology [facility] for consultation further eval maybe imaging, but again reassurance was given today given her overall intact cognition  Insomnia Continue Mirtazapine  Orders Placed This Encounter  Procedures   Ambulatory referral to Neurology    Referral Priority:   Routine    Referral Type:   Consultation    Referral Reason:   Specialty Services Required    Requested Specialty:   Neurology    Number of Visits Requested:   1     Meds ordered this encounter  Medications   donepezil (ARICEPT ODT) 5 MG disintegrating tablet    Sig: Take 1 tablet (5 mg total) by mouth at bedtime.     Dispense:  30 tablet    Refill:  0   oxybutynin (DITROPAN) 5 MG tablet    Sig: Take 1 tablet (5 mg total) by mouth every 8 (eight) hours as needed for bladder spasms (overactive bladder).    Dispense:  90 tablet    Refill:  0      Follow up plan: Return if symptoms worsen or fail to improve.   , DO [facility] 07/24/2022, 11:14 AM

## 2022-07-24 NOTE — Assessment & Plan Note (Signed)
OAB, improved in PM still has daytime symptoms Unable to afford Myrbetriq  Improved on Oxybutynin XL 5 nightly, we discussed today option of switching to TID version for better daytime coverage, she can adjust or skip dose if need, new rx Oxybutynin 5mg  TID PRN  Discussed this may cause some dry mouth that could help her problem with anticholinergic effect, instead of doing separate med glycopyrrolate for saliva

## 2022-07-25 ENCOUNTER — Ambulatory Visit: Payer: Medicare Other

## 2022-07-26 ENCOUNTER — Other Ambulatory Visit: Payer: Self-pay | Admitting: Family Medicine

## 2022-07-26 DIAGNOSIS — E782 Mixed hyperlipidemia: Secondary | ICD-10-CM

## 2022-07-28 NOTE — Telephone Encounter (Signed)
Requested medication (s) are due for refill today: yes  Requested medication (s) are on the active medication list: yes  Last refill:  08/01/21 #90 with 3 RF  Future visit scheduled: no, canceled 07/16/22 NO SHOW 07/09/22 and 07/31/22  Notes to clinic:  Failed protocol of labs within 12 months, Lipids from 06/22/2020, no upcoming appt, please assess.       Requested Prescriptions  Pending Prescriptions Disp Refills   pravastatin (PRAVACHOL) 40 MG tablet [Pharmacy Med Name: Pravastatin Sodium 40 MG Oral Tablet] 100 tablet 2    Sig: TAKE 1 TABLET BY MOUTH  DAILY     Cardiovascular:  Antilipid - Statins Failed - 07/26/2022 11:38 PM      Failed - Lipid Panel in normal range within the last 12 months    Cholesterol, Total  Date Value Ref Range Status  08/24/2015 244 (H) 100 - 199 mg/dL Final   Cholesterol  Date Value Ref Range Status  06/22/2020 187 <200 mg/dL Final   LDL Cholesterol (Calc)  Date Value Ref Range Status  06/22/2020 107 (H) mg/dL (calc) Final    Comment:    Reference range: <100 . Desirable range <100 mg/dL for primary prevention;   <70 mg/dL for patients with CHD or diabetic patients  with > or = 2 CHD risk factors. Marland Kitchen LDL-C is now calculated using the Martin-Hopkins  calculation, which is a validated novel method providing  better accuracy than the Friedewald equation in the  estimation of LDL-C.  Horald Pollen et al. Lenox Ahr. 9767;341(93): 2061-2068  (http://education.[employer].com/faq/FAQ164)    HDL  Date Value Ref Range Status  06/22/2020 56 > OR = 50 mg/dL Final  79/01/4096 56 >35 mg/dL Final    Comment:    According to ATP-III Guidelines, HDL-C >59 mg/dL is considered a negative risk factor for CHD.    Triglycerides  Date Value Ref Range Status  06/22/2020 128 <150 mg/dL Final         Passed - Patient is not pregnant      Passed - Valid encounter within last 12 months    Recent Outpatient Visits           4 days ago Memory loss   [facility] [employer], Netta Neat, DO   2 weeks ago No-show for appointment   [facility] Smitty Cords, DO   1 month ago Insomnia, unspecified type   [facility] [place], Netta Neat, DO   5 months ago Finger pain, right   [facility] [place], Netta Neat, DO   5 months ago Finger pain, right   [facility] [place], Netta Neat, [place]

## 2022-08-08 ENCOUNTER — Ambulatory Visit: Payer: Self-pay | Admitting: *Deleted

## 2022-08-08 NOTE — Telephone Encounter (Signed)
Summary: Pressure sore   Patient called to get pcp to prescribe a different ointment for her pressure sores. She said the mupirocin ointment (BACTROBAN) 2 % is not working. Patient states that the sore is open and she does not want it to get infected. Please advise.       2nd call attempt to contact patient to review sx of worsening pressure sore. No answer. LVMTCB [phone].

## 2022-08-08 NOTE — Telephone Encounter (Signed)
Summary: Pressure sore   Patient called to get pcp to prescribe a different ointment for her pressure sores. She said the mupirocin ointment (BACTROBAN) 2 % is not working. Patient states that the sore is open and she does not want it to get infected. Please advise.     Attempted to call patient- left message to call office.

## 2022-08-08 NOTE — Telephone Encounter (Signed)
Third attempt to reach patient- no answer- left message to call office to speak with nurse.

## 2022-08-11 ENCOUNTER — Ambulatory Visit: Payer: Self-pay | Admitting: *Deleted

## 2022-08-11 NOTE — Telephone Encounter (Signed)
The only rx medicated topicals for helping wound heal are:  Mupirocin (topical antibiotic) ointment ( that she is referring to already )  Silvadene cream (usually for skin wounds or burns) we have ordered for her before.  I don't have any other rx topicals for wound healing available.  Please let me know if she wants to try the Silvadene cream again.  Saralyn Pilar, DO [facility] 08/11/2022, 11:37 AM

## 2022-08-11 NOTE — Telephone Encounter (Signed)
  Chief Complaint: Has a pressure sore on buttock that has been there a long while.   "Dr. Marland KitchenK" is aware of it" Symptoms: Requesting medication to help it heal.   The Medicine he gave me starting with a "M" helped it scab over but I need something to help it heal. Frequency: Daily for a "long while" Pertinent Negatives: Patient denies bleeding, infection or pain Disposition: [] ED /[] Urgent Care (no appt availability in office) / [] Appointment(In office/virtual)/ []  [facility] [place] [facility]/ [] Home Care/ [] Refused Recommended Disposition /[] [facility] [facility]/ [x]  Follow-up with PCP Additional Notes: Did not want an appt just requesting a medication to help it heal from where she rubbed off part of the scab while wiping after going to the toilet.  Message sent to Dr. as pt's request.   Pt agreeable to someone calling her back.

## 2022-08-11 NOTE — Telephone Encounter (Signed)
I called on Friday for Dr. Kirtland Bouchard to prescribe something for my pressure sore.    I need something to make it scab up.   The medicine "M" is not working.    Can he order something for me?    Reason for Disposition  [1] Using antibiotic ointment > 1 week AND [2] sore not completely healed    Has pressure sore on buttock  Answer Assessment - Initial Assessment Questions 1. APPEARANCE of SORES: "What do the sores look like?"     I have a Pressure sore on my butt.   Dr. Kirtland Bouchard" knows about it.   It scabbed up with the medication he gave me that started with an "M".     From wiping I rubbed off part of it the scab.    It's not bleeding or sore.   I'm using triple antibiotic on it now to prevent it from getting infected.  2. NUMBER: "How many sores are there?"     One I just need Dr. Marland KitchenK" to prescribe or recommend something for me to help it heal where I rubbed off part of the scab from wiping after going to the toilet.   It is not infected, bleeding or anything but I don't want it to get infected so that's why I'm asking for something to help it heal. 3. SIZE: "How big is the largest sore?"     It's on my butt.   I sit all the time and read. 4. LOCATION: "Where are the sores located?"     Buttock 5. ONSET: "When did the sores begin?"     "I had this for a long while".   "Dr. Marland KitchenK" know about it" 6. TENDER: "Does it hurt when you touch it?"  (Scale 1-10; or mild, moderate, severe)      No 7. CAUSE: "What do you think is causing the sores?"     I sit all the time reading.  8. OTHER SYMPTOMS: "Do you have any other symptoms?" (e.g., fever, new weakness)     No  Protocols used: Sores-A-AH

## 2022-08-12 ENCOUNTER — Telehealth: Payer: Self-pay | Admitting: Family Medicine

## 2022-08-12 DIAGNOSIS — L89301 Pressure ulcer of unspecified buttock, stage 1: Secondary | ICD-10-CM

## 2022-08-12 MED ORDER — SILVER SULFADIAZINE 1 % EX CREA: 1.0000 | TOPICAL_CREAM | Freq: Every day | CUTANEOUS | 3 refills | Status: AC

## 2022-08-12 NOTE — Telephone Encounter (Signed)
Patient requesting new Rx- sent for provider Rx

## 2022-08-12 NOTE — Telephone Encounter (Signed)
The patient called in stating she really needs a cream for her open bed sore. She said the Silvadene cream the provider mentioned would be great. If this can be called into [facility] so they can deliver it she would appreciate it. Please assist patient further  [facility] - [place], [place] - [address] Phone:  [phone]  Fax:  [phone]      Patient has had this cream before and it is listed with her meds on file.

## 2022-08-12 NOTE — Telephone Encounter (Signed)
I have sent new rx Silvadene cream to [facility]  Saralyn Pilar, DO [facility] 08/12/2022, 11:45 AM

## 2022-08-27 ENCOUNTER — Other Ambulatory Visit: Payer: Self-pay | Admitting: Family Medicine

## 2022-08-27 DIAGNOSIS — R6889 Other general symptoms and signs: Secondary | ICD-10-CM

## 2022-08-27 DIAGNOSIS — R413 Other amnesia: Secondary | ICD-10-CM

## 2022-08-27 DIAGNOSIS — F419 Anxiety disorder, unspecified: Secondary | ICD-10-CM

## 2022-08-27 DIAGNOSIS — N3281 Overactive bladder: Secondary | ICD-10-CM

## 2022-08-28 NOTE — Telephone Encounter (Signed)
Requested medication (s) are due for refill today: yes  Requested medication (s) are on the active medication list: yes  Last refill:  aricept- 07/24/22 #30 0 refills, ditropan- 07/24/22 #90 0 refills, ativan- 06/20/22 #30 2 refills  Future visit scheduled: no  Notes to clinic:  not delegated per protocol. No refills remain. Do you want to refill Rxs?     Requested Prescriptions  Pending Prescriptions Disp Refills   donepezil (ARICEPT) 5 MG tablet [Pharmacy Med Name: DONEPEZIL HCL 5 MG TAB] 30 tablet     Sig: TAKE 1 TABLET BY MOUTH AT BEDTIME     Neurology:  Alzheimer's Agents Passed - 08/27/2022 10:57 AM      Passed - Valid encounter within last 6 months    Recent Outpatient Visits           1 month ago Memory loss   [facility] [place], Netta Neat, DO   1 month ago No-show for appointment   [facility] [place], Netta Neat, DO   2 months ago Insomnia, unspecified type   [facility] [place], Netta Neat, DO   6 months ago Finger pain, right   [facility] [place], Netta Neat, DO   6 months ago Finger pain, right   [facility] [place], Netta Neat, DO               oxybutynin (DITROPAN) 5 MG tablet [Pharmacy Med Name: OXYBUTYNIN CHLORIDE 5 MG TAB] 90 tablet 0    Sig: TAKE 1 TABLET BY MOUTH EVERY 8 HOURS AS NEEDED FOR BLADDER SPASMS Peggye Ley)     Urology:  Bladder Agents Passed - 08/27/2022 10:57 AM      Passed - Valid encounter within last 12 months    Recent Outpatient Visits           1 month ago Memory loss   [facility] [place], Netta Neat, DO   1 month ago No-show for appointment   [facility] [place], Netta Neat, DO   2 months ago Insomnia, unspecified type   [facility] [place], Netta Neat, DO   6 months ago Finger pain, right   [facility] [place], Netta Neat, DO   6  months ago Finger pain, right   [facility], Netta Neat, DO               LORazepam (ATIVAN) 0.5 MG tablet [Pharmacy Med Name: LORAZEPAM 0.5 MG TAB] 30 tablet     Sig: TAKE 1 TABLET BY MOUTH TWICE DAILY AS NEEDED FOR ANXIETY OR SLEEP     Not Delegated - Psychiatry: Anxiolytics/Hypnotics 2 Failed - 08/27/2022 10:57 AM      Failed - This refill cannot be delegated      Failed - Urine Drug Screen completed in last 360 days      Passed - Patient is not pregnant      Passed - Valid encounter within last 6 months    Recent Outpatient Visits           1 month ago Memory loss   [facility] [place], Netta Neat, DO   1 month ago No-show for appointment   [facility] [place], Netta Neat, DO   2 months ago Insomnia, unspecified type   [facility] [place], Netta Neat, DO   6 months ago Finger pain, right   [address]  [facility] Smit[facility] Cords, DO   6 months ago Finger pain, right   [facility] [place], Netta Neat, [place]

## 2022-08-28 NOTE — Telephone Encounter (Signed)
Patient is requesting refills for 30 days to [facility]  Can you please contact patient and confirm this? She usually uses 90 day through [facility] for mail order.  Please let me know  Saralyn Pilar, DO [facility] 08/28/2022, 1:40 PM

## 2022-08-30 ENCOUNTER — Other Ambulatory Visit: Payer: Self-pay | Admitting: Family Medicine

## 2022-08-30 DIAGNOSIS — N3281 Overactive bladder: Secondary | ICD-10-CM

## 2022-09-01 NOTE — Telephone Encounter (Signed)
Requested by interface surescripts. Medication discontinued 07/24/22 for XL.  Requested Prescriptions  Refused Prescriptions Disp Refills  . oxybutynin (DITROPAN-XL) 5 MG 24 hr tablet [Pharmacy Med Name: Oxybutynin Chloride ER 5 MG Oral Tablet Extended Release 24 Hour] 90 tablet 3    Sig: TAKE 1 TABLET BY MOUTH AT  BEDTIME     Urology:  Bladder Agents Passed - 08/30/2022  5:04 AM      Passed - Valid encounter within last 12 months    Recent Outpatient Visits          1 month ago Memory loss   [place], DO   1 month ago No-show for appointment   [place], DO   2 months ago Insomnia, unspecified type   [place], DO   6 months ago Finger pain, right   [place], DO   7 months ago Finger pain, right   [place], [place]

## 2022-09-13 ENCOUNTER — Other Ambulatory Visit: Payer: Self-pay | Admitting: Family Medicine

## 2022-09-13 DIAGNOSIS — G2581 Restless legs syndrome: Secondary | ICD-10-CM

## 2022-09-13 DIAGNOSIS — G609 Hereditary and idiopathic neuropathy, unspecified: Secondary | ICD-10-CM

## 2022-09-15 NOTE — Telephone Encounter (Signed)
Requested Prescriptions  Pending Prescriptions Disp Refills  . gabapentin (NEURONTIN) 600 MG tablet [Pharmacy Med Name: Gabapentin 600 MG Oral Tablet] 300 tablet 0    Sig: TAKE 1 TABLET BY MOUTH 3  TIMES DAILY     Neurology: Anticonvulsants - gabapentin Failed - 09/13/2022 11:25 PM      Failed - Cr in normal range and within 360 days    Creat  Date Value Ref Range Status  06/22/2020 1.17 (H) 0.60 - 0.88 mg/dL Final    Comment:    For patients >64 years of age, the reference limit for Creatinine is approximately 13% higher for people identified as African-American. Renella Cunas - Completed PHQ-2 or PHQ-9 in the last 360 days      Passed - Valid encounter within last 12 months    Recent Outpatient Visits          1 month ago Memory loss   [place], DO   2 months ago No-show for appointment   [place], DO   2 months ago Insomnia, unspecified type   [place], DO   6 months ago Finger pain, right   [place], DO   7 months ago Finger pain, right   [place], Devonne Doughty, [place]

## 2022-09-23 ENCOUNTER — Other Ambulatory Visit: Payer: Self-pay | Admitting: Family Medicine

## 2022-09-23 DIAGNOSIS — K589 Irritable bowel syndrome without diarrhea: Secondary | ICD-10-CM

## 2022-09-23 DIAGNOSIS — G4700 Insomnia, unspecified: Secondary | ICD-10-CM

## 2022-09-23 NOTE — Telephone Encounter (Signed)
Medication Refill - Medication: diphenoxylate-atropine (LOMOTIL) 2.5-0.025 MG tablet  mirtazapine (REMERON) 7.5 MG tablet  Has the patient contacted their pharmacy? Yes.   (Agent: If no, request that the patient contact the pharmacy for the refill. If patient does not wish to contact the pharmacy document the reason why and proceed with request.) (Agent: If yes, when and what did the pharmacy advise?)  Preferred Pharmacy (with phone number or street name):  [place] [place] [place] [address]  Phone: [phone] Fax: [phone]   Has the patient been seen for an appointment in the last year OR does the patient have an upcoming appointment? Yes.    Agent: Please be advised that RX refills may take up to 3 business days. We ask that you follow-up with your pharmacy.

## 2022-09-24 MED ORDER — MIRTAZAPINE 7.5 MG PO TABS: 7.5000 mg | ORAL_TABLET | Freq: Every day | ORAL | 0 refills | Status: DC

## 2022-09-24 NOTE — Telephone Encounter (Signed)
Requested medication (s) are due for refill today: yes  Requested medication (s) are on the active medication list: yes    Last refill: 12/02/21  #60  3 refills  Future visit scheduled no  Notes to clinic:Not delegated, please review. Thank you.  Requested Prescriptions  Pending Prescriptions Disp Refills   diphenoxylate-atropine (LOMOTIL) 2.5-0.025 MG tablet 60 tablet 3     Not Delegated - Gastroenterology:  Antidiarrheals Failed - 09/23/2022  3:10 PM      Failed - This refill cannot be delegated      Passed - Valid encounter within last 12 months    Recent Outpatient Visits           2 months ago Memory loss   [place], DO   2 months ago No-show for appointment   [place], DO   3 months ago Insomnia, unspecified type   [place], DO   7 months ago Finger pain, right   [place], DO   7 months ago Finger pain, right   [facility] [place], Devonne Doughty, DO              Signed Prescriptions Disp Refills   mirtazapine (REMERON) 7.5 MG tablet 90 tablet 0    Sig: Take 1 tablet (7.5 mg total) by mouth at bedtime.     Psychiatry: Antidepressants - mirtazapine Passed - 09/23/2022  3:10 PM      Passed - Completed PHQ-2 or PHQ-9 in the last 360 days      Passed - Valid encounter within last 6 months    Recent Outpatient Visits           2 months ago Memory loss   [place], DO   2 months ago No-show for appointment   [place], DO   3 months ago Insomnia, unspecified type   [place], DO   7 months ago Finger pain, right   [place], DO   7 months ago Finger pain, right   [place], Devonne Doughty, [place]

## 2022-09-24 NOTE — Telephone Encounter (Signed)
Patient states she will run out of     diphenoxylate-atropine (LOMOTIL) 2.5-0.025 MG tablet   and she no longer uses [place] and would like PCP to send medication [facility] as soon as possible.

## 2022-09-24 NOTE — Telephone Encounter (Signed)
Requested Prescriptions  Pending Prescriptions Disp Refills  . mirtazapine (REMERON) 7.5 MG tablet 90 tablet 0    Sig: Take 1 tablet (7.5 mg total) by mouth at bedtime.     Psychiatry: Antidepressants - mirtazapine Passed - 09/23/2022  3:10 PM      Passed - Completed PHQ-2 or PHQ-9 in the last 360 days      Passed - Valid encounter within last 6 months    Recent Outpatient Visits          2 months ago Memory loss   [place], DO   2 months ago No-show for appointment   [place], DO   3 months ago Insomnia, unspecified type   [place], DO   7 months ago Finger pain, right   [place], DO   7 months ago Finger pain, right   [facility], Devonne Doughty, DO             . diphenoxylate-atropine (LOMOTIL) 2.5-0.025 MG tablet 60 tablet 3     Not Delegated - Gastroenterology:  Antidiarrheals Failed - 09/23/2022  3:10 PM      Failed - This refill cannot be delegated      Passed - Valid encounter within last 12 months    Recent Outpatient Visits          2 months ago Memory loss   [place], DO   2 months ago No-show for appointment   [place], DO   3 months ago Insomnia, unspecified type   [place], DO   7 months ago Finger pain, right   [place], DO   7 months ago Finger pain, right   [place], Devonne Doughty, [place]

## 2022-09-25 MED ORDER — DIPHENOXYLATE-ATROPINE 2.5-0.025 MG PO TABS: ORAL_TABLET | ORAL | 3 refills | Status: DC

## 2022-10-22 ENCOUNTER — Other Ambulatory Visit: Payer: Self-pay | Admitting: Family Medicine

## 2022-10-22 ENCOUNTER — Ambulatory Visit: Payer: Self-pay | Admitting: *Deleted

## 2022-10-22 DIAGNOSIS — F419 Anxiety disorder, unspecified: Secondary | ICD-10-CM
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
## 2022-10-22 NOTE — Telephone Encounter (Signed)
   Notes to clinic:  Duplicate request, non delegated.      Requested Prescriptions  Pending Prescriptions Disp Refills   LORazepam (ATIVAN) 0.5 MG tablet 30 tablet 2     Not Delegated - Psychiatry: Anxiolytics/Hypnotics 2 Failed - 10/22/2022  4:37 PM      Failed - This refill cannot be delegated      Failed - Urine Drug Screen completed in last 360 days      Passed - Patient is not pregnant      Passed - Valid encounter within last 6 months    Recent Outpatient Visits           3 months ago Memory loss   [facility] [place], Netta Neat, DO   3 months ago No-show for appointment   [facility], Netta Neat, DO   4 months ago Insomnia, unspecified type   [facility], Netta Neat, DO   7 months ago Finger pain, right   [facility] [place], Netta Neat, DO   8 months ago Finger pain, right   [facility] [place], Netta Neat, [place]

## 2022-10-22 NOTE — Telephone Encounter (Signed)
Medication Refill - Medication: LORazepam (ATIVAN) 0.5 MG tablet   Has the patient contacted their pharmacy? Yes.   (Agent: If no, request that the patient contact the pharmacy for the refill. If patient does not wish to contact the pharmacy document the reason why and proceed with request.) (Agent: If yes, when and what did the pharmacy advise?)  Preferred Pharmacy (with phone number or street name):  [facility] [place], [place] - [address]  [address] [place] [address] [place] [address]  Phone: [phone] Fax: [phone]   Has the patient been seen for an appointment in the last year OR does the patient have an upcoming appointment? Yes.    Agent: Please be advised that RX refills may take up to 3 business days. We ask that you follow-up with your pharmacy.
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
## 2022-10-23 MED ORDER — LORAZEPAM 0.5 MG PO TABS: ORAL_TABLET | ORAL | 2 refills | Status: DC

## 2022-10-23 NOTE — Telephone Encounter (Signed)
Sent rx already  Saralyn Pilar, DO [facility] 10/23/2022, 10:53 AM

## 2022-11-11 ENCOUNTER — Telehealth: Payer: Self-pay

## 2022-11-11 ENCOUNTER — Other Ambulatory Visit: Payer: Self-pay

## 2022-11-11 DIAGNOSIS — K589 Irritable bowel syndrome without diarrhea: Secondary | ICD-10-CM

## 2022-11-11 MED ORDER — DIPHENOXYLATE-ATROPINE 2.5-0.025 MG PO TABS: ORAL_TABLET | ORAL | 3 refills | Status: DC

## 2022-11-11 MED ORDER — DIPHENOXYLATE-ATROPINE 2.5-0.025 MG PO TABS: ORAL_TABLET | ORAL | 2 refills | Status: DC

## 2022-11-11 NOTE — Telephone Encounter (Signed)
Copied from CRM [id number]. Topic: General - Other >> Nov 11, 2022 10:28 AM VBTYOMAY J wrote: Reason for CRM: pt called in for assistance. Pt says that [facility] that [facility] are out of stock diphenoxylate-atropine (LOMOTIL) 2.5-0.025 MG tablet. Pt has 2 refills on file. Pt would like to have sent to her local pharmacy instead.   Pharmacy: [facility] - [place], [place] - [address]  Please assist pt further.

## 2022-11-11 NOTE — Telephone Encounter (Signed)
Please let her know that I have sent rx Lomotil as requested to [facility]  Saralyn Pilar, DO [facility] 11/11/2022, 12:49 PM

## 2022-11-12 NOTE — Telephone Encounter (Signed)
Pt called to thank her PCP

## 2022-11-18 ENCOUNTER — Ambulatory Visit: Payer: Self-pay | Admitting: *Deleted

## 2022-11-18 DIAGNOSIS — G2581 Restless legs syndrome: Secondary | ICD-10-CM

## 2022-11-18 DIAGNOSIS — G609 Hereditary and idiopathic neuropathy, unspecified: Secondary | ICD-10-CM

## 2022-11-18 MED ORDER — GABAPENTIN 600 MG PO TABS: 600.0000 mg | ORAL_TABLET | Freq: Three times a day (TID) | ORAL | 3 refills | Status: DC

## 2022-11-18 NOTE — Telephone Encounter (Signed)
  Chief Complaint: medication request- restless leg flare Symptoms: patient is requesting medication for her restless leg- patient states she had medication called in by PCP and needs it again- leg heaviness, tingling at night- left leg, no other symptoms Frequency: 3-4 nights Pertinent Negatives: Patient denies pain, swelling Disposition: [] ED /[] Urgent Care (no appt availability in office) / [] Appointment(In office/virtual)/ []  [place] [facility]/ [] Home Care/ [x] Refused Recommended Disposition /[] [place] Mobile Bus/ []  Follow-up with PCP Additional Notes: Patient requesting medication be called to [facility]. Patient states she was treated previously and would like RF of medication- Gabapentin.  Patient advised it has been over 1 year since evaluated for this- she does not feel that appointment is necessary.

## 2022-11-18 NOTE — Addendum Note (Signed)
Addended by: Smitty Cords on: 11/18/2022 12:42 PM   Modules accepted: Orders

## 2022-11-18 NOTE — Telephone Encounter (Signed)
Reason for Disposition . [1] Numbness (i.e., loss of sensation) of the face, arm / hand, or leg / foot on one side of the body AND [2] sudden onset AND [3] brief (now gone)  Answer Assessment - Initial Assessment Questions 1. SYMPTOM: "What is the main symptom you are concerned about?" (e.g., weakness, numbness)     Leg heaviness, numbness at night, left leg 2. ONSET: "When did this start?" (minutes, hours, days; while sleeping)     3-4 nights 3. LAST NORMAL: "When was the last time you (the patient) were normal (no symptoms)?"     Changes at night 4. PATTERN "Does this come and go, or has it been constant since it started?"  "Is it present now?"     Comes and goes- only at night 5. CARDIAC SYMPTOMS: "Have you had any of the following symptoms: chest pain, difficulty breathing, palpitations?"     no 6. NEUROLOGIC SYMPTOMS: "Have you had any of the following symptoms: headache, dizziness, vision loss, double vision, changes in speech, unsteady on your feet?"     no 7. OTHER SYMPTOMS: "Do you have any other symptoms?"     No other symptoms  Protocols used: Neurologic Deficit-A-AH

## 2022-11-18 NOTE — Telephone Encounter (Signed)
Can you please clarify the dosage and pharmacy for Gabapentin?  She had a dose change before. I want to confirm her dose.  Last rx was sent to her mail order 09/2022  It was for Gabapentin 600mg  - take 1 pill 3 times a day.  However, in the past I believe she took more pills at bedtime with the lower dose.  Can you confirm the following?  Requested mg of the gabapentin 300 or 600mg   Dosing of Gabapentin how many times per day etc  And confirm which pharmacy  I tried calling her but did not reach her, I left a voicemail  , DO [facility] 11/18/2022, 12:42 PM

## 2022-11-18 NOTE — Addendum Note (Signed)
Addended by: Smitty Cords on: 11/18/2022 01:54 PM   Modules accepted: Orders

## 2022-11-24 ENCOUNTER — Ambulatory Visit: Payer: Self-pay

## 2022-11-24 NOTE — Telephone Encounter (Signed)
  Chief Complaint: Medication question Symptoms:  Frequency:  Pertinent Negatives: Patient denies  Disposition: [] ED /[] Urgent Care (no appt availability in office) / [] Appointment(In office/virtual)/ []  [place] [facility]/ [] Home Care/ [] Refused Recommended Disposition /[] [place] Mobile Bus/ [x]  Follow-up with PCP Additional Notes:  PT currently takes one 600mg  gabapentin 3 times daily. Pt would like to be able to take two 600mg  Gabapentin in the evening when her restless legs are acting up.  Please advise.     Summary: discuss medication   Pt states gabapentin (NEURONTIN) 600 MG tablet is not the medication she requested for restless leg syndrome  Pt requesting a cb to discuss the medication that was prescribed previously  Pt does not know the name of the medication  Please reference 12-05 NT TE     Reason for Disposition  [1] Caller has NON-URGENT medicine question about med that PCP prescribed AND [2] triager unable to answer question  Answer Assessment - Initial Assessment Questions 1. NAME of MEDICINE: "What medicine(s) are you calling about?"     Gabapentin 2. QUESTION: "What is your question?" (e.g., double dose of medicine, side effect)     PT currently takes 1 600mg  gabapentin 3 times daily. Pt would like to be able to take 2 600mg  Gabapentin in the evening when her restless legs are acting up. 3. PRESCRIBER: "Who prescribed the medicine?" Reason: if prescribed by specialist, call should be referred to that group.     Dr. 4. SYMPTOMS: "Do you have any symptoms?" If Yes, ask: "What symptoms are you having?"  "How bad are the symptoms (e.g., mild, moderate, severe)      5. PREGNANCY:  "Is there any chance that you are pregnant?" "When was your last menstrual period?"  Protocols used: Medication Question Call-A-AH

## 2022-11-25 ENCOUNTER — Other Ambulatory Visit: Payer: Self-pay | Admitting: Family Medicine

## 2022-11-25 DIAGNOSIS — G4700 Insomnia, unspecified: Secondary | ICD-10-CM

## 2022-11-25 NOTE — Telephone Encounter (Signed)
Called patient.  She asks for permission to take 1 extra Gabapentin 600mg  at bedtime if flare of RLS  She may do this and take 600mg  in AM / 600mg  in afternoon / 600+600mg  in PM at bed if need  If run out quicker we can order higher pill count in future  , DO [facility] 11/25/2022, 5:05 PM

## 2022-12-12 ENCOUNTER — Other Ambulatory Visit: Payer: Self-pay | Admitting: Family Medicine

## 2022-12-12 DIAGNOSIS — R413 Other amnesia: Secondary | ICD-10-CM

## 2022-12-12 DIAGNOSIS — R6889 Other general symptoms and signs: Secondary | ICD-10-CM

## 2022-12-12 MED ORDER — DONEPEZIL HCL 5 MG PO TABS: 5.0000 mg | ORAL_TABLET | Freq: Every day | ORAL | 0 refills | Status: DC

## 2022-12-12 NOTE — Telephone Encounter (Signed)
Requested Prescriptions  Pending Prescriptions Disp Refills   donepezil (ARICEPT) 5 MG tablet 90 tablet 0    Sig: Take 1 tablet (5 mg total) by mouth at bedtime.     Neurology:  Alzheimer's Agents Passed - 12/12/2022 12:27 PM      Passed - Valid encounter within last 6 months    Recent Outpatient Visits           4 months ago Memory loss   [facility] [place], Netta Neat, DO   5 months ago No-show for appointment   [facility] [place], Netta Neat, DO   5 months ago Insomnia, unspecified type   [facility] Smitty Cords, DO   9 months ago Finger pain, right   [facility] [place], Netta Neat, DO   10 months ago Finger pain, right   [facility] [place], Netta Neat, [place]

## 2022-12-12 NOTE — Telephone Encounter (Signed)
Medication Refill - Medication: donepezil (ARICEPT) 5 MG tablet [[id number]]   Has the patient contacted their pharmacy? Yes.   (Agent: If no, request that the patient contact the pharmacy for the refill. If patient does not wish to contact the pharmacy document the reason why and proceed with request.) (Agent: If yes, when and what did the pharmacy advise?)  Preferred Pharmacy (with phone number or street name):  [facility] - [place], [place] - [address]     Has the patient been seen for an appointment in the last year OR does the patient have an upcoming appointment? Yes.    Agent: Please be advised that RX refills may take up to 3 business days. We ask that you follow-up with your pharmacy.

## 2022-12-16 ENCOUNTER — Other Ambulatory Visit: Payer: Self-pay | Admitting: Family Medicine

## 2022-12-16 DIAGNOSIS — N3281 Overactive bladder: Secondary | ICD-10-CM

## 2022-12-16 NOTE — Telephone Encounter (Signed)
Medication Refill - Medication: oxybutynin (DITROPAN) 5 MG tablet [[id number]]   Has the patient contacted their pharmacy? Yes.   (Agent: If no, request that the patient contact the pharmacy for the refill. If patient does not wish to contact the pharmacy document the reason why and proceed with request.) (Agent: If yes, when and what did the pharmacy advise?)  Preferred Pharmacy (with phone number or street name):  [facility] ([place]) [place], [place] Phone: [phone]  Fax: [phone]     Has the patient been seen for an appointment in the last year OR does the patient have an upcoming appointment? Yes.    Agent: Please be advised that RX refills may take up to 3 business days. We ask that you follow-up with your pharmacy.

## 2022-12-17 MED ORDER — OXYBUTYNIN CHLORIDE 5 MG PO TABS: ORAL_TABLET | ORAL | 1 refills | Status: DC

## 2022-12-17 NOTE — Telephone Encounter (Signed)
Requested Prescriptions  Pending Prescriptions Disp Refills   oxybutynin (DITROPAN) 5 MG tablet 270 tablet 1    Sig: TAKE 1 TABLET BY MOUTH EVERY 8 HOURS AS NEEDED FOR BLADDER SPASMS Ardean Larsen)     Urology:  Bladder Agents Passed - 12/16/2022 11:37 AM      Passed - Valid encounter within last 12 months    Recent Outpatient Visits           4 months ago Memory loss   [place], DO   5 months ago No-show for appointment   [place], DO   6 months ago Insomnia, unspecified type   [place], DO   9 months ago Finger pain, right   [place], DO   10 months ago Finger pain, right   [place], [place]

## 2022-12-31 ENCOUNTER — Ambulatory Visit: Payer: Self-pay

## 2022-12-31 DIAGNOSIS — G2581 Restless legs syndrome: Secondary | ICD-10-CM

## 2022-12-31 DIAGNOSIS — G609 Hereditary and idiopathic neuropathy, unspecified: Secondary | ICD-10-CM

## 2022-12-31 MED ORDER — GABAPENTIN 600 MG PO TABS: 1200.0000 mg | ORAL_TABLET | Freq: Two times a day (BID) | ORAL | 3 refills | Status: DC

## 2022-12-31 NOTE — Telephone Encounter (Signed)
Patient called, left VM to return the call to the office to discuss medication with a nurse.   Summary: Gabapentin dosage request to be upped   The patient called in requesting a change in dosage for her gabapentin (NEURONTIN) 600 MG tablet. She states she takes 2 at night that help but only one in the morning and it doesn't help. She has heaviness in her legs and she has restless leg. Please assist patient further as she uses  [employer] ([place], [place] Phone: [phone] Fax: [phone]   Please assist patient further.

## 2022-12-31 NOTE — Telephone Encounter (Signed)
Please notify patient  Sent rx Gabapentin 600mg  x 2 = 1200mg  twice a day, 4 pills per day = 360 for 90 day supply with refills sent to [facility]  Nobie Putnam, [facility] 12/31/2022, 2:10 PM

## 2022-12-31 NOTE — Telephone Encounter (Signed)
  Pt. Rrequests morning dose of Gabapentin be increased to 2 pills. Please advise pt. Has tingling , heaviness in left leg. Please advise pt. Uses [facility]  Answer Assessment - Initial Assessment Questions 1. NAME of MEDICINE: "What medicine(s) are you calling about?"     Gabapentin 2. QUESTION: "What is your question?" (e.g., double dose of medicine, side effect)     Wants to increase morning dose to 2 pills 3. PRESCRIBER: "Who prescribed the medicine?" Reason: if prescribed by specialist, call should be referred to that group.     Dr. Parks Ranger 4. SYMPTOMS: "Do you have any symptoms?" If Yes, ask: "What symptoms are you having?"  "How bad are the symptoms (e.g., mild, moderate, severe)     Tingling , leg feels heavy 5. PREGNANCY:  "Is there any chance that you are pregnant?" "When was your last menstrual period?"     nO  Protocols used: Medication Question Call-A-AH

## 2023-01-19 ENCOUNTER — Ambulatory Visit: Payer: Self-pay

## 2023-01-19 DIAGNOSIS — N3281 Overactive bladder: Secondary | ICD-10-CM

## 2023-01-19 NOTE — Telephone Encounter (Signed)
  Chief Complaint: Med dose "Not working." Symptoms: Incontinence. Frequency: Since lower dose of Oxybutynin Pertinent Negatives: Patient denies  Disposition: [] ED /[] Urgent Care (no appt availability in office) / [] Appointment(In office/virtual)/ []  [place] [facility]/ [] Home Care/ [] Refused Recommended Disposition /[] [place] Mobile Bus/ [x]  Follow-up with PCP Additional Notes: Pt states lower dose of med "Not working." States "Can't even leave my house." Reports incontinence, bladder pressure "Just before I urinate, then pressure goes away." . States has been taking 5.0mg  as directed Q 8 hrs. States "I've given it enough time and just not working, would like to go back to 5.5mg . States called pharmacy, "There is a 10mg  dose but I don't know if he'd want me on that." Clarified pt stated 5.5 mg dose was working. Please advise.   Reason for Disposition  [1] Caller has URGENT medicine question about med that PCP or specialist prescribed AND [2] triager unable to answer question  Answer Assessment - Initial Assessment Questions 1. NAME of MEDICINE: "What medicine(s) are you calling about?"     Oxybutynin 2. QUESTION: "What is your question?" (e.g., double dose of medicine, side effect)     Lower dose not working 3. PRESCRIBER: "Who prescribed the medicine?" Reason: if prescribed by specialist, call should be referred to that group.     PCP 4. SYMPTOMS: "Do you have any symptoms?" If Yes, ask: "What symptoms are you having?"  "How bad are the symptoms (e.g., mild, moderate, severe)     Incontinence  Protocols used: Medication Question Call-A-AH

## 2023-01-19 NOTE — Telephone Encounter (Signed)
Summary: Discuss medication dosage   Patient has been on oxybutynin (DITROPAN) 5 MG tablet since September. Patient says the current 5 MG dosage isn't working. She was originally on 5.5 MG dosage. Patient says she needs a higher dosage. Patient says she takes the medicine as directed and is seeing no results.     Called pt left message on machine to call back.

## 2023-01-19 NOTE — Telephone Encounter (Signed)
Can you please clarify the dose she is referencing?  There is no 5.5 mg dose that I am aware of.  This comes in 2.5mg , 5mg  and 10mg .  There is a 24 hour XL version but she is on the Oxybutynin immediate release version 5mg  instructed up to 3 times per day.  Can you clarify what she is requesting and I will try to order it?  Nobie Putnam, DO [facility] 01/19/2023, 1:33 PM

## 2023-01-20 NOTE — Telephone Encounter (Signed)
Paige House,  Sorry I did make a mistake with the dosing - I suggested 10mg  immediate release 3 times a day. But that is incorrect. 10mg  does not exist immediate release, it is only XL.  The 10mg  is XL 24 hour dose ONLY.  I tried calling her back after 6pm but did not reach her. I left a detailed message.  Sorry for confusion!  If you can try to communicate with her once more, so we get the order correct the first time and not have to submit multiple orders - if she is not pleased with the plan.  She used to take 5mg  XL and also she has taken 10mg  XL in the past.  Options are keep 5mg  3 times a day. Or switch back to XL release and order 10mg  dose.  Please let me know what she prefers, and I can order it to her mail order.  Also, as I have mentioned to her before, if she does at any point prefer to discuss further with  a Urologist, and consider other med options or treatments, that is fine as well. I can setup a referral.  Nobie Putnam, DO [facility] 01/20/2023, 6:39 PM

## 2023-01-21 ENCOUNTER — Telehealth: Payer: Self-pay | Admitting: Family Medicine

## 2023-01-21 MED ORDER — OXYBUTYNIN CHLORIDE ER 10 MG PO TB24: 10.0000 mg | ORAL_TABLET | Freq: Every day | ORAL | 3 refills | Status: DC

## 2023-01-21 NOTE — Telephone Encounter (Signed)
Pt called Dr. Raliegh Ip back and says she prefers to get the XL 10 MG to take 3x's a day of oxybutynin (DITROPAN) sent to [facility] ([place], [place]   Now she takes 5mg  3x's a day and its not working well enough

## 2023-01-21 NOTE — Addendum Note (Signed)
Addended by: Olin Hauser on: 01/21/2023 09:42 AM   Modules accepted: Orders

## 2023-01-21 NOTE — Telephone Encounter (Signed)
We are already aware and are working with her to get it worked out.

## 2023-01-21 NOTE — Telephone Encounter (Signed)
Ordered Oxybutynin XL 10mg  daily  Nobie Putnam, DO [facility] 01/21/2023, 9:42 AM

## 2023-02-02 ENCOUNTER — Other Ambulatory Visit: Payer: Self-pay | Admitting: Family Medicine

## 2023-02-02 DIAGNOSIS — R632 Polyphagia: Secondary | ICD-10-CM

## 2023-02-02 DIAGNOSIS — R6889 Other general symptoms and signs: Secondary | ICD-10-CM

## 2023-02-02 DIAGNOSIS — R413 Other amnesia: Secondary | ICD-10-CM

## 2023-02-02 DIAGNOSIS — R635 Abnormal weight gain: Secondary | ICD-10-CM

## 2023-02-03 NOTE — Telephone Encounter (Signed)
Requested medication (s) are due for refill today: Donepezil due  Requested medication (s) are on the active medication list: yes both meds    Last refill:  Donepezil  12/12/22  #90  0  refills   Bupropion  03/21/22  #90  3 refills  Future visit scheduled   no  Notes to clinic:Failed due to labs, please review. Thank you.  Requested Prescriptions  Pending Prescriptions Disp Refills   donepezil (ARICEPT) 5 MG tablet [Pharmacy Med Name: Donepezil HCl 5 MG Oral Tablet] 90 tablet 3    Sig: TAKE 1 TABLET BY MOUTH AT  BEDTIME     Neurology:  Alzheimer's Agents Failed - 02/02/2023  5:26 PM      Failed - Valid encounter within last 6 months    Recent Outpatient Visits           6 months ago Memory loss   [place], DO   6 months ago No-show for appointment   [facility] Olin [facility], DO   7 months ago Insomnia, unspecified type   [facility], DO   11 months ago Finger pain, right   [facility] [place], Devonne Doughty, DO   1 year ago Finger pain, right   [facility] [place], Devonne Doughty, DO               buPROPion (WELLBUTRIN XL) 150 MG 24 hr tablet [Pharmacy Med Name: buPROPion HCl ER (XL) 150 MG Oral Tablet Extended Release 24 Hour] 90 tablet 3    Sig: TAKE 1 TABLET BY MOUTH DAILY     Psychiatry: Antidepressants - bupropion Failed - 02/02/2023  5:26 PM      Failed - Cr in normal range and within 360 days    Creat  Date Value Ref Range Status  06/22/2020 1.17 (H) 0.60 - 0.88 mg/dL Final    Comment:    For patients >23 years of age, the reference limit for Creatinine is approximately 13% higher for people identified as African-American. .          Failed - AST in normal range and within 360 days    AST  Date Value Ref Range Status  06/22/2020 22 10 - 35 U/L Final    SGOT(AST)  Date Value Ref Range Status  04/19/2012 49 (H) 15 - 37 Unit/L Final         Failed - ALT in normal range and within 360 days    ALT  Date Value Ref Range Status  06/22/2020 14 6 - 29 U/L Final   SGPT (ALT)  Date Value Ref Range Status  04/19/2012 46 U/L Final    Comment:    12-78 NOTE: NEW REFERENCE RANGE 11/07/2011          Failed - Valid encounter within last 6 months    Recent Outpatient Visits           6 months ago Memory loss   [place], DO   6 months ago No-show for appointment   [place], DO   7 months ago Insomnia, unspecified type   [place], DO   11 months ago Finger pain, right   [facility] [place], Devonne Doughty,  DO   1 year ago Finger pain, right   [place], [place]              Passed - Completed PHQ-2 or PHQ-9 in the last 360 days      Passed - Last BP in normal range    BP Readings from Last 1 Encounters:  07/24/22 (!) 129/53

## 2023-02-18 ENCOUNTER — Other Ambulatory Visit: Payer: Self-pay | Admitting: Family Medicine

## 2023-02-18 DIAGNOSIS — K589 Irritable bowel syndrome without diarrhea: Secondary | ICD-10-CM

## 2023-02-18 NOTE — Telephone Encounter (Signed)
Medication Refill - Medication: diphenoxylate-atropine (LOMOTIL) 2.5-0.025 MG tablet  Patient states she has 8 tabs left   Has the patient contacted their pharmacy? No.    Preferred Pharmacy (with phone number or street name):   [facility] [place], [place] Phone: [phone]  Fax: [phone]      Has the patient been seen for an appointment in the last year OR does the patient have an upcoming appointment? Yes.    Agent: Please be advised that RX refills may take up to 3 business days. We ask that you follow-up with your pharmacy.

## 2023-02-18 NOTE — Telephone Encounter (Signed)
Requested medication (s) are due for refill today: yes  Requested medication (s) are on the active medication list: yes  Last refill:  11/11/22 #60/2  Future visit scheduled: no  Notes to clinic:  Unable to refill per protocol, cannot delegate.    Requested Prescriptions  Pending Prescriptions Disp Refills   diphenoxylate-atropine (LOMOTIL) 2.5-0.025 MG tablet 60 tablet 2    Sig: TAKE 1 OR 2 TABLETS BY MOUTH 4 TIMES DAILY AS NEEDED FOR DIARRHEA     Not Delegated - Gastroenterology:  Antidiarrheals Failed - 02/18/2023  5:22 PM      Failed - This refill cannot be delegated      Passed - Valid encounter within last 12 months    Recent Outpatient Visits           6 months ago Memory loss   [place], DO   7 months ago No-show for appointment   [facility], DO   8 months ago Insomnia, unspecified type   [place], DO   11 months ago Finger pain, right   [place], [place]   1 year ago Finger pain, right   [place], [place]

## 2023-02-19 MED ORDER — DIPHENOXYLATE-ATROPINE 2.5-0.025 MG PO TABS: ORAL_TABLET | ORAL | 2 refills | Status: DC

## 2023-03-26 ENCOUNTER — Other Ambulatory Visit: Payer: Self-pay | Admitting: Family Medicine

## 2023-03-26 DIAGNOSIS — G4700 Insomnia, unspecified: Secondary | ICD-10-CM

## 2023-03-27 NOTE — Telephone Encounter (Signed)
Courtesy refill. Called patient to schedule appt for medication refills. No answer, unable to leave message. VM box full. Last OV 07/24/22. Requested Prescriptions  Pending Prescriptions Disp Refills   mirtazapine (REMERON) 7.5 MG tablet [Pharmacy Med Name: MIRTAZAPINE  7.5MG   TAB] 30 tablet 0    Sig: TAKE 1 TABLET BY MOUTH AT  BEDTIME     Psychiatry: Antidepressants - mirtazapine Failed - 03/26/2023 10:10 PM      Failed - Valid encounter within last 6 months    Recent Outpatient Visits           8 months ago Memory loss   [facility] [place], Netta Neat, DO   8 months ago No-show for appointment   [facility] Smit[facility] Cords, DO   9 months ago Insomnia, unspecified type   [facility] Smit[facility] Cords, DO   1 year ago Finger pain, right   [facility] Smit[facility], DO   1 year ago Finger pain, right    [facility] [place], Netta Neat, DO              Passed - Completed PHQ-2 or PHQ-9 in the last 360 days

## 2023-03-27 NOTE — Telephone Encounter (Signed)
Called patient on [phone] to schedule appt for medication refills. No answer, unable to leave message VM box full.

## 2023-04-05 ENCOUNTER — Other Ambulatory Visit: Payer: Self-pay | Admitting: Family Medicine

## 2023-04-05 DIAGNOSIS — E782 Mixed hyperlipidemia: Secondary | ICD-10-CM

## 2023-04-06 NOTE — Telephone Encounter (Signed)
Requested medication (s) are due for refill today: yes  Requested medication (s) are on the active medication list: yes  Last refill:  07/28/22 #100/2  Future visit scheduled: no  Notes to clinic:  Unable to refill per protocol due to failed labs, no updated results.     Requested Prescriptions  Pending Prescriptions Disp Refills   pravastatin (PRAVACHOL) 40 MG tablet [Pharmacy Med Name: Pravastatin Sodium 40 MG Oral Tablet] 100 tablet 2    Sig: TAKE 1 TABLET BY MOUTH  DAILY     Cardiovascular:  Antilipid - Statins Failed - 04/05/2023 10:37 PM      Failed - Lipid Panel in normal range within the last 12 months    Cholesterol, Total  Date Value Ref Range Status  08/24/2015 244 (H) 100 - 199 mg/dL Final   Cholesterol  Date Value Ref Range Status  06/22/2020 187 <200 mg/dL Final   LDL Cholesterol (Calc)  Date Value Ref Range Status  06/22/2020 107 (H) mg/dL (calc) Final    Comment:    Reference range: <100 . Desirable range <100 mg/dL for primary prevention;   <70 mg/dL for patients with CHD or diabetic patients  with > or = 2 CHD risk factors. Marland Kitchen LDL-C is now calculated using the Martin-Hopkins  calculation, which is a validated novel method providing  better accuracy than the Friedewald equation in the  estimation of LDL-C.  Horald Pollen et al. Lenox Ahr. 5784;696(29): 2061-2068  (http://education.[facility].com/faq/FAQ164)    HDL  Date Value Ref Range Status  06/22/2020 56 > OR = 50 mg/dL Final  52/84/1324 56 >40 mg/dL Final    Comment:    According to ATP-III Guidelines, HDL-C >59 mg/dL is considered a negative risk factor for CHD.    Triglycerides  Date Value Ref Range Status  06/22/2020 128 <150 mg/dL Final         Passed - Patient is not pregnant      Passed - Valid encounter within last 12 months    Recent Outpatient Visits           8 months ago Memory loss   [facility] [place], Netta Neat, DO   9 months ago  No-show for appointment   [facility] Smit[facility] C[facility], DO   9 months ago Insomnia, unspecified type   [facility] Smitty Cords, DO   1 year ago Finger pain, right   [facility] Smitty Cords, DO   1 year ago Finger pain, right   [facility] [place], Netta Neat, [place]

## 2023-04-07 ENCOUNTER — Other Ambulatory Visit: Payer: Self-pay | Admitting: Family Medicine

## 2023-04-07 DIAGNOSIS — F419 Anxiety disorder, unspecified: Secondary | ICD-10-CM

## 2023-04-07 DIAGNOSIS — G4700 Insomnia, unspecified: Secondary | ICD-10-CM

## 2023-04-07 DIAGNOSIS — F5081 Binge eating disorder: Secondary | ICD-10-CM

## 2023-04-07 DIAGNOSIS — N1831 Chronic kidney disease, stage 3a: Secondary | ICD-10-CM

## 2023-04-07 DIAGNOSIS — K644 Residual hemorrhoidal skin tags: Secondary | ICD-10-CM

## 2023-04-07 NOTE — Telephone Encounter (Signed)
Requested medications are due for refill today.  Yes  Requested medications are on the active medications list.  Yes - all 5  Last refill. varied  Future visit scheduled.   no  Notes to clinic.  Please review for refill 3 meds are not delegated. Remeron was recently filled, pt is requesting 90 day supply.  Fluoxetine request is  little too soon.    Requested Prescriptions  Pending Prescriptions Disp Refills   PROCTOZONE-HC 2.5 % rectal cream [Pharmacy Med Name: PROCTOZONE HYDROCORTIZONE  2.5%  CRE] 30 g 0    Sig: PLACE 1 APPLICATION RECTALLY  TWICE DAILY FOR 7 TO 10 DAYS OR  UNTIL RESOLVED, FOR INFLAMED  HEMORRHOID     Off-Protocol Failed - 04/07/2023 11:32 AM      Failed - Medication not assigned to a protocol, review manually.      Passed - Valid encounter within last 12 months    Recent Outpatient Visits           8 months ago Memory loss   [facility] [place], Netta Neat, DO   9 months ago No-show for appointment   [facility]mit[facility], DO   9 months ago Insomnia, unspecified type   [facility] Smit[facility] Cords, DO   1 year ago Finger pain, right   [facility]mit[facility] Cords, DO   1 year ago Finger pain, right   [place] [facility] Smit[facility] Cords, DO             Not Delegated - Over the Counter: OTC 2 Failed - 04/07/2023 11:32 AM      Failed - This refill cannot be delegated      Passed - Valid encounter within last 12 months    Recent Outpatient Visits           8 months ago Memory loss   [facility] Smit[facility] Cords, DO   9 months ago No-show for appointment   [facility] Smit[facility] Cords, DO   9 months ago Insomnia, unspecified type   [facility] Smit[facility] Cords, DO   1  year ago Finger pain, right   [facility] Smit[facility] Cords, DO   1 year ago Finger pain, right   [facility] [place], Netta Neat, DO               FLUoxetine (PROZAC) 20 MG capsule [Pharmacy Med Name: FLUoxetine HCl 20 MG Oral Capsule] 90 capsule 3    Sig: TAKE 1 CAPSULE BY MOUTH ONCE  DAILY     Psychiatry:  Antidepressants - SSRI Failed - 04/07/2023 11:32 AM      Failed - Valid encounter within last 6 months    Recent Outpatient Visits           8 months ago Memory loss   [facility] [place], Netta Neat, DO   9 months ago No-show for appointment   [facility] Smitty Cords, DO   9 months ago Insomnia, unspecified type   [facility] Smitty Cords, DO   1 year ago Finger pain, right   [facility] [place], Netta Neat, DO   1  year ago Finger pain, right   [facility] [place], Netta Neat, DO              Passed - Completed PHQ-2 or PHQ-9 in the last 360 days       mirtazapine (REMERON) 7.5 MG tablet [Pharmacy Med Name: MIRTAZAPINE  7.5MG   TAB] 30 tablet 11    Sig: TAKE 1 TABLET BY MOUTH AT  BEDTIME     Psychiatry: Antidepressants - mirtazapine Failed - 04/07/2023 11:32 AM      Failed - Valid encounter within last 6 months    Recent Outpatient Visits           8 months ago Memory loss   [facility] [place], Netta Neat, DO   9 months ago No-show for appointment   [facility] Smitty Cords, DO   9 months ago Insomnia, unspecified type   [facility] Smitty Cords, DO   1 year ago Finger pain, right   [place] [facility] [place], Netta Neat, DO   1 year ago Finger pain, right   [facility] [place], Netta Neat, DO              Passed - Completed PHQ-2 or PHQ-9 in the last 360 days       mupirocin cream (BACTROBAN) 2 % [Pharmacy Med Name: MUPIROCIN    CRE 2%] 30 g     Sig: APPLY 1 APPLICATION TOPICALLY  TWICE DAILY     Off-Protocol Failed - 04/07/2023 11:32 AM      Failed - Medication not assigned to a protocol, review manually.      Passed - Valid encounter within last 12 months    Recent Outpatient Visits           8 months ago Memory loss   [facility] [place], Netta Neat, DO   9 months ago No-show for appointment   [facility] Smitty Cords, DO   9 months ago Insomnia, unspecified type   [facility] Smitty Cords, DO   1 year ago Finger pain, right    [facility] [place], Netta Neat, DO   1 year ago Finger pain, right    [facility], Netta Neat, DO               LORazepam (ATIVAN) 0.5 MG tablet [Pharmacy Med Name: LORazepam 0.5 MG Oral Tablet] 30 tablet     Sig: TAKE 1 TABLET BY MOUTH TWICE DAILY AS NEEDED FOR ANXIETY OR SLEEP     Not Delegated - Psychiatry: Anxiolytics/Hypnotics 2 Failed - 04/07/2023 11:32 AM      Failed - This refill cannot be delegated      Failed - Urine Drug Screen completed in last 360 days      Failed - Valid encounter within last 6 months    Recent Outpatient Visits           8 months ago Memory loss   [facility] Smitty Cords, DO   9 months ago No-show for appointment   [facility] Smit[facility] Cords, DO   9 months ago Insomnia, unspecified type   [facility] [place], Netta Neat, [place]  1 year ago Finger pain, right   [place] [place], Netta Neat, DO   1 year ago Finger  pain, right   [place] [facility] [place], Netta Neat, [place]              [id number] - Patient is not pregnant       lisinopril (ZESTRIL) 5 MG tablet [Pharmacy Med Name: Lisinopril 5 MG Oral Tablet] 100 tablet 2    Sig: TAKE 1 TABLET BY MOUTH  DAILY     Cardiovascular:  ACE Inhibitors Failed - 04/07/2023 11:32 AM      Failed - Cr in normal range and within 180 days    Creat  Date Value Ref Range Status  06/22/2020 1.17 (H) 0.60 - 0.88 mg/dL Final    Comment:    For patients >48 years of age, the reference limit for Creatinine is approximately 13% higher for people identified as African-American. .          Failed - K in normal range and within 180 days    Potassium  Date Value Ref Range Status  06/22/2020 4.6 3.5 - 5.3 mmol/L Final  04/19/2012 3.9 3.5 - 5.1 mmol/L Final         Failed - Valid encounter within last 6 months    Recent Outpatient Visits           8 months ago Memory loss   [place] [facility] [place], Netta Neat, DO   9 months ago No-show for appointment   [facility] Smit[facility] C[facility], DO   9 months ago Insomnia, unspecified type   [facility] Smit[facility] C[facility], DO   1 year ago Finger pain, right   [place] [facility] Smitty Cords, DO   1 year ago Finger pain, right   [place] [facility] Smitty Cords, [place] - Patient is not pregnant      Passed - Last BP in normal range    BP Readings from Last 1 Encounters:  07/24/22 (!) 129/53

## 2023-04-20 ENCOUNTER — Other Ambulatory Visit: Payer: Self-pay | Admitting: Family Medicine

## 2023-04-20 DIAGNOSIS — H8111 Benign paroxysmal vertigo, right ear: Secondary | ICD-10-CM

## 2023-04-21 NOTE — Telephone Encounter (Signed)
Requested medication (s) are due for refill today: yes  Requested medication (s) are on the active medication list: yes  Last refill:  03/03/22  Future visit scheduled: no  Notes to clinic:  Unable to refill per protocol, cannot delegate.      Requested Prescriptions  Pending Prescriptions Disp Refills   meclizine (ANTIVERT) 25 MG tablet [Pharmacy Med Name: MECLIZINE  25MG   TAB] 80 tablet     Sig: TAKE 1 TABLET BY MOUTH DAILY AS  NEEDED FOR DIZZINESS     Not Delegated - Gastroenterology: Antiemetics Failed - 04/20/2023 10:40 AM      Failed - This refill cannot be delegated      Failed - Valid encounter within last 6 months    Recent Outpatient Visits           9 months ago Memory loss   [facility] [place], Netta Neat, DO   9 months ago No-show for appointment   [facility] Smit[facility] Cords, DO   10 months ago Insomnia, unspecified type   [facility] Smitty Cords, DO   1 year ago Finger pain, right   [place] Smitty Cords, DO   1 year ago Finger pain, right   [place] [place], Netta Neat, [place]

## 2023-05-13 ENCOUNTER — Telehealth: Payer: Self-pay | Admitting: Family Medicine

## 2023-06-29 ENCOUNTER — Telehealth: Payer: Self-pay | Admitting: Family Medicine

## 2023-06-29 NOTE — Telephone Encounter (Signed)
erythromycin ophthalmic ointment [[id number]] not on current list, routing for approval.

## 2023-06-29 NOTE — Telephone Encounter (Signed)
Refill denied.  I do not see where he has been seen within the last few years regarding an eye complaint.

## 2023-06-29 NOTE — Telephone Encounter (Signed)
Medication Refill - Medication: erythromycin ophthalmic ointment [[id number]]   Has the patient contacted their pharmacy? Yes.   (Agent: If no, request that the patient contact the pharmacy for the refill. If patient does not wish to contact the pharmacy document the reason why and proceed with request.) (Agent: If yes, when and what did the pharmacy advise?)  Preferred Pharmacy (with phone number or street name):  [facility] [place] - [address] Phone: [phone]  Fax: [phone]     Has the patient been seen for an appointment in the last year OR does the patient have an upcoming appointment?  Yes.   Agent: Please be advised that RX refills may take up to 3 business days. We ask that you follow-up with your pharmacy.

## 2023-07-02 ENCOUNTER — Other Ambulatory Visit: Payer: Self-pay | Admitting: Family Medicine

## 2023-07-02 DIAGNOSIS — H109 Unspecified conjunctivitis: Secondary | ICD-10-CM

## 2023-07-02 MED ORDER — ERYTHROMYCIN 5 MG/GM OP OINT
1.0000 | TOPICAL_OINTMENT | Freq: Four times a day (QID) | OPHTHALMIC | 3 refills | Status: DC
Start: 2023-07-02 — End: 2024-01-29

## 2023-07-30 ENCOUNTER — Ambulatory Visit: Payer: Medicare Other | Admitting: Family Medicine

## 2023-08-13 ENCOUNTER — Ambulatory Visit: Payer: Self-pay

## 2023-08-13 NOTE — Telephone Encounter (Signed)
Summary: medication request   Patient called in stated she has conjunctivitis and request provider call in eye drops for her. Please f/u with patient      Called pt - left message on machine to return call.

## 2023-08-13 NOTE — Telephone Encounter (Signed)
Summary: medication request   Patient called in stated she has conjunctivitis and request provider call in eye drops for her. Please f/u with patient    Called pt - left message on machine to return our call. WE have tried to contact pt 3 times without success. I will forward encounter to the office for follow up.

## 2023-08-13 NOTE — Telephone Encounter (Signed)
Second attempt to call patient- no answer- message left to call office

## 2023-08-14 ENCOUNTER — Telehealth: Payer: Self-pay

## 2023-08-14 NOTE — Telephone Encounter (Signed)
Copied from CRM [id number]. Topic: General - Inquiry >> Aug 14, 2023 12:58 PM Marlow Baars wrote: Reason for CRM: The patient called back in checking on the status of her eye drops for her conjuctivitis. A message was sent yesterday from Nurse Triage to the patients provider. Please assist patient further.

## 2023-08-14 NOTE — Telephone Encounter (Signed)
Advised pt she would need to be seen for this before eye drops could be sent in.  Pt states that Dr. Althea Charon has sent this in before without being seen.  She request to wait until he returns on 09/03 to review.   Thanks,   -Vernona Rieger

## 2023-08-14 NOTE — Telephone Encounter (Signed)
This was just filled on 7/18 for 1 tube with 3 refills.  Has she used all 4 tubes of the eye ointment?

## 2023-08-29 ENCOUNTER — Other Ambulatory Visit: Payer: Self-pay | Admitting: Family Medicine

## 2023-08-29 DIAGNOSIS — N3281 Overactive bladder: Secondary | ICD-10-CM

## 2023-09-02 ENCOUNTER — Ambulatory Visit: Payer: Medicare Other | Admitting: Family Medicine

## 2023-09-08 IMAGING — DX DG FINGER MIDDLE 2+V*R*
3 series · 3 of 3 positions shown · non-contrast
Comparison: None

CLINICAL DATA: w a female at age 88 presents for evaluation of
middle finger injury that occurred greater than 1 month ago by
report.

EXAM:
RIGHT MIDDLE FINGER 2+V

[finger ap]
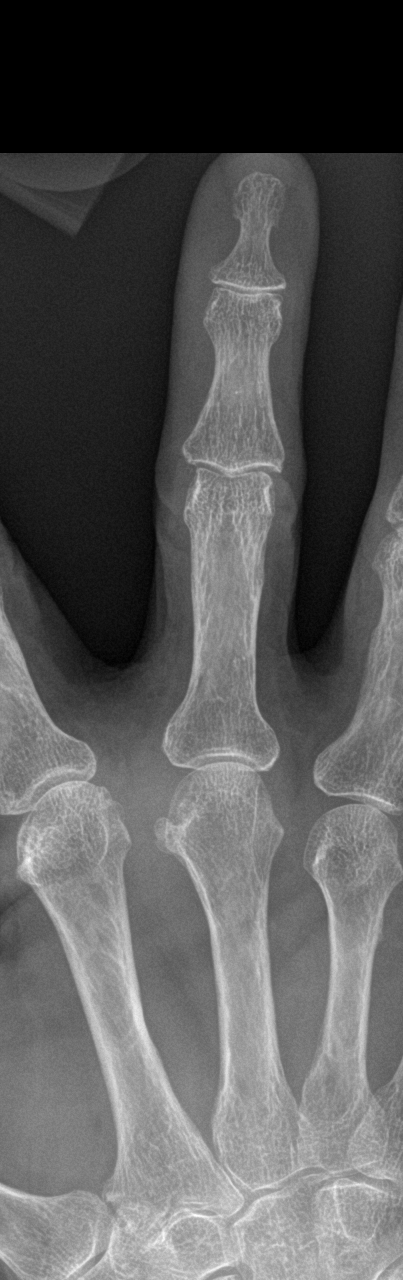

[finger obl]
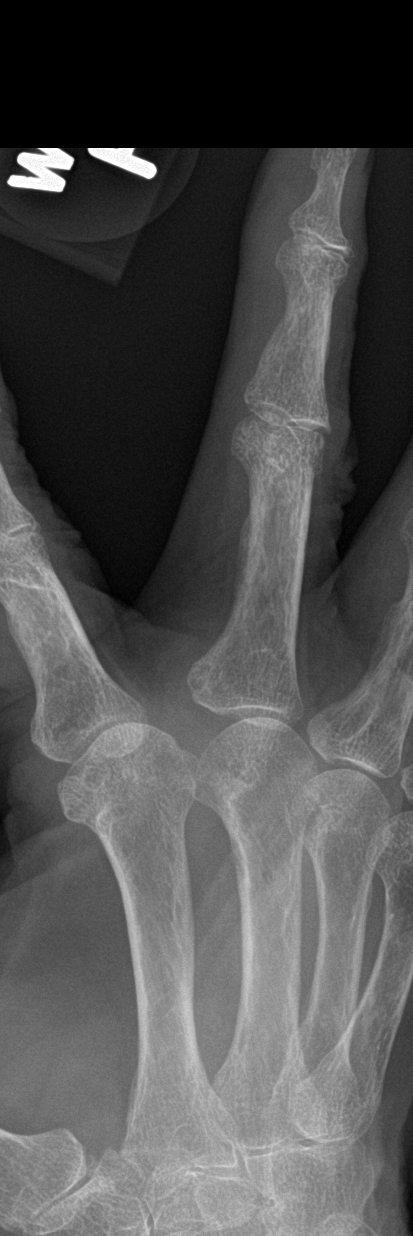

[finger lat]
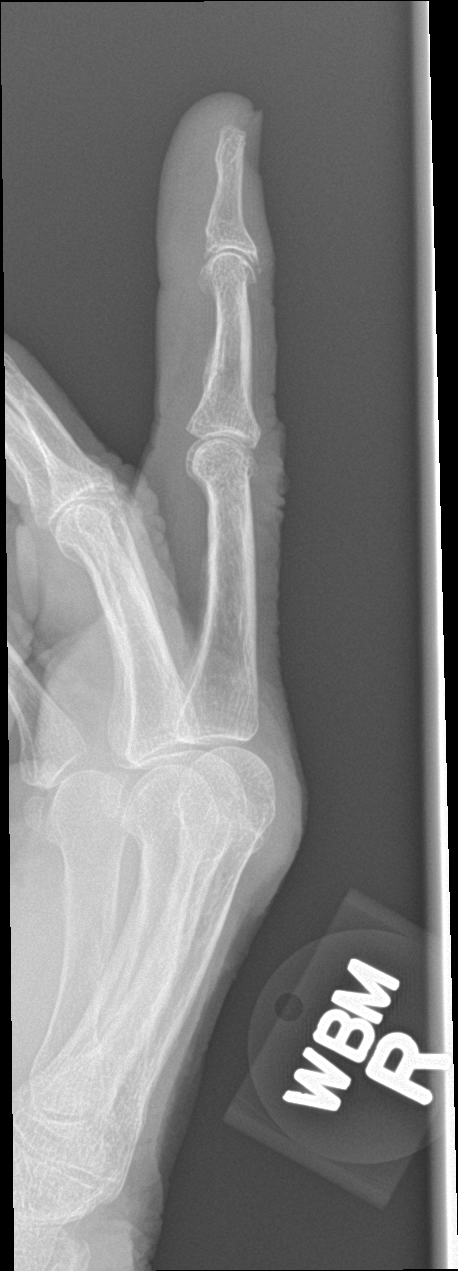

[3 of 3 positions shown; findings below may reference images not displayed]

FINDINGS: Degenerative changes about the distal interphalangeal joint. No
signs of fracture or dislocation. Perhaps mild soft tissue swelling
about the finger.
IMPRESSION: Question mild soft tissue swelling with signs of degenerative
changes in the distal interphalangeal joint. No sign of acute
fracture or dislocation.

## 2023-09-13 ENCOUNTER — Other Ambulatory Visit: Payer: Self-pay | Admitting: Family Medicine

## 2023-09-13 DIAGNOSIS — K589 Irritable bowel syndrome without diarrhea: Secondary | ICD-10-CM

## 2023-09-14 ENCOUNTER — Other Ambulatory Visit: Payer: Self-pay | Admitting: Family Medicine

## 2023-09-14 DIAGNOSIS — N3281 Overactive bladder: Secondary | ICD-10-CM

## 2023-09-14 DIAGNOSIS — K589 Irritable bowel syndrome without diarrhea: Secondary | ICD-10-CM

## 2023-09-14 NOTE — Telephone Encounter (Signed)
Pt is calling in checking on the status of her refill for diphenoxylate-atropine (LOMOTIL) . Pt says she missed her last appointment because of a family emergency but she is in need of the medication. Pt says she has enough tablets to last for today but will need more.

## 2023-09-14 NOTE — Telephone Encounter (Signed)
Requested medication (s) are due for refill today - yes  Requested medication (s) are on the active medication list -yes  Future visit scheduled -no  Last refill: 02/19/23 #60 2RF  Notes to clinic: non delegated Rx  Requested Prescriptions  Pending Prescriptions Disp Refills   diphenoxylate-atropine (LOMOTIL) 2.5-0.025 MG tablet [Pharmacy Med Name: DIPHEN/ATROP TAB 2.5/.025MG ] 60 tablet     Sig: TAKE 1 OR 2 TABLETS BY MOUTH 4 TIMES DAILY AS NEEDED FOR DIARRHEA     Not Delegated - Gastroenterology:  Antidiarrheals Failed - 09/13/2023  3:19 PM      Failed - This refill cannot be delegated      Failed - Valid encounter within last 12 months    Recent Outpatient Visits           1 year ago Memory loss   [place] [facility] [place], Netta Neat, DO   1 year ago No-show for appointment   [place] [facility] Smitty Cords, DO   1 year ago Insomnia, unspecified type   [facility] Smitty Cords, DO   1 year ago Finger pain, right   [place] [facility] Smitty Cords, DO   1 year ago Finger pain, right   [place] [facility] [place], Netta Neat, DO                 Requested Prescriptions  Pending Prescriptions Disp Refills   diphenoxylate-atropine (LOMOTIL) 2.5-0.025 MG tablet [Pharmacy Med Name: DIPHEN/ATROP TAB 2.5/.025MG ] 60 tablet     Sig: TAKE 1 OR 2 TABLETS BY MOUTH 4 TIMES DAILY AS NEEDED FOR DIARRHEA     Not Delegated - Gastroenterology:  Antidiarrheals Failed - 09/13/2023  3:19 PM      Failed - This refill cannot be delegated      Failed - Valid encounter within last 12 months    Recent Outpatient Visits           1 year ago Memory loss   [facility] [place], Netta Neat, DO   1 year ago No-show for appointment   [facility] Smitty Cords, DO   1  year ago Insomnia, unspecified type   [facility] [facility] Smitty Cords, DO   1 year ago Finger pain, right   [facility] Smitty Cords, DO   1 year ago Finger pain, right   [facility] [place], Netta Neat, [place]

## 2023-09-15 NOTE — Telephone Encounter (Signed)
Requested medications are due for refill today.  no  Requested medications are on the active medications list.  yes  Last refill. 09/14/2023 #60 3 rf  Future visit scheduled.   no  Notes to clinic.  Refill/refusal not delegated.    Requested Prescriptions  Pending Prescriptions Disp Refills   diphenoxylate-atropine (LOMOTIL) 2.5-0.025 MG tablet 60 tablet 2    Sig: TAKE 1 OR 2 TABLETS BY MOUTH 4 TIMES DAILY AS NEEDED FOR DIARRHEA     Not Delegated - Gastroenterology:  Antidiarrheals Failed - 09/14/2023  1:14 PM      Failed - This refill cannot be delegated      Failed - Valid encounter within last 12 months    Recent Outpatient Visits           1 year ago Memory loss   [facility] [place], Netta Neat, DO   1 year ago No-show for appointment   [facility] Smitty Cords, DO   1 year ago Insomnia, unspecified type   [facility] Smitty Cords, DO   1 year ago Finger pain, right   [facility] Smitty Cords, DO   1 year ago Finger pain, right   [facility] [place], Netta Neat, [place]

## 2023-09-28 MED ORDER — OXYBUTYNIN CHLORIDE 5 MG PO TABS
5.0000 mg | ORAL_TABLET | Freq: Three times a day (TID) | ORAL | 3 refills | Status: DC | PRN
Start: 2023-09-28 — End: 2024-08-03

## 2023-09-28 NOTE — Addendum Note (Signed)
Addended by: Smitty Cords on: 09/28/2023 01:25 PM   Modules accepted: Orders

## 2023-09-28 NOTE — Telephone Encounter (Signed)
Switched to oxybutynin 5mg  THREE TIMES A DAY AS NEEDED 90 day to mail order  Saralyn Pilar, DO [facility] 09/28/2023, 1:25 PM

## 2023-09-28 NOTE — Telephone Encounter (Signed)
Copied from CRM [id number]. Topic: General - Other >> Sep 28, 2023 12:10 PM Runell Gess P wrote: Reason for CRM: pt called saying the medication she take doe bladder control is not supposed to be 24 hr but the 8 hr.  It is the Oxbutynin  that was sent in on 9/16.  She ask that the 8 hr.5 mg be sent to her. [place] Recent Vital Signs  @VS @   Past Medical History: No date: Anxiety No date: Hyperlipidemia No date: IBS (irritable bowel syndrome)   Expected Discharge Date @FLOW (914782::9)@  Diet Order    Patient Encounter Information Not Found      VTE Documentation @FLOW (5621308::6)@   Work Intensity Score/Level of Care @FLOW (10536::1)@  @LEVELOFCARE @   Mobility @FLOW (7060220::1)@  Consult Orders    Patient Encounter Information Not Found      Significant Events   DC Barriers  Abnormal Labs:  Otis Brace 09/28/23, 12:12 PM   mg

## 2023-10-28 ENCOUNTER — Other Ambulatory Visit: Payer: Self-pay | Admitting: Family Medicine

## 2023-10-28 DIAGNOSIS — G2581 Restless legs syndrome: Secondary | ICD-10-CM

## 2023-10-28 DIAGNOSIS — G609 Hereditary and idiopathic neuropathy, unspecified: Secondary | ICD-10-CM

## 2023-10-29 NOTE — Telephone Encounter (Signed)
Requested Prescriptions  Refused Prescriptions Disp Refills   gabapentin (NEURONTIN) 600 MG tablet [Pharmacy Med Name: Gabapentin 600 MG Oral Tablet] 400 tablet 2    Sig: TAKE 2 TABLETS BY MOUTH TWICE  DAILY     Neurology: Anticonvulsants - gabapentin Failed - 10/28/2023  5:38 AM      Failed - Cr in normal range and within 360 days    Creat  Date Value Ref Range Status  06/22/2020 1.17 (H) 0.60 - 0.88 mg/dL Final    Comment:    For patients >87 years of age, the reference limit for Creatinine is approximately 13% higher for people identified as African-American. .          Failed - Completed PHQ-2 or PHQ-9 in the last 360 days      Failed - Valid encounter within last 12 months    Recent Outpatient Visits           1 year ago Memory loss   [facility], Netta Neat, DO   1 year ago No-show for appointment   [facility] Smitty Cords, DO   1 year ago Insomnia, unspecified type   [facility] Smitty Cords, DO   1 year ago Finger pain, right   [facility] Smitty Cords, DO   1 year ago Finger pain, right   [facility] [place], Netta Neat, [place]

## 2023-11-09 ENCOUNTER — Ambulatory Visit: Payer: Self-pay | Admitting: *Deleted

## 2023-11-09 NOTE — Telephone Encounter (Signed)
Reason for Disposition  [1] MILD pain (e.g., does not interfere with normal activities) AND [2] present > 7 days  Answer Assessment - Initial Assessment Questions 1. 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   I use gabapentin.   It helps this.  Usually this syndrome does not bother me.   The last week or so it has been bothered.    I think it may be reevaluted. 2. LOCATION: "Where is the pain located?"      Left leg     I also tingling and neuropathy.   Recently I am having heavy leg syndrome.   It went away and now it's back.    3. PAIN: "How bad is the pain?"    (Scale 1-10; or mild, moderate, severe)   -  MILD (1-3): doesn't interfere with normal activities    -  MODERATE (4-7): interferes with normal activities (e.g., work or school) or awakens from sleep, limping    -  SEVERE (8-10): excruciating pain, unable to do any normal activities, unable to walk     Moderate 4. WORK OR EXERCISE: "Has there been any recent work or exercise that involved this part of the body?"      No 5. CAUSE: "What do you think is causing the leg pain?"     Heavy Leg Syndrome 6. OTHER SYMPTOMS: "Do you have any other symptoms?" (e.g., chest pain, back pain, breathing difficulty, swelling, rash, fever, numbness, weakness)     Heavy Leg Syndrome 7. PREGNANCY: "Is there any chance you are pregnant?" "When was your last menstrual period?"     Not asked  Protocols used: Leg Pain-A-AH

## 2023-11-09 NOTE — Telephone Encounter (Signed)
  Chief Complaint: Heavy Leg Syndrome in left leg Symptoms: Gabapentin not helping the tingling and neuropathy as well.   Feels like Heavy Leg Syndrome again. Frequency: Over the last few days Pertinent Negatives: Patient denies injuries.  Disposition: [] ED /[] Urgent Care (no appt availability in office) / [x] Appointment(In office/virtual)/ []  [place] [facility]/ [] Home Care/ [] Refused Recommended Disposition /[] [place] [facility]/ []  Follow-up with PCP Additional Notes: Pt requesting an appt for after Thanksgiving.   Appt made for 11/24/2023 with Dr. Althea Charon for 3:20.

## 2023-11-24 ENCOUNTER — Ambulatory Visit: Payer: Medicare Other | Admitting: Family Medicine

## 2023-11-24 ENCOUNTER — Encounter: Payer: Self-pay | Admitting: Family Medicine

## 2023-11-24 VITALS — BP 134/76 | HR 58 | Ht 60.0 in | Wt 145.0 lb

## 2023-11-24 DIAGNOSIS — G2581 Restless legs syndrome: Secondary | ICD-10-CM

## 2023-11-24 DIAGNOSIS — Z23 Encounter for immunization: Secondary | ICD-10-CM | POA: Diagnosis not present

## 2023-11-24 DIAGNOSIS — G609 Hereditary and idiopathic neuropathy, unspecified: Secondary | ICD-10-CM | POA: Diagnosis not present

## 2023-11-24 MED ORDER — GABAPENTIN 600 MG PO TABS
1200.0000 mg | ORAL_TABLET | Freq: Two times a day (BID) | ORAL | 3 refills | Status: AC
Start: 2023-11-24 — End: ?

## 2023-11-24 NOTE — Patient Instructions (Addendum)
Thank you for coming to the office today.  Discontinue Aricept for now. Since we believe it can be causing some of the muscle cramp pain symptoms and insomnia, keeping you awake.  See you how do off of the medicine for the next 4-6 weeks. If you prefer to try the OTHER version - Namenda also for cognitive memory, I can order that if you like.  Refilled Gabapentin  Please check with [facility] for filling the others and they can send [place] a report.  Please schedule a Follow-up Appointment to: Return if symptoms worsen or fail to improve.  If you have any other questions or concerns, please feel free to call the office or send a message through MyChart. You may also schedule an earlier appointment if necessary.  Additionally, you may be receiving a survey about your experience at our office within a few days to 1 week by e-mail or mail. We value your feedback.  Saralyn Pilar, DO [facility], [place]

## 2023-11-24 NOTE — Progress Notes (Signed)
Subjective:    Patient ID: Paige House, female    DOB: 12/20/1933, 89 y.o.   MRN: [id number]  Paige House is a 89 y.o. female presenting on 11/24/2023 for Acute Visit (Left leg feels heavy every night she has difficulty leg her leg.)   HPI  Discussed the use of AI scribe software for clinical note transcription with the patient, who gave verbal consent to proceed.  The patient, with a history of cognitive decline and neuropathy, presents with a chief complaint of "heavy leg syndrome" at night. She describes a sensation of tingling in the foot, which improves with walking. However, during the night, she experiences difficulty lifting her leg, necessitating sitting up, massaging the leg, and subsequently leading to insomnia. Despite adherence to gabapentin therapy, the patient reports that the symptoms have persisted and even worsened over the past three weeks, disrupting her sleep and daily routine.  The patient also expresses concerns about her cognitive function, noting recent instances of forgetfulness. She reports taking Aricept for cognitive support but is dissatisfied with the perceived slow progress and recent memory lapses. She engages in regular cognitive activities, including reading and playing games, but has noticed an increase in forgetfulness, particularly with short-term tasks. - She is concerned Aricept may be causing her side effects w/ muscle cramp in leg and insomnia worsening  The patient lives alone and manages her medications independently. She is currently on gabapentin and Aricept, and has not introduced any new medications recently. She expresses a desire to explore other treatment options for her cognitive decline and neuropathy symptoms.           11/24/2023    3:48 PM 06/20/2022    6:38 PM 02/25/2022    2:30 PM  Depression screen PHQ 2/9  Decreased Interest 1 1 1   Down, Depressed, Hopeless 1 1 1   PHQ - 2 Score 2 2 2   Altered sleeping 0 3 3   Tired, decreased energy 1 3 1   Change in appetite 1  3  Feeling bad or failure about yourself  0 2 1  Trouble concentrating 0 2 1  Moving slowly or fidgety/restless 0 0 0  Suicidal thoughts 0 0 0  PHQ-9 Score 4 12 11   Difficult doing work/chores  Very difficult Not difficult at all       11/24/2023    3:49 PM 02/25/2022    2:30 PM 07/16/2021    2:14 PM 02/29/2020    2:47 PM  GAD 7 : Generalized Anxiety Score  Nervous, Anxious, on Edge 1 2 2  0  Control/stop worrying 1 3 3  0  Worry too much - different things 0 3 3 0  Trouble relaxing 1 3 3  0  Restless 1 3 3  0  Easily annoyed or irritable 1 3 3  0  Afraid - awful might happen 1 3 3  0  Total GAD 7 Score 6 20 20  0  Anxiety Difficulty  Somewhat difficult Very difficult Not difficult at all    Social History   Tobacco Use  . Smoking status: Former    Types: Cigarettes    Start date: 07/26/1985  . Smokeless tobacco: Never  Vaping Use  . Vaping status: Never Used  Substance Use Topics  . Alcohol use: Not Currently    Alcohol/week: 0.0 standard drinks of alcohol  . Drug use: No    Review of Systems Per HPI unless specifically indicated above     Objective:    BP 134/76   Pulse Marland Kitchen)  58   Ht 5' (1.524 m)   Wt 145 lb (65.8 kg)   BMI 28.32 kg/m   Wt Readings from Last 3 Encounters:  11/24/23 145 lb (65.8 kg)  07/24/22 151 lb (68.5 kg)  07/09/22 149 lb (67.6 kg)    Physical Exam Vitals and nursing note reviewed.  Constitutional:      General: She is not in acute distress.    Appearance: Normal appearance. She is well-developed. She is not diaphoretic.     Comments: Well-appearing, comfortable, cooperative  HENT:     Head: Normocephalic and atraumatic.  Eyes:     General:        Right eye: No discharge.        Left eye: No discharge.     Conjunctiva/sclera: Conjunctivae normal.  Cardiovascular:     Rate and Rhythm: Normal rate.  Pulmonary:     Effort: Pulmonary effort is normal.  Skin:    General: Skin is  warm and dry.     Findings: No erythema or rash.  Neurological:     Mental Status: She is alert and oriented to person, place, and time.  Psychiatric:        Mood and Affect: Mood normal.        Behavior: Behavior normal.        Thought Content: Thought content normal.     Comments: Well groomed, good eye contact, normal speech and thoughts    Results for orders placed or performed in visit on 06/22/20  Hemoglobin A1c  Result Value Ref Range   Hgb A1c MFr Bld 5.4 <5.7 % of total Hgb   Mean Plasma Glucose 108 (calc)   eAG (mmol/L) 6.0 (calc)  CBC with Differential/Platelet  Result Value Ref Range   WBC 5.2 3.8 - 10.8 Thousand/uL   RBC 4.60 3.80 - 5.10 Million/uL   Hemoglobin 13.4 11.7 - 15.5 g/dL   HCT 91.4 78.2 - 95.6 %   MCV 89.3 80.0 - 100.0 fL   MCH 29.1 27.0 - 33.0 pg   MCHC 32.6 32.0 - 36.0 g/dL   RDW 21.3 08.6 - 57.8 %   Platelets 163 140 - 400 Thousand/uL   MPV 11.3 7.5 - 12.5 fL   Neutro Abs 3,203 1,500 - 7,800 cells/uL   Lymphs Abs 1,451 850 - 3,900 cells/uL   Absolute Monocytes 348 200 - 950 cells/uL   Eosinophils Absolute 140 15 - 500 cells/uL   Basophils Absolute 57 0 - 200 cells/uL   Neutrophils Relative % 61.6 %   Total Lymphocyte 27.9 %   Monocytes Relative 6.7 %   Eosinophils Relative 2.7 %   Basophils Relative 1.1 %  COMPLETE METABOLIC PANEL WITH GFR  Result Value Ref Range   Glucose, Bld 95 65 - 99 mg/dL   BUN 20 7 - 25 mg/dL   Creat 4.69 (H) 6.29 - 0.88 mg/dL   GFR, Est Non African American 43 (L) > OR = 60 mL/min/1.62m2   GFR, Est African American 50 (L) > OR = 60 mL/min/1.103m2   BUN/Creatinine Ratio 17 6 - 22 (calc)   Sodium 140 135 - 146 mmol/L   Potassium 4.6 3.5 - 5.3 mmol/L   Chloride 104 98 - 110 mmol/L   CO2 29 20 - 32 mmol/L   Calcium 9.4 8.6 - 10.4 mg/dL   Total Protein 6.8 6.1 - 8.1 g/dL   Albumin 4.6 3.6 - 5.1 g/dL   Globulin 2.2 1.9 - 3.7 g/dL (calc)   AG  Ratio 2.1 1.0 - 2.5 (calc)   Total Bilirubin 1.1 0.2 - 1.2 mg/dL    Alkaline phosphatase (APISO) 60 37 - 153 U/L   AST 22 10 - 35 U/L   ALT 14 6 - 29 U/L  Lipid panel  Result Value Ref Range   Cholesterol 187 <200 mg/dL   HDL 56 > OR = 50 mg/dL   Triglycerides 295 <621 mg/dL   LDL Cholesterol (Calc) 107 (H) mg/dL (calc)   Total CHOL/HDL Ratio 3.3 <5.0 (calc)   Non-HDL Cholesterol (Calc) 131 (H) <130 mg/dL (calc)  TSH  Result Value Ref Range   TSH 0.61 0.40 - 4.50 mIU/L  Vitamin B12  Result Value Ref Range   Vitamin B-12 490 200 - 1,100 pg/mL  VITAMIN D 25 Hydroxy (Vit-D Deficiency, Fractures)  Result Value Ref Range   Vit D, 25-Hydroxy 27 (L) 30 - 100 ng/mL      Assessment & Plan:   Problem List Items Addressed This Visit     Neuropathy, peripheral   Relevant Medications   gabapentin (NEURONTIN) 600 MG tablet   Restless leg syndrome   Relevant Medications   gabapentin (NEURONTIN) 600 MG tablet   Other Visit Diagnoses     Flu vaccine need    -  Primary   Relevant Orders   Flu Vaccine Trivalent High Dose (Fluad) (Completed)        Lower Extremity Neuropathy   Persistent nocturnal symptoms despite Gabapentin therapy. Tingling sensation in foot during the day, which improves with ambulation. Severe nocturnal symptoms causing sleep disturbance.   -Continue Gabapentin 600mg  x2 = 1200mg  twice daily.   -Discontinue Donepezil (Aricept) due to potential contribution to muscle cramps and insomnia.   Seems to have been ineffective.  Cognitive Decline   Patient reports forgetfulness and concerns about cognitive decline. Currently on Donepezil (Aricept), but patient reports dissatisfaction with the medication's efficacy and potential side effects.   -Discontinue Donepezil (Aricept) due to potential side effects and patient dissatisfaction.   -Consider trial of Memantine (Namenda) after a washout period if patient desires further pharmacological intervention for cognitive decline.    Medication Refills   Patient requests refills of all  medications before the end of the year due to changes in insurance coverage.   -Refill Gabapentin prescription.   -Advise patient to contact [facility] for refills of other medications, as she appears to have refills available until April.         Orders Placed This Encounter  Procedures  . Flu Vaccine Trivalent High Dose (Fluad)    Meds ordered this encounter  Medications  . gabapentin (NEURONTIN) 600 MG tablet    Sig: Take 2 tablets (1,200 mg total) by mouth 2 (two) times daily.    Dispense:  360 tablet    Refill:  3    Expedited    Follow up plan: Return if symptoms worsen or fail to improve.    Saralyn Pilar, DO [facility] 11/24/2023, 4:03 PM

## 2023-12-22 ENCOUNTER — Telehealth: Payer: Self-pay

## 2023-12-22 DIAGNOSIS — R413 Other amnesia: Secondary | ICD-10-CM

## 2023-12-22 MED ORDER — MEMANTINE HCL 5 MG PO TABS
5.0000 mg | ORAL_TABLET | Freq: Two times a day (BID) | ORAL | 1 refills | Status: DC
Start: 2023-12-22 — End: 2024-01-12

## 2023-12-22 NOTE — Addendum Note (Signed)
 Addended by: Smitty Cords on: 12/22/2023 03:10 PM   Modules accepted: Orders

## 2023-12-22 NOTE — Telephone Encounter (Signed)
 Copied from CRM [id number]. Topic: General - Other >> Dec 22, 2023  2:38 PM Yvone Marda Blow wrote: Pt Artman is calling and would like Dr MARLA to know that the heavy leg syndrome is no longer, since she stop taking Aricept .  She would like to ask if the Dr can go ahead and put the RX in for the medication they discussed at the last visit. Please advise.

## 2023-12-22 NOTE — Telephone Encounter (Signed)
 Attempted to reach patient to advise per Dr. Kirtland Bouchard. Left message to call us back.

## 2023-12-22 NOTE — Telephone Encounter (Signed)
 Please notify patient  I have ordered Namenda (Memantine) twice a day, sent to mail order pharmacy.  Saralyn Pilar, DO [facility] 12/22/2023, 3:10 PM

## 2024-01-07 ENCOUNTER — Telehealth: Payer: Self-pay | Admitting: Family Medicine

## 2024-01-07 ENCOUNTER — Ambulatory Visit: Payer: Self-pay

## 2024-01-07 NOTE — Telephone Encounter (Signed)
Medication Refill -  Most Recent Primary Care Visit:  Provider: Smitty Cords  Department: [facility]  Visit Type: OFFICE VISIT  Date: 11/24/2023  Medication: erythromycin ophthalmic ointment   Has the patient contacted their pharmacy? No (Agent: If no, request that the patient contact the pharmacy for the refill. If patient does not wish to contact the pharmacy document the reason why and proceed with request.) (Agent: If yes, when and what did the pharmacy advise?)  Is this the correct pharmacy for this prescription? Yes If no, delete pharmacy and type the correct one.  This is the patient's preferred pharmacy:  [facility] - [place], [place] - [address] [facility] [address] [place] [address] [place] [address] Phone: [phone] Fax: [phone]   Has the prescription been filled recently? Yes  Is the patient out of the medication? Yes  Has the patient been seen for an appointment in the last year OR does the patient have an upcoming appointment? Yes  Can we respond through MyChart? Yes  Agent: Please be advised that Rx refills may take up to 3 business days. We ask that you follow-up with your pharmacy.

## 2024-01-07 NOTE — Telephone Encounter (Signed)
Patient called in a separate encounter requesting antibiotic eye drops, will need to triage her symptoms.   Called pt  - left message to return our call.

## 2024-01-07 NOTE — Telephone Encounter (Signed)
Patient called in a separate encounter requesting antibiotic eye drops, will need to triage her symptoms.   Called pt - left message on machine to call back.

## 2024-01-07 NOTE — Telephone Encounter (Signed)
Patient called, left VM to return the call to speak to a Triage Nurse.   Patient called in a separate encounter requesting antibiotic eye drops, will need to triage her symptoms.

## 2024-01-07 NOTE — Telephone Encounter (Signed)
Unable to contact pt after 3 tries. I will forward to clinic for follow up as per protocol.

## 2024-01-07 NOTE — Telephone Encounter (Signed)
See nurse triage encounter, she will be triaged for symptoms relating to needing this eye drop.

## 2024-01-09 ENCOUNTER — Other Ambulatory Visit: Payer: Self-pay | Admitting: Family Medicine

## 2024-01-09 DIAGNOSIS — R6889 Other general symptoms and signs: Secondary | ICD-10-CM

## 2024-01-09 DIAGNOSIS — R413 Other amnesia: Secondary | ICD-10-CM

## 2024-01-12 ENCOUNTER — Ambulatory Visit: Payer: Self-pay

## 2024-01-12 DIAGNOSIS — R413 Other amnesia: Secondary | ICD-10-CM

## 2024-01-12 MED ORDER — DONEPEZIL HCL 5 MG PO TABS: 5.0000 mg | ORAL_TABLET | Freq: Every day | ORAL | 3 refills | Status: AC

## 2024-01-12 NOTE — Telephone Encounter (Signed)
3rd attempt to return pt's call. (The Ariccept 5 mg was discontinued 11/24/2023 due to side effects.   She is now on Memantine).  LOV 11/24/2023 with Dr. Althea Charon.  Per policy after 3 unsuccessful attempts to return her call I am forwarding this to [place].

## 2024-01-12 NOTE — Telephone Encounter (Signed)
Reason for Disposition . Prescription request for new medicine (not a refill)  Answer Assessment - Initial Assessment Questions 1. NAME of MEDICINE: "What medicine(s) are you calling about?"     Aricept vs Memantine 2. QUESTION: "What is your question?" (e.g., double dose of medicine, side effect)     See note 3. PRESCRIBER: "Who prescribed the medicine?" Reason: if prescribed by specialist, call should be referred to that group.     Dr. Althea Charon  Protocols used: Medication Question Call-A-AH

## 2024-01-12 NOTE — Telephone Encounter (Signed)
  Chief Complaint: Medication change Symptoms: see note Frequency:  Pertinent Negatives: Patient denies  Disposition: [] ED /[] Urgent Care (no appt availability in office) / [] Appointment(In office/virtual)/ []  [place] [facility]/ [] Home Care/ [] Refused Recommended Disposition /[] [place] [facility]/ [x]  Follow-up with PCP Additional Notes: Pt was taking Aricept and it was causing ler legs to be uncomfortable at night. Medication was changed to Memantine. Pt took the medication a few times, it caused stomach upset. Pt has gone back to Aricept. She is now taking it 2 hours after her other evening meds and she is not having any leg problems.  Pt apologizes for switching back to old medication w/o provider's knowledge. Pt would like refill for Aricept called in.

## 2024-01-12 NOTE — Telephone Encounter (Signed)
  Summary: rx req   The patient has called to share that they resumed taking donepezil (ARICEPT) 5 MG tablet [[id number]] 12/27/23  The patient shares that they had previously discussed cessation of their medication but would like to continue with the medication  The patient would like to speak with a member of clinical staff about their request further when possible  ----- Message from [place] C sent at 01/12/2024 10:48 AM EST ----- The patient has called to share that they resumed taking donepezil (ARICEPT) 5 MG tablet [[id number]] 12/27/23  The patient shares that they had previously discussed cessation of their medication but would like to continue with the medication  The patient would like to speak with a member of clinical staff about their request further when possible      Called pt - left message on machine to return our call.

## 2024-01-12 NOTE — Telephone Encounter (Signed)
Summary: rx req   The patient has called to share that they resumed taking donepezil (ARICEPT) 5 MG tablet [[id number]] 12/27/23  The patient shares that they had previously discussed cessation of their medication but would like to continue with the medication  The patient would like to speak with a member of clinical staff about their request further when possible  ----- Message from [facility] C sent at 01/12/2024 10:48 AM EST ----- The patient has called to share that they resumed taking donepezil (ARICEPT) 5 MG tablet [[id number]] 12/27/23  The patient shares that they had previously discussed cessation of their medication but would like to continue with the medication  The patient would like to speak with a member of clinical staff about their request further when possible        Called pt - left message to return our call.

## 2024-01-12 NOTE — Telephone Encounter (Signed)
Refused Aricept 5 mg because it was discontinued on 11/24/2023 due to side effects.

## 2024-01-12 NOTE — Addendum Note (Signed)
Addended by: Smitty Cords on: 01/12/2024 03:14 PM   Modules accepted: Orders

## 2024-01-12 NOTE — Telephone Encounter (Signed)
Yes okay to switch back to Donepezil 5mg  nightly Rx sent to [facility]  Saralyn Pilar, DO [facility] 01/12/2024, 3:13 PM

## 2024-01-29 ENCOUNTER — Ambulatory Visit: Payer: Medicare Other

## 2024-01-29 ENCOUNTER — Other Ambulatory Visit: Payer: Self-pay | Admitting: Family Medicine

## 2024-01-29 DIAGNOSIS — Z0000 Encounter for general adult medical examination without abnormal findings: Secondary | ICD-10-CM | POA: Diagnosis not present

## 2024-01-29 DIAGNOSIS — H109 Unspecified conjunctivitis: Secondary | ICD-10-CM

## 2024-01-29 MED ORDER — ERYTHROMYCIN 5 MG/GM OP OINT: 1.0000 | TOPICAL_OINTMENT | Freq: Four times a day (QID) | OPHTHALMIC | 3 refills | Status: AC

## 2024-01-29 NOTE — Progress Notes (Signed)
Subjective:   Paige House is a 89 y.o. female who presents for Medicare Annual (Subsequent) preventive examination.  Visit Complete: Virtual I connected with  Paige House on 01/29/24 by a audio enabled telemedicine application and verified that I am speaking with the correct person using two identifiers.  This patient declined Interactive audio and Acupuncturist. Therefore the visit was completed with audio only.   Patient Location: Home  Provider Location: Office/Clinic  I discussed the limitations of evaluation and management by telemedicine. The patient expressed understanding and agreed to proceed.  Vital Signs: Because this visit was a virtual/telehealth visit, some criteria may be missing or patient reported. Any vitals not documented were not able to be obtained and vitals that have been documented are patient reported.  Cardiac Risk Factors include: advanced age (>81men, >35 women);dyslipidemia     Objective:    There were no vitals filed for this visit. There is no height or weight on file to calculate BMI.     01/29/2024    2:45 PM 11/20/2020    2:44 PM 11/15/2019    2:57 PM 12/31/2015   11:00 AM  Advanced Directives  Does Patient Have a Medical Advance Directive? No Yes Yes Yes  Type of Special educational needs teacher of [place];Living will Living will;Healthcare Power of Attorney   Copy of Healthcare Power of Attorney in Chart?  No - copy requested No - copy requested   Would patient like information on creating a medical advance directive? No - Patient declined       Current Medications (verified) Outpatient Encounter Medications as of 01/29/2024  Medication Sig   acetaminophen (TYLENOL) 500 MG tablet Take 2 tabs up to 3 times a day for arthritis pain   buPROPion (WELLBUTRIN XL) 150 MG 24 hr tablet TAKE 1 TABLET BY MOUTH DAILY   diphenoxylate-atropine (LOMOTIL) 2.5-0.025 MG tablet TAKE 1 OR 2 TABLETS BY MOUTH 4  TIMES DAILY AS  NEEDED FOR  DIARRHEA   donepezil (ARICEPT) 5 MG tablet Take 1 tablet (5 mg total) by mouth at bedtime.   erythromycin ophthalmic ointment Place 1 Application into both eyes 4 (four) times daily. For 1-2 weeks or until resolved for conjunctivitis flare.   FLUoxetine (PROZAC) 20 MG capsule TAKE 1 CAPSULE BY MOUTH ONCE  DAILY   gabapentin (NEURONTIN) 600 MG tablet Take 2 tablets (1,200 mg total) by mouth 2 (two) times daily.   lisinopril (ZESTRIL) 5 MG tablet TAKE 1 TABLET BY MOUTH DAILY   LORazepam (ATIVAN) 0.5 MG tablet TAKE 1 TABLET BY MOUTH TWICE  DAILY AS NEEDED FOR ANXIETY OR  SLEEP   mirtazapine (REMERON) 7.5 MG tablet TAKE 1 TABLET BY MOUTH AT  BEDTIME   Multiple Vitamins-Minerals (ONE-A-DAY WOMENS 50 PLUS PO) Take by mouth.   mupirocin cream (BACTROBAN) 2 % APPLY 1 APPLICATION TOPICALLY  TWICE DAILY   mupirocin ointment (BACTROBAN) 2 % Apply 1 application. topically 2 (two) times daily as needed (skin sore).   oxybutynin (DITROPAN) 5 MG tablet Take 1 tablet (5 mg total) by mouth every 8 (eight) hours as needed for bladder spasms (OAB).   pravastatin (PRAVACHOL) 40 MG tablet TAKE 1 TABLET BY MOUTH DAILY   PROCTOZONE-HC 2.5 % rectal cream PLACE 1 APPLICATION RECTALLY  TWICE DAILY FOR 7 TO 10 DAYS OR  UNTIL RESOLVED, FOR INFLAMED  HEMORRHOID   silver sulfADIAZINE (SILVADENE) 1 % cream Apply 1 Application topically daily. For 7-10 days, repeat as needed.   triamcinolone cream (KENALOG)  0.5 % Apply topically 2 (two) times daily. Up to 2 weeks as needed pressure sure   fluticasone (FLONASE) 50 MCG/ACT nasal spray Place 2 sprays into both nostrils daily. Use for 4-6 weeks then stop and use seasonally or as needed. (Patient not taking: Reported on 01/29/2024)   ipratropium (ATROVENT) 0.03 % nasal spray USE 2 SPRAYS IN BOTH  NOSTRILS 3 TIMES DAILY (Patient not taking: Reported on 01/29/2024)   meclizine (ANTIVERT) 25 MG tablet TAKE 1 TABLET BY MOUTH DAILY AS  NEEDED FOR DIZZINESS (Patient not taking:  Reported on 01/29/2024)   methocarbamol (ROBAXIN) 500 MG tablet Take 1 tablet (500 mg total) by mouth at bedtime as needed for muscle spasms. (Patient not taking: Reported on 01/29/2024)   rOPINIRole (REQUIP) 2 MG tablet Take 1 tablet (2 mg total) by mouth at bedtime. (Patient not taking: Reported on 01/29/2024)   traMADol (ULTRAM) 50 MG tablet Take 1 tablet(s) EVERY 6 HOURS by oral route PRN pain (Patient not taking: Reported on 01/29/2024)   No facility-administered encounter medications on file as of 01/29/2024.    Allergies (verified) Patient has no known allergies.   History: Past Medical History:  Diagnosis Date   Anxiety    Hyperlipidemia    IBS (irritable bowel syndrome)    Past Surgical History:  Procedure Laterality Date   ABDOMINAL HYSTERECTOMY     APPENDECTOMY     CHOLECYSTECTOMY     Family History  Problem Relation Age of Onset   COPD Daughter    Coronary artery disease Daughter    Coronary artery disease Son    Obesity Daughter    Social History   Socioeconomic History   Marital status: Widowed    Spouse name: Not on file   Number of children: Not on file   Years of education: Not on file   Highest education level: Not on file  Occupational History   Occupation: retired  Tobacco Use   Smoking status: Former    Types: Cigarettes    Start date: 07/26/1985   Smokeless tobacco: Never  Vaping Use   Vaping status: Never Used  Substance and Sexual Activity   Alcohol use: Not Currently    Alcohol/week: 0.0 standard drinks of alcohol   Drug use: No   Sexual activity: Not on file  Other Topics Concern   Not on file  Social History Narrative   Not on file   Social Drivers of Health   Financial Resource Strain: Low Risk  (01/29/2024)   Overall Financial Resource Strain (CARDIA)    Difficulty of Paying Living Expenses: Not hard at all  Food Insecurity: No Food Insecurity (01/29/2024)   Hunger Vital Sign    Worried About Running Out of Food in the Last Year:  Never true    Ran Out of Food in the Last Year: Never true  Transportation Needs: No Transportation Needs (01/29/2024)   PRAPARE - Administrator, Civil Service (Medical): No    Lack of Transportation (Non-Medical): No  Physical Activity: Insufficiently Active (01/29/2024)   Exercise Vital Sign    Days of Exercise per Week: 7 days    Minutes of Exercise per Session: 10 min  Stress: Stress Concern Present (01/29/2024)   [employer] of Occupational Health - Occupational Stress Questionnaire    Feeling of Stress : Rather much  Social Connections: Socially Isolated (01/29/2024)   Social Connection and Isolation Panel [NHANES]    Frequency of Communication with Friends and Family: More than three times  a week    Frequency of Social Gatherings with Friends and Family: Twice a week    Attends Religious Services: Never    Database administrator or Organizations: No    Attends Banker Meetings: Never    Marital Status: Widowed    Tobacco Counseling Counseling given: Not Answered   Clinical Intake:  Pre-visit preparation completed: Yes  Pain : No/denies pain     BMI - recorded: 28.3 Nutritional Status: BMI 25 -29 Overweight Nutritional Risks: None Diabetes: No  How often do you need to have someone help you when you read instructions, pamphlets, or other written materials from your doctor or pharmacy?: 1 - Never  Interpreter Needed?: No  Information entered by :: Kennedy Bucker, LPN   Activities of Daily Living    01/29/2024    2:46 PM  In your present state of health, do you have any difficulty performing the following activities:  Hearing? 0  Vision? 0  Difficulty concentrating or making decisions? 0  Walking or climbing stairs? 1  Dressing or bathing? 0  Doing errands, shopping? 0  Preparing Food and eating ? N  Using the Toilet? N  In the past six months, have you accidently leaked urine? N  Do you have problems with loss of bowel  control? N  Managing your Medications? N  Managing your Finances? N  Housekeeping or managing your Housekeeping? N    Patient Care Team: Smitty Cords, DO as PCP - [facility]  Indicate any recent Medical Services you may have received from other than [facility] providers in the past year (date may be approximate).     Assessment:   This is a routine wellness examination for [place].  Hearing/Vision screen Hearing Screening - Comments:: NO AIDS Vision Screening - Comments:: WEARS GLASSES- WOODARD   Goals Addressed             This Visit's Progress    DIET - EAT MORE FRUITS AND VEGETABLES         Depression Screen    01/29/2024    2:40 PM 11/24/2023    3:48 PM 06/20/2022    6:38 PM 02/25/2022    2:30 PM 07/16/2021    2:13 PM 11/20/2020    2:46 PM 06/22/2020   11:25 AM  PHQ 2/9 Scores  PHQ - 2 Score 4 2 2 2 2  0 0  PHQ- 9 Score 6 4 12 11 12       Fall Risk    01/29/2024    2:46 PM 11/24/2023    3:48 PM 02/25/2022    2:30 PM 11/20/2020    2:46 PM 06/22/2020   11:25 AM  Fall Risk   Falls in the past year? 1 0 0 0 0  Number falls in past yr: 1  0  0  Injury with Fall? 1  0  0  Risk for fall due to : History of fall(s)  No Fall Risks Medication side effect   Follow up Falls evaluation completed;Falls prevention discussed  Falls evaluation completed Falls evaluation completed;Education provided;Falls prevention discussed Falls evaluation completed    MEDICARE RISK AT HOME: Medicare Risk at Home Any stairs in or around the home?: Yes If so, are there any without handrails?: No Home free of loose throw rugs in walkways, pet beds, electrical cords, etc?: Yes Adequate lighting in your home to reduce risk of falls?: Yes Life alert?: No Use of a cane, walker or  w/c?: No Grab bars in the bathroom?: No Shower chair or bench in shower?: Yes Elevated toilet seat or a handicapped toilet?: No  TIMED UP AND GO:  Was the test  performed?  No    Cognitive Function:        01/29/2024    2:48 PM 07/24/2022    1:30 PM 11/20/2020    2:49 PM  6CIT Screen  What Year? 0 points 0 points 0 points  What month? 0 points 0 points 0 points  What time? 0 points 0 points 0 points  Count back from 20 0 points 0 points 0 points  Months in reverse 0 points 0 points 0 points  Repeat phrase 4 points 0 points 0 points  Total Score 4 points 0 points 0 points    Immunizations Immunization History  Administered Date(s) Administered   Fluad Quad(high Dose 65+) 11/03/2019   Fluad Trivalent(High Dose 65+) 11/24/2023   Influenza, High Dose Seasonal PF 09/25/2015, 10/09/2017   PFIZER(Purple Top)SARS-COV-2 Vaccination 02/15/2020, 03/07/2020    TDAP status: Due, Education has been provided regarding the importance of this vaccine. Advised may receive this vaccine at local pharmacy or Health Dept. Aware to provide a copy of the vaccination record if obtained from local pharmacy or Health Dept. Verbalized acceptance and understanding.  Flu Vaccine status: Up to date  Pneumococcal vaccine status: Declined,  Education has been provided regarding the importance of this vaccine but patient still declined. Advised may receive this vaccine at local pharmacy or Health Dept. Aware to provide a copy of the vaccination record if obtained from local pharmacy or Health Dept. Verbalized acceptance and understanding.   Covid-19 vaccine status: Completed vaccines  Qualifies for Shingles Vaccine? Yes   Zostavax completed No   Shingrix Completed?: No.    Education has been provided regarding the importance of this vaccine. Patient has been advised to call insurance company to determine out of pocket expense if they have not yet received this vaccine. Advised may also receive vaccine at local pharmacy or Health Dept. Verbalized acceptance and understanding.  Screening Tests Health Maintenance  Topic Date Due   Pneumonia Vaccine 32+ Years old (1 of 2  - PCV) Never done   DTaP/Tdap/Td (1 - Tdap) Never done   Zoster Vaccines- Shingrix (1 of 2) Never done   COVID-19 Vaccine (3 - 2024-25 season) 08/16/2023   Medicare Annual Wellness (AWV)  01/28/2025   INFLUENZA VACCINE  Completed   HPV VACCINES  Aged Out   DEXA SCAN  Discontinued    Health Maintenance  Health Maintenance Due  Topic Date Due   Pneumonia Vaccine 67+ Years old (1 of 2 - PCV) Never done   DTaP/Tdap/Td (1 - Tdap) Never done   Zoster Vaccines- Shingrix (1 of 2) Never done   COVID-19 Vaccine (3 - 2024-25 season) 08/16/2023    Colorectal cancer screening: No longer required.   Mammogram status: No longer required due to AGE.   Lung Cancer Screening: (Low Dose CT Chest recommended if Age 10-80 years, 20 pack-year currently smoking OR have quit w/in 15years.) does not qualify.    Additional Screening:  Hepatitis C Screening: does not qualify; Completed NO  Vision Screening: Recommended annual ophthalmology exams for early detection of glaucoma and other disorders of the eye. Is the patient up to date with their annual eye exam?  Yes  Who is the provider or what is the name of the office in which the patient attends annual eye exams? DR.WOODARD If  pt is not established with a provider, would they like to be referred to a provider to establish care? No .   Dental Screening: Recommended annual dental exams for proper oral hygiene   Community Resource Referral / Chronic Care Management: CRR required this visit?  No   CCM required this visit?  No     Plan:     I have personally reviewed and noted the following in the patient's chart:   Medical and social history Use of alcohol, tobacco or illicit drugs  Current medications and supplements including opioid prescriptions. Patient is not currently taking opioid prescriptions. Functional ability and status Nutritional status Physical activity Advanced directives List of other physicians Hospitalizations,  surgeries, and ER visits in previous 12 months Vitals Screenings to include cognitive, depression, and falls Referrals and appointments  In addition, I have reviewed and discussed with patient certain preventive protocols, quality metrics, and best practice recommendations. A written personalized care plan for preventive services as well as general preventive health recommendations were provided to patient.     Hal Hope, LPN   1/61/0960   After Visit Summary: (MyChart) Due to this being a telephonic visit, the after visit summary with patients personalized plan was offered to patient via MyChart   Nurse Notes: NONE

## 2024-01-29 NOTE — Patient Instructions (Addendum)
Ms. Dray , Thank you for taking time to come for your Medicare Wellness Visit. I appreciate your ongoing commitment to your health goals. Please review the following plan we discussed and let me know if I can assist you in the future.   Referrals/Orders/Follow-Ups/Clinician Recommendations: NONE  This is a list of the screening recommended for you and due dates:  Health Maintenance  Topic Date Due   Pneumonia Vaccine (1 of 2 - PCV) Never done   DTaP/Tdap/Td vaccine (1 - Tdap) Never done   Zoster (Shingles) Vaccine (1 of 2) Never done   COVID-19 Vaccine (3 - 2024-25 season) 08/16/2023   Medicare Annual Wellness Visit  01/28/2025   Flu Shot  Completed   HPV Vaccine  Aged Out   DEXA scan (bone density measurement)  Discontinued    Advanced directives: (ACP Link)Information on Advanced Care Planning can be found at [facility] Advance Health Care Directives Advance Health Care Directives ([email or web address]   Next Medicare Annual Wellness Visit scheduled for next year: Yes   02/03/25 @ 12:40 PM BY PHONE

## 2024-01-31 ENCOUNTER — Other Ambulatory Visit: Payer: Self-pay | Admitting: Family Medicine

## 2024-01-31 DIAGNOSIS — R413 Other amnesia: Secondary | ICD-10-CM

## 2024-02-01 NOTE — Telephone Encounter (Signed)
 Requested Prescriptions  Refused Prescriptions Disp Refills   memantine (NAMENDA) 5 MG tablet [Pharmacy Med Name: Memantine HCl 5 MG Oral Tablet] 200 tablet 2    Sig: TAKE 1 TABLET BY MOUTH TWICE  DAILY     Neurology:  Alzheimer's Agents 2 Failed - 02/01/2024  5:55 PM      Failed - Cr in normal range and within 360 days    Creat  Date Value Ref Range Status  06/22/2020 1.17 (H) 0.60 - 0.88 mg/dL Final    Comment:    For patients >88 years of age, the reference limit for Creatinine is approximately 13% higher for people identified as African-American. .          Failed - eGFR is 5 or above and within 360 days    GFR, Est African American  Date Value Ref Range Status  06/22/2020 50 (L) > OR = 60 mL/min/1.73m2 Final   GFR, Est Non African American  Date Value Ref Range Status  06/22/2020 43 (L) > OR = 60 mL/min/1.19m2 Final         Passed - Valid encounter within last 6 months    Recent Outpatient Visits           2 months ago Flu vaccine need   [place] [facility], Netta Neat, DO   1 year ago Memory loss   [facility] Smitty Cords, DO   1 year ago No-show for appointment   [facility] Smitty Cords, DO   1 year ago Insomnia, unspecified type   [facility] Smitty Cords, DO   1 year ago Finger pain, right   [facility] [place], Netta Neat, [place]

## 2024-02-14 ENCOUNTER — Other Ambulatory Visit: Payer: Self-pay | Admitting: Family Medicine

## 2024-02-14 DIAGNOSIS — G4700 Insomnia, unspecified: Secondary | ICD-10-CM

## 2024-02-16 ENCOUNTER — Telehealth: Payer: Self-pay

## 2024-02-16 ENCOUNTER — Other Ambulatory Visit: Payer: Self-pay | Admitting: Family Medicine

## 2024-02-16 DIAGNOSIS — R413 Other amnesia: Secondary | ICD-10-CM

## 2024-02-16 NOTE — Telephone Encounter (Signed)
 Requested Prescriptions  Pending Prescriptions Disp Refills   mirtazapine (REMERON) 7.5 MG tablet [Pharmacy Med Name: MIRTAZAPINE  7.5MG   TAB] 100 tablet 1    Sig: TAKE 1 TABLET BY MOUTH AT  BEDTIME     Psychiatry: Antidepressants - mirtazapine Passed - 02/16/2024 10:24 AM      Passed - Completed PHQ-2 or PHQ-9 in the last 360 days      Passed - Valid encounter within last 6 months    Recent Outpatient Visits           2 months ago Flu vaccine need   [place] [facility] [place], Netta Neat, DO   1 year ago Memory loss   [facility] Smitty Cords, DO   1 year ago No-show for appointment   [facility] Smitty Cords, DO   1 year ago Insomnia, unspecified type   [facility] Smitty Cords, DO   1 year ago Finger pain, right   [facility] [place], Netta Neat, [place]

## 2024-02-16 NOTE — Telephone Encounter (Signed)
 Please notify patient of the following:  It depends on what she is looking for - regards to training / skill level of the caregiver and hours / duration she is expecting them to be there.  In Home caregivers are rarely covered by insurance / medicare. Most of these services are out of pocket cost to patients.  Patients who are dual eligible Medicare + Medicaid, may be able to get "In Home - Personal Care Services" which is a home aide who can assist with many daily activities and can be structured to assist several days a week etc if available.  Typically to discuss this process and find out what options are available, cost coverage etc - we refer our patients to our "Care Management Team" or VBCI. Juanell Fairly RN Case manager + [employer] LCSW social work are the main team members that work on this topic.  If Paige House agrees, we can refer to them. They can call her and discuss these options after their initial intake and see if this is something we can help her with.  My role is to sign off on the medical reasons for needing the caregiver, but I do not manage the coverage or eligibility.  Let me know if further questions.  Saralyn Pilar, DO [facility] 02/16/2024, 6:41 PM

## 2024-02-16 NOTE — Telephone Encounter (Signed)
 Copied from CRM [id number]. Topic: General - Other >> Feb 16, 2024 10:37 AM Macon Large wrote: Reason for CRM: Patient reports that she falls frequently and she needs to ask Dr. Kirtland Bouchard for an in home caregiver. Patient stated that she is afraid that she may fall and it may be a long period of time before someone finds her. Patient requests call back to discuss getting an in home caregiver.

## 2024-02-17 ENCOUNTER — Telehealth: Payer: Self-pay

## 2024-02-17 DIAGNOSIS — M160 Bilateral primary osteoarthritis of hip: Secondary | ICD-10-CM

## 2024-02-17 DIAGNOSIS — K589 Irritable bowel syndrome without diarrhea: Secondary | ICD-10-CM

## 2024-02-17 DIAGNOSIS — N3281 Overactive bladder: Secondary | ICD-10-CM

## 2024-02-17 DIAGNOSIS — M170 Bilateral primary osteoarthritis of knee: Secondary | ICD-10-CM

## 2024-02-17 NOTE — Telephone Encounter (Signed)
 Copied from CRM [id number]. Topic: General - Call Back - No Documentation >> Feb 17, 2024  2:01 PM DeAngela L wrote: Reason for CRM: Pt returning phone call about home care nurse and would like to be put in contact with Juanell Fairly RN Case manager or Chrystal Land LCSW social work, these two were recommended by Dr Althea Charon

## 2024-02-17 NOTE — Addendum Note (Signed)
 Addended by: Smitty Cords on: 02/17/2024 04:44 PM   Modules accepted: Orders

## 2024-02-17 NOTE — Telephone Encounter (Signed)
 Referral submitted.  Saralyn Pilar, DO [facility] 02/17/2024, 4:43 PM

## 2024-02-17 NOTE — Telephone Encounter (Signed)
 LM for patient to return call.

## 2024-02-17 NOTE — Telephone Encounter (Signed)
 Medication no longer listed on current medication list Requested Prescriptions  Pending Prescriptions Disp Refills   memantine (NAMENDA) 5 MG tablet [Pharmacy Med Name: Memantine HCl 5 MG Oral Tablet] 200 tablet 2    Sig: TAKE 1 TABLET BY MOUTH TWICE  DAILY     Neurology:  Alzheimer's Agents 2 Failed - 02/17/2024  2:36 PM      Failed - Cr in normal range and within 360 days    Creat  Date Value Ref Range Status  06/22/2020 1.17 (H) 0.60 - 0.88 mg/dL Final    Comment:    For patients >29 years of age, the reference limit for Creatinine is approximately 13% higher for people identified as African-American. .          Failed - eGFR is 5 or above and within 360 days    GFR, Est African American  Date Value Ref Range Status  06/22/2020 50 (L) > OR = 60 mL/min/1.28m2 Final   GFR, Est Non African American  Date Value Ref Range Status  06/22/2020 43 (L) > OR = 60 mL/min/1.44m2 Final         Passed - Valid encounter within last 6 months    Recent Outpatient Visits           2 months ago Flu vaccine need   [place] [facility] [place], Netta Neat, DO   1 year ago Memory loss   [facility] Smitty Cords, DO   1 year ago No-show for appointment   [facility] Smitty Cords, DO   1 year ago Insomnia, unspecified type   [facility] Smitty Cords, DO   1 year ago Finger pain, right   [facility] [place], Netta Neat, [place]

## 2024-02-18 ENCOUNTER — Telehealth: Payer: Self-pay | Admitting: *Deleted

## 2024-02-18 NOTE — Progress Notes (Signed)
 Complex Care Management Note Care Guide Note  02/18/2024 Name: Paige House MRN: [id number] DOB: [birth year]   Complex Care Management Outreach Attempts: An unsuccessful telephone outreach was attempted today to offer the patient information about available complex care management services.  Follow Up Plan:  Additional outreach attempts will be made to offer the patient complex care management information and services.   Encounter Outcome:  No Answer  [name], CMA, Care Guide [facility]  [facility], [facility] Guide Direct Dial: [phone]  Fax: [phone] Website: [email or web address]

## 2024-02-19 NOTE — Progress Notes (Signed)
 Complex Care Management Note Care Guide Note  02/19/2024 Name: Paige House MRN: [id number] DOB: [birth year]   Complex Care Management Outreach Attempts: A second unsuccessful outreach was attempted today to offer the patient with information about available complex care management services.  Follow Up Plan:  Additional outreach attempts will be made to offer the patient complex care management information and services.   Encounter Outcome:  No Answer  [name], CMA, Care Guide [facility]  [facility], [facility] Guide Direct Dial: [phone]  Fax: [phone] Website: [email or web address]

## 2024-02-22 NOTE — Progress Notes (Signed)
 Complex Care Management Note Care Guide Note  02/22/2024 Name: Paige House MRN: [id number] DOB: [birth year]   Complex Care Management Outreach Attempts: A third unsuccessful outreach was attempted today to offer the patient with information about available complex care management services.  Follow Up Plan:  No further outreach attempts will be made at this time. We have been unable to contact the patient to offer or enroll patient in complex care management services.  Encounter Outcome:  No Answer  [name], CMA, Care Guide [facility]  [facility] Guide Direct Dial: [phone]  Fax: [phone] Website: [email or web address]

## 2024-02-26 ENCOUNTER — Telehealth: Payer: Self-pay | Admitting: Family Medicine

## 2024-02-26 NOTE — Telephone Encounter (Signed)
 Copied from CRM [id number]. Topic: Clinical - Medical Advice >> Feb 26, 2024  9:26 AM Marland Kitchen D wrote: Patient wants to know if the dosage of her gabapentin (NEURONTIN) 600 MG tablet and if it can be done send it to [facility]. Patient states her leg pain keeps her up at night.

## 2024-02-26 NOTE — Telephone Encounter (Signed)
 [facility] called and spoke to [place], [facility] about the refill(s) gabapentin 600 mg requested. Advised it was sent on 11/24/23 #360/3 refill(s). He says it's due for a refill and it will be sent out today, patient should expect by the middle to the end of next week. Patient called, recording this call could not be completed as dialed. If she returns the call, let her know the above information.

## 2024-03-09 ENCOUNTER — Other Ambulatory Visit: Payer: Self-pay | Admitting: Family Medicine

## 2024-03-09 DIAGNOSIS — R635 Abnormal weight gain: Secondary | ICD-10-CM

## 2024-03-09 DIAGNOSIS — F50819 Binge eating disorder, unspecified: Secondary | ICD-10-CM

## 2024-03-09 DIAGNOSIS — R632 Polyphagia: Secondary | ICD-10-CM

## 2024-03-11 NOTE — Telephone Encounter (Signed)
 Requested Prescriptions  Pending Prescriptions Disp Refills   FLUoxetine (PROZAC) 20 MG capsule [Pharmacy Med Name: FLUoxetine HCl 20 MG Oral Capsule] 100 capsule 0    Sig: TAKE 1 CAPSULE BY MOUTH ONCE  DAILY     Psychiatry:  Antidepressants - SSRI Passed - 03/11/2024 10:02 AM      Passed - Completed PHQ-2 or PHQ-9 in the last 360 days      Passed - Valid encounter within last 6 months    Recent Outpatient Visits   None             buPROPion (WELLBUTRIN XL) 150 MG 24 hr tablet [Pharmacy Med Name: buPROPion HCl ER (XL) 150 MG Oral Tablet Extended Release 24 Hour] 100 tablet 2    Sig: TAKE 1 TABLET BY MOUTH DAILY     Psychiatry: Antidepressants - bupropion Failed - 03/11/2024 10:02 AM      Failed - Cr in normal range and within 360 days    Creat  Date Value Ref Range Status  06/22/2020 1.17 (H) 0.60 - 0.88 mg/dL Final    Comment:    For patients >70 years of age, the reference limit for Creatinine is approximately 13% higher for people identified as African-American. .          Failed - AST in normal range and within 360 days    AST  Date Value Ref Range Status  06/22/2020 22 10 - 35 U/L Final   SGOT(AST)  Date Value Ref Range Status  04/19/2012 49 (H) 15 - 37 Unit/L Final         Failed - ALT in normal range and within 360 days    ALT  Date Value Ref Range Status  06/22/2020 14 6 - 29 U/L Final   SGPT (ALT)  Date Value Ref Range Status  04/19/2012 46 U/L Final    Comment:    12-78 NOTE: NEW REFERENCE RANGE 11/07/2011          Passed - Completed PHQ-2 or PHQ-9 in the last 360 days      Passed - Last BP in normal range    BP Readings from Last 1 Encounters:  11/24/23 134/76         Passed - Valid encounter within last 6 months    Recent Outpatient Visits   None

## 2024-03-11 NOTE — Telephone Encounter (Signed)
 Requested medication (s) are due for refill today: yes  Requested medication (s) are on the active medication list: yes  Last refill:  02/04/23 #100/3  Future visit scheduled: no  Notes to clinic:  Unable to refill per protocol due to failed labs, no updated results.      Requested Prescriptions  Pending Prescriptions Disp Refills   buPROPion (WELLBUTRIN XL) 150 MG 24 hr tablet [Pharmacy Med Name: buPROPion HCl ER (XL) 150 MG Oral Tablet Extended Release 24 Hour] 100 tablet 2    Sig: TAKE 1 TABLET BY MOUTH DAILY     Psychiatry: Antidepressants - bupropion Failed - 03/11/2024 10:03 AM      Failed - Cr in normal range and within 360 days    Creat  Date Value Ref Range Status  06/22/2020 1.17 (H) 0.60 - 0.88 mg/dL Final    Comment:    For patients >59 years of age, the reference limit for Creatinine is approximately 13% higher for people identified as African-American. .          Failed - AST in normal range and within 360 days    AST  Date Value Ref Range Status  06/22/2020 22 10 - 35 U/L Final   SGOT(AST)  Date Value Ref Range Status  04/19/2012 49 (H) 15 - 37 Unit/L Final         Failed - ALT in normal range and within 360 days    ALT  Date Value Ref Range Status  06/22/2020 14 6 - 29 U/L Final   SGPT (ALT)  Date Value Ref Range Status  04/19/2012 46 U/L Final    Comment:    12-78 NOTE: NEW REFERENCE RANGE 11/07/2011          Passed - Completed PHQ-2 or PHQ-9 in the last 360 days      Passed - Last BP in normal range    BP Readings from Last 1 Encounters:  11/24/23 134/76         Passed - Valid encounter within last 6 months    Recent Outpatient Visits   None            Signed Prescriptions Disp Refills   FLUoxetine (PROZAC) 20 MG capsule 100 capsule 0    Sig: TAKE 1 CAPSULE BY MOUTH ONCE  DAILY     Psychiatry:  Antidepressants - SSRI Passed - 03/11/2024 10:03 AM      Passed - Completed PHQ-2 or PHQ-9 in the last 360 days      Passed -  Valid encounter within last 6 months    Recent Outpatient Visits   None

## 2024-03-24 ENCOUNTER — Other Ambulatory Visit: Payer: Self-pay | Admitting: Family Medicine

## 2024-03-24 DIAGNOSIS — E782 Mixed hyperlipidemia: Secondary | ICD-10-CM

## 2024-03-24 DIAGNOSIS — N1831 Chronic kidney disease, stage 3a: Secondary | ICD-10-CM

## 2024-03-25 NOTE — Telephone Encounter (Signed)
 Requested medication (s) are due for refill today - yes  Requested medication (s) are on the active medication list -yes  Future visit scheduled -yes  Last refill: lisinopril 04/07/23 #100 3RF                 Pravastatin 04/07/23 #100 2RF  Notes to clinic: fails lab protocol- over 1 year- 06/22/20  Requested Prescriptions  Pending Prescriptions Disp Refills   lisinopril (ZESTRIL) 5 MG tablet [Pharmacy Med Name: Lisinopril 5 MG Oral Tablet] 100 tablet 2    Sig: TAKE 1 TABLET BY MOUTH DAILY     Cardiovascular:  ACE Inhibitors Failed - 03/25/2024 12:52 PM      Failed - Cr in normal range and within 180 days    Creat  Date Value Ref Range Status  06/22/2020 1.17 (H) 0.60 - 0.88 mg/dL Final    Comment:    For patients >88 years of age, the reference limit for Creatinine is approximately 13% higher for people identified as African-American. .          Failed - K in normal range and within 180 days    Potassium  Date Value Ref Range Status  06/22/2020 4.6 3.5 - 5.3 mmol/L Final  04/19/2012 3.9 3.5 - 5.1 mmol/L Final         Passed - Patient is not pregnant      Passed - Last BP in normal range    BP Readings from Last 1 Encounters:  11/24/23 134/76         Passed - Valid encounter within last 6 months    Recent Outpatient Visits   None             pravastatin (PRAVACHOL) 40 MG tablet [Pharmacy Med Name: Pravastatin Sodium 40 MG Oral Tablet] 100 tablet 2    Sig: TAKE 1 TABLET BY MOUTH DAILY     Cardiovascular:  Antilipid - Statins Failed - 03/25/2024 12:52 PM      Failed - Lipid Panel in normal range within the last 12 months    Cholesterol, Total  Date Value Ref Range Status  08/24/2015 244 (H) 100 - 199 mg/dL Final   Cholesterol  Date Value Ref Range Status  06/22/2020 187 <200 mg/dL Final   LDL Cholesterol (Calc)  Date Value Ref Range Status  06/22/2020 107 (H) mg/dL (calc) Final    Comment:    Reference range: <100 . Desirable range <100 mg/dL for  primary prevention;   <70 mg/dL for patients with CHD or diabetic patients  with > or = 2 CHD risk factors. Marland Kitchen LDL-C is now calculated using the Martin-Hopkins  calculation, which is a validated novel method providing  better accuracy than the Friedewald equation in the  estimation of LDL-C.  Horald Pollen et al. Lenox Ahr. 6962;952(84): 2061-2068  (http://education.[facility].com/faq/FAQ164)    HDL  Date Value Ref Range Status  06/22/2020 56 > OR = 50 mg/dL Final  13/24/4010 56 >27 mg/dL Final    Comment:    According to ATP-III Guidelines, HDL-C >59 mg/dL is considered a negative risk factor for CHD.    Triglycerides  Date Value Ref Range Status  06/22/2020 128 <150 mg/dL Final         Passed - Patient is not pregnant      Passed - Valid encounter within last 12 months    Recent Outpatient Visits   None               Requested Prescriptions  Pending Prescriptions Disp Refills   lisinopril (ZESTRIL) 5 MG tablet [Pharmacy Med Name: Lisinopril 5 MG Oral Tablet] 100 tablet 2    Sig: TAKE 1 TABLET BY MOUTH DAILY     Cardiovascular:  ACE Inhibitors Failed - 03/25/2024 12:52 PM      Failed - Cr in normal range and within 180 days    Creat  Date Value Ref Range Status  06/22/2020 1.17 (H) 0.60 - 0.88 mg/dL Final    Comment:    For patients >88 years of age, the reference limit for Creatinine is approximately 13% higher for people identified as African-American. .          Failed - K in normal range and within 180 days    Potassium  Date Value Ref Range Status  06/22/2020 4.6 3.5 - 5.3 mmol/L Final  04/19/2012 3.9 3.5 - 5.1 mmol/L Final         Passed - Patient is not pregnant      Passed - Last BP in normal range    BP Readings from Last 1 Encounters:  11/24/23 134/76         Passed - Valid encounter within last 6 months    Recent Outpatient Visits   None             pravastatin (PRAVACHOL) 40 MG tablet [Pharmacy Med Name: Pravastatin Sodium 40  MG Oral Tablet] 100 tablet 2    Sig: TAKE 1 TABLET BY MOUTH DAILY     Cardiovascular:  Antilipid - Statins Failed - 03/25/2024 12:52 PM      Failed - Lipid Panel in normal range within the last 12 months    Cholesterol, Total  Date Value Ref Range Status  08/24/2015 244 (H) 100 - 199 mg/dL Final   Cholesterol  Date Value Ref Range Status  06/22/2020 187 <200 mg/dL Final   LDL Cholesterol (Calc)  Date Value Ref Range Status  06/22/2020 107 (H) mg/dL (calc) Final    Comment:    Reference range: <100 . Desirable range <100 mg/dL for primary prevention;   <70 mg/dL for patients with CHD or diabetic patients  with > or = 2 CHD risk factors. Marland Kitchen LDL-C is now calculated using the Martin-Hopkins  calculation, which is a validated novel method providing  better accuracy than the Friedewald equation in the  estimation of LDL-C.  Horald Pollen et al. Lenox Ahr. 4540;981(19): 2061-2068  (http://education.[facility].com/faq/FAQ164)    HDL  Date Value Ref Range Status  06/22/2020 56 > OR = 50 mg/dL Final  14/78/2956 56 >21 mg/dL Final    Comment:    According to ATP-III Guidelines, HDL-C >59 mg/dL is considered a negative risk factor for CHD.    Triglycerides  Date Value Ref Range Status  06/22/2020 128 <150 mg/dL Final         Passed - Patient is not pregnant      Passed - Valid encounter within last 12 months    Recent Outpatient Visits   None

## 2024-04-02 ENCOUNTER — Other Ambulatory Visit: Payer: Self-pay | Admitting: Family Medicine

## 2024-04-02 DIAGNOSIS — K589 Irritable bowel syndrome without diarrhea: Secondary | ICD-10-CM

## 2024-04-04 NOTE — Telephone Encounter (Signed)
 Requested medications are due for refill today.  yes  Requested medications are on the active medications list.  yes  Last refill. 09/14/2023 #60 3 rf  Future visit scheduled.   yes  Notes to clinic.  Refill not delegated.    Requested Prescriptions  Pending Prescriptions Disp Refills   diphenoxylate -atropine  (LOMOTIL ) 2.5-0.025 MG tablet [Pharmacy Med Name: DIPHEN/ATROP TAB 2.5/.025MG ] 60 tablet     Sig: TAKE 1 OR 2 TABLETS BY MOUTH 4  TIMES DAILY AS NEEDED FOR  DIARRHEA     Not Delegated - Gastroenterology:  Antidiarrheals Failed - 04/04/2024 12:39 PM      Failed - This refill cannot be delegated      Passed - Valid encounter within last 12 months    Recent Outpatient Visits   None

## 2024-04-06 NOTE — Telephone Encounter (Signed)
 We continue to receive fax refill request for the Lomotil  for this patient. It was sent in on 04/02/24 to [facility]. Do you mind contacting either pharmacy or patient to find out status if anything else is needed?  Domingo Friend, DO [facility] 04/06/2024, 7:51 PM

## 2024-04-08 ENCOUNTER — Other Ambulatory Visit: Payer: Self-pay

## 2024-04-08 ENCOUNTER — Telehealth: Payer: Self-pay

## 2024-04-08 DIAGNOSIS — K589 Irritable bowel syndrome without diarrhea: Secondary | ICD-10-CM

## 2024-04-08 MED ORDER — DIPHENOXYLATE-ATROPINE 2.5-0.025 MG PO TABS: ORAL_TABLET | ORAL | 3 refills | Status: DC

## 2024-04-08 NOTE — Telephone Encounter (Signed)
 Copied from CRM [id number]. Topic: Clinical - Prescription Issue >> Apr 08, 2024 12:28 PM Lotus Round B wrote: Reason for CRM: pt called in upset because she received a text from [facility] stating that the provider never signed off on medication  diphenoxylate -atropine  (LOMOTIL ) 2.5-0.025 MG tablet to get it refilled . Pt wondering if there is any way to provider can send that script to the [facility] so they can refill it . She only has 5 pills left and she stated she has been on this all her life and for the rest of her life so she should never run out.

## 2024-04-20 ENCOUNTER — Telehealth: Payer: Self-pay

## 2024-04-20 NOTE — Telephone Encounter (Signed)
 Copied from CRM [id number]. Topic: General - Other >> Apr 20, 2024  9:57 AM Paige House wrote: Reason for CRM: Patient called.. having issues with allergy.. 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 Would like to be prescribed: Cetirizine hcl 10mg .. her number on file is good.. she prefers [facility]

## 2024-05-18 ENCOUNTER — Other Ambulatory Visit: Payer: Self-pay | Admitting: Family Medicine

## 2024-05-18 DIAGNOSIS — F50819 Binge eating disorder, unspecified: Secondary | ICD-10-CM

## 2024-05-19 NOTE — Telephone Encounter (Signed)
 Courtesy refill. Patient will need an office visit for additional refills.  Requested Prescriptions  Pending Prescriptions Disp Refills   FLUoxetine  (PROZAC ) 20 MG capsule [Pharmacy Med Name: FLUoxetine  HCl 20 MG Oral Capsule] 30 capsule 0    Sig: TAKE 1 CAPSULE BY MOUTH ONCE  DAILY     Psychiatry:  Antidepressants - SSRI Passed - 05/19/2024  5:53 PM      Passed - Completed PHQ-2 or PHQ-9 in the last 360 days      Passed - Valid encounter within last 6 months    Recent Outpatient Visits   None

## 2024-05-27 ENCOUNTER — Other Ambulatory Visit: Payer: Self-pay

## 2024-05-27 MED ORDER — POLYMYXIN B-TRIMETHOPRIM 10000-0.1 UNIT/ML-% OP SOLN: 1.0000 [drp] | OPHTHALMIC | 0 refills | Status: DC

## 2024-05-27 NOTE — Addendum Note (Signed)
 Addended by: Carollynn Cirri on: 05/27/2024 01:01 PM   Modules accepted: Orders

## 2024-05-27 NOTE — Addendum Note (Signed)
 Addended by: Carollynn Cirri on: 05/27/2024 01:55 PM   Modules accepted: Orders

## 2024-05-27 NOTE — Telephone Encounter (Signed)
 Typically, this is something that would require an appointment at least with a virtual urgent care provider however I am willing to send her in eyedrops this 1 time.  What pharmacy does she prefer?

## 2024-05-27 NOTE — Telephone Encounter (Signed)
 Copied from CRM [id number]. Topic: Clinical - Medication Question >> May 27, 2024 12:25 PM Phil Braun wrote: Reason for CRM: Pt called and has pink eye (eye infection) and is needing medication. There is no appts available and she has to give notice so she can have transportation. She has had this in the past and was prescribed medication for conjunctivitis. Could you please call her in medication for this?

## 2024-06-03 ENCOUNTER — Other Ambulatory Visit: Payer: Self-pay | Admitting: Family Medicine

## 2024-06-03 DIAGNOSIS — N3281 Overactive bladder: Secondary | ICD-10-CM

## 2024-06-04 ENCOUNTER — Other Ambulatory Visit: Payer: Self-pay | Admitting: Family Medicine

## 2024-06-04 DIAGNOSIS — F50819 Binge eating disorder, unspecified: Secondary | ICD-10-CM

## 2024-06-07 NOTE — Telephone Encounter (Signed)
 Requested medications are due for refill today.  yes  Requested medications are on the active medications list.  yes  Last refill. 05/19/2024 #30 0 rf  Future visit scheduled.   In February 2026  Notes to clinic.  Pt is due for OV. Already given a courtesy refill.    Requested Prescriptions  Pending Prescriptions Disp Refills   FLUoxetine  (PROZAC ) 20 MG capsule [Pharmacy Med Name: FLUoxetine  HCl 20 MG Oral Capsule] 30 capsule 11    Sig: TAKE 1 CAPSULE BY MOUTH ONCE  DAILY     Psychiatry:  Antidepressants - SSRI Passed - 06/07/2024 12:55 PM      Passed - Completed PHQ-2 or PHQ-9 in the last 360 days      Passed - Valid encounter within last 6 months    Recent Outpatient Visits   None

## 2024-06-07 NOTE — Telephone Encounter (Signed)
 Too soon for refill.  Requested Prescriptions  Pending Prescriptions Disp Refills   oxybutynin  (DITROPAN ) 5 MG tablet [Pharmacy Med Name: Oxybutynin  Chloride 5 MG Oral Tablet] 300 tablet 2    Sig: TAKE 1 TABLET BY MOUTH EVERY 8  HOURS AS NEEDED FOR BLADDER  SPASMS/OVERACTIVE BLADDER     Urology:  Bladder Agents Passed - 06/07/2024 11:37 AM      Passed - Valid encounter within last 12 months    Recent Outpatient Visits   None

## 2024-06-21 ENCOUNTER — Telehealth: Payer: Self-pay

## 2024-06-21 NOTE — Telephone Encounter (Signed)
 I called patient. We briefly discussed her concern with need for medical documentation letter for submitting to landlord. She will need documentation of reasonable medical accommodation that she can have live-in-caregiver step daughter to assist with her disabilities and chronic health conditions. She is scheduled for Thursday  I advised that I can provide a letter and documentation. Ultimately it is up to her landlord and the lease contract agreement. She says she did not have a lease, therefore, it is up to her discussion with landlord, I am not sure I can resolve this situation. But I will provide medical documentation to assist her case.  Marsa Officer, DO [facility] 06/21/2024, 5:48 PM

## 2024-06-21 NOTE — Telephone Encounter (Signed)
 Copied from CRM [id number]. Topic: Clinical - Medical Advice >> Jun 21, 2024  9:46 AM Antwanette L wrote: Reason for CRM: Patient is requesting a call back from Dr.Karamalegos. The patient said its a personal matter. Please contact the patient at [phone]

## 2024-06-23 ENCOUNTER — Telehealth: Payer: Self-pay

## 2024-06-23 ENCOUNTER — Ambulatory Visit (INDEPENDENT_AMBULATORY_CARE_PROVIDER_SITE_OTHER): Admitting: Family Medicine

## 2024-06-23 ENCOUNTER — Encounter: Payer: Self-pay | Admitting: Family Medicine

## 2024-06-23 VITALS — BP 118/62 | HR 94 | Ht 60.0 in | Wt 148.4 lb

## 2024-06-23 DIAGNOSIS — H109 Unspecified conjunctivitis: Secondary | ICD-10-CM

## 2024-06-23 DIAGNOSIS — M170 Bilateral primary osteoarthritis of knee: Secondary | ICD-10-CM

## 2024-06-23 DIAGNOSIS — G3184 Mild cognitive impairment, so stated: Secondary | ICD-10-CM | POA: Insufficient documentation

## 2024-06-23 DIAGNOSIS — K589 Irritable bowel syndrome without diarrhea: Secondary | ICD-10-CM

## 2024-06-23 DIAGNOSIS — N3946 Mixed incontinence: Secondary | ICD-10-CM

## 2024-06-23 DIAGNOSIS — N1831 Chronic kidney disease, stage 3a: Secondary | ICD-10-CM

## 2024-06-23 DIAGNOSIS — M160 Bilateral primary osteoarthritis of hip: Secondary | ICD-10-CM

## 2024-06-23 DIAGNOSIS — N3281 Overactive bladder: Secondary | ICD-10-CM

## 2024-06-23 DIAGNOSIS — G609 Hereditary and idiopathic neuropathy, unspecified: Secondary | ICD-10-CM

## 2024-06-23 DIAGNOSIS — J3089 Other allergic rhinitis: Secondary | ICD-10-CM | POA: Diagnosis not present

## 2024-06-23 MED ORDER — CETIRIZINE HCL 10 MG PO TABS: 10.0000 mg | ORAL_TABLET | Freq: Every day | ORAL | Status: AC

## 2024-06-23 MED ORDER — OLOPATADINE HCL 0.2 % OP SOLN: 1.0000 [drp] | Freq: Every day | OPHTHALMIC | 0 refills | Status: AC

## 2024-06-23 MED ORDER — POLYMYXIN B-TRIMETHOPRIM 10000-0.1 UNIT/ML-% OP SOLN: 1.0000 [drp] | Freq: Four times a day (QID) | OPHTHALMIC | 0 refills | Status: AC

## 2024-06-23 NOTE — Telephone Encounter (Signed)
 Copied from CRM [id number]. Topic: Appointments - Appointment Info/Confirmation >> Jun 23, 2024  2:34 PM Deleta RAMAN wrote: Patient/patient representative is calling for information regarding an appointment on 7/10 @ 11:20 she would like an after visit summary. I did notify patient that it will be on my chart as well. She does not have my chart access and no interest in getting access. She wants to know if someone can send it to her

## 2024-06-23 NOTE — Progress Notes (Signed)
 Subjective:    Patient ID: Paige House, female    DOB: 1934/02/13, 89 y.o.   MRN: [id number]  Paige House is a 89 y.o. female presenting on 06/23/2024 for Eye Problem (Eye infection, eyes swell up concern it could be do to allergies it is causing her pain. Both eye. Has been going on for weeks. Dr. Angeline did prescribed medication and she says they did help her but once she finished the medication the swelling started again. )   HPI  Discussed the use of AI scribe software for clinical note transcription with the patient, who gave verbal consent to proceed.  History of Present Illness   Paige House is an 89 year old female who presents with red, swollen eyes.  Conjunctivitis bilateral - Red, swollen eyes with bloodshot appearance by end of day - Bilateral involvement, with one eye more affected than the other - Associated ocular discomfort - History of similar episodes in the past, previously relieved by eye drops - Current episode recurred without clear precipitant - No fever - Damp face cloth compress provides symptomatic relief - Impaired ability to read due to ocular discomfort  Allergic symptoms - History of allergies - Takes Benadryl 25 mg in the morning for allergy management - Suspects pollen as a possible trigger for ocular symptoms  Xerostomia and mucosal dryness - Dry lips attributed to oxybutynin  use  Cognitive impairment - Early-stage dementia - Takes Aricept  with perceived benefit  Mobility impairment - Declining overall health - Uses a walker for ambulation     Step daughter moved in as primary caregiver she benefits medically from receiving caregiver support in the home on a daily basis. Her Step-Daughter is currently providing this service for her at this time. She is assisting her with many ADLs and all iADLs. Analyse has limited mobility and uses a walker to ambulate in the home. She cannot walk long distances. She benefits from  assistance from her caregiver support including the following tasks: purchasing groceries, food preparation, house cleaning, laundry, transportation to medical appointments, assistance with bathing and showering.      01/29/2024    2:40 PM 11/24/2023    3:48 PM 06/20/2022    6:38 PM  Depression screen PHQ 2/9  Decreased Interest 2 1 1   Down, Depressed, Hopeless 2 1 1   PHQ - 2 Score 4 2 2   Altered sleeping 2 0 3  Tired, decreased energy 0 1 3  Change in appetite 0 1   Feeling bad or failure about yourself  0 0 2  Trouble concentrating 0 0 2  Moving slowly or fidgety/restless 0 0 0  Suicidal thoughts 0 0 0  PHQ-9 Score 6 4 12   Difficult doing work/chores Not difficult at all  Very difficult       11/24/2023    3:49 PM 02/25/2022    2:30 PM 07/16/2021    2:14 PM 02/29/2020    2:47 PM  GAD 7 : Generalized Anxiety Score  Nervous, Anxious, on Edge 1 2 2  0  Control/stop worrying 1 3 3  0  Worry too much - different things 0 3 3 0  Trouble relaxing 1 3 3  0  Restless 1 3 3  0  Easily annoyed or irritable 1 3 3  0  Afraid - awful might happen 1 3 3  0  Total GAD 7 Score 6 20 20  0  Anxiety Difficulty  Somewhat difficult Very difficult Not difficult at all    Social History   Tobacco Use  Smoking status: Former    Types: Cigarettes    Start date: 07/26/1985   Smokeless tobacco: Never  Vaping Use   Vaping status: Never Used  Substance Use Topics   Alcohol use: Not Currently    Alcohol/week: 0.0 standard drinks of alcohol   Drug use: No    Review of Systems Per HPI unless specifically indicated above     Objective:    BP 118/62 (BP Location: Left Arm, Patient Position: Sitting, Cuff Size: Normal) Comment: was very faint  Pulse 94   Ht 5' (1.524 m)   Wt 148 lb 6.4 oz (67.3 kg)   SpO2 96%   BMI 28.98 kg/m   Wt Readings from Last 3 Encounters:  06/23/24 148 lb 6.4 oz (67.3 kg)  11/24/23 145 lb (65.8 kg)  07/24/22 151 lb (68.5 kg)    Physical Exam Vitals and nursing note  reviewed.  Constitutional:      General: She is not in acute distress.    Appearance: She is well-developed. She is not diaphoretic.     Comments: Elderly 89 yr female, comfortable, cooperative  HENT:     Head: Normocephalic and atraumatic.  Eyes:     General:        Right eye: No discharge.        Left eye: No discharge.     Comments: Mild conjunctival injection redness but minimal right now. Clear discharge only bilateral. Some edema periorbital below eyes, no erythema  Neck:     Thyroid : No thyromegaly.  Cardiovascular:     Rate and Rhythm: Normal rate and regular rhythm.     Heart sounds: Normal heart sounds. No murmur heard. Pulmonary:     Effort: Pulmonary effort is normal. No respiratory distress.     Breath sounds: Normal breath sounds. No wheezing or rales.  Musculoskeletal:        General: Normal range of motion.     Cervical back: Normal range of motion and neck supple.  Lymphadenopathy:     Cervical: No cervical adenopathy.  Skin:    General: Skin is warm and dry.     Findings: No erythema or rash.  Neurological:     Mental Status: She is alert and oriented to person, place, and time.  Psychiatric:        Behavior: Behavior normal.     Comments: Well groomed, good eye contact, normal speech and thoughts     Results for orders placed or performed in visit on 06/22/20  Hemoglobin A1c   Collection Time: 06/22/20 11:36 AM  Result Value Ref Range   Hgb A1c MFr Bld 5.4 <5.7 % of total Hgb   Mean Plasma Glucose 108 (calc)   eAG (mmol/L) 6.0 (calc)  CBC with Differential/Platelet   Collection Time: 06/22/20 11:36 AM  Result Value Ref Range   WBC 5.2 3.8 - 10.8 Thousand/uL   RBC 4.60 3.80 - 5.10 Million/uL   Hemoglobin 13.4 11.7 - 15.5 g/dL   HCT 58.8 64.9 - 54.9 %   MCV 89.3 80.0 - 100.0 fL   MCH 29.1 27.0 - 33.0 pg   MCHC 32.6 32.0 - 36.0 g/dL   RDW 86.5 88.9 - 84.9 %   Platelets 163 140 - 400 Thousand/uL   MPV 11.3 7.5 - 12.5 fL   Neutro Abs 3,203 1,500 -  7,800 cells/uL   Lymphs Abs 1,451 850 - 3,900 cells/uL   Absolute Monocytes 348 200 - 950 cells/uL   Eosinophils Absolute 140 15 -  500 cells/uL   Basophils Absolute 57 0 - 200 cells/uL   Neutrophils Relative % 61.6 %   Total Lymphocyte 27.9 %   Monocytes Relative 6.7 %   Eosinophils Relative 2.7 %   Basophils Relative 1.1 %  COMPLETE METABOLIC PANEL WITH GFR   Collection Time: 06/22/20 11:36 AM  Result Value Ref Range   Glucose, Bld 95 65 - 99 mg/dL   BUN 20 7 - 25 mg/dL   Creat 8.82 (H) 9.39 - 0.88 mg/dL   GFR, Est Non African American 43 (L) > OR = 60 mL/min/1.32m2   GFR, Est African American 50 (L) > OR = 60 mL/min/1.64m2   BUN/Creatinine Ratio 17 6 - 22 (calc)   Sodium 140 135 - 146 mmol/L   Potassium 4.6 3.5 - 5.3 mmol/L   Chloride 104 98 - 110 mmol/L   CO2 29 20 - 32 mmol/L   Calcium  9.4 8.6 - 10.4 mg/dL   Total Protein 6.8 6.1 - 8.1 g/dL   Albumin 4.6 3.6 - 5.1 g/dL   Globulin 2.2 1.9 - 3.7 g/dL (calc)   AG Ratio 2.1 1.0 - 2.5 (calc)   Total Bilirubin 1.1 0.2 - 1.2 mg/dL   Alkaline phosphatase (APISO) 60 37 - 153 U/L   AST 22 10 - 35 U/L   ALT 14 6 - 29 U/L  Lipid panel   Collection Time: 06/22/20 11:36 AM  Result Value Ref Range   Cholesterol 187 <200 mg/dL   HDL 56 > OR = 50 mg/dL   Triglycerides 871 <849 mg/dL   LDL Cholesterol (Calc) 107 (H) mg/dL (calc)   Total CHOL/HDL Ratio 3.3 <5.0 (calc)   Non-HDL Cholesterol (Calc) 131 (H) <130 mg/dL (calc)  TSH   Collection Time: 06/22/20 11:36 AM  Result Value Ref Range   TSH 0.61 0.40 - 4.50 mIU/L  Vitamin B12   Collection Time: 06/22/20 11:36 AM  Result Value Ref Range   Vitamin B-12 490 200 - 1,100 pg/mL  VITAMIN D 25 Hydroxy (Vit-D Deficiency, Fractures)   Collection Time: 06/22/20 11:36 AM  Result Value Ref Range   Vit D, 25-Hydroxy 27 (L) 30 - 100 ng/mL      Assessment & Plan:   Problem List Items Addressed This Visit     Adaptive colitis   CKD (chronic kidney disease), stage III (HCC)   Mild  cognitive impairment with memory loss - Primary   Mixed incontinence urge and stress   Neuropathy, peripheral   OAB (overactive bladder)   Osteoarthritis of hip   Osteoarthritis of knee   Other Visit Diagnoses       Conjunctivitis of both eyes, unspecified conjunctivitis type       Relevant Medications   trimethoprim -polymyxin b  (POLYTRIM ) ophthalmic solution   Olopatadine  HCl 0.2 % SOLN     Environmental and seasonal allergies       Relevant Medications   cetirizine  (ZYRTEC ) 10 MG tablet        Allergic Conjunctivitis Chronic red, swollen eyes likely due to allergies. Benadryl insufficient for ocular symptoms. Allergic conjunctivitis suspected due to recurrent nature and allergies. Question if underlying bacterial or infectious conjunctivitis - Prescribed olopatadine  eye drops, one drop in each eye once daily. - Prescribed Polytrim  eye drops, four times per day. - Advised switching from Benadryl to Zyrtec  (cetirizine ) to avoid dry eye. - Recommended follow-up with ophthalmologist if symptoms persist. - Warm compresses as needed for relief.  Mild Cognitive Impairment with Memory Loss Early stage dementia managed  with Aricept , reported to be effective. Needs assistance with many ADL and iADL  Step daughter moving in to provide primary caregiver support  Dry Eye / Dry Mouth Dry lips possibly due to oxybutynin  and Benadryl use  Goals of Care Stepdaughter is primary caregiver, assisting with daily activities due to declining health and mobility issues. - Write letter to Investment banker, corporate explaining health issues and caregiver role.        No orders of the defined types were placed in this encounter.   Meds ordered this encounter  Medications   trimethoprim -polymyxin b  (POLYTRIM ) ophthalmic solution    Sig: Place 1 drop into both eyes 4 (four) times daily.    Dispense:  10 mL    Refill:  0   Olopatadine  HCl 0.2 % SOLN    Sig: Apply 1 drop to eye daily.    Dispense:   2.5 mL    Refill:  0   cetirizine  (ZYRTEC ) 10 MG tablet    Sig: Take 1 tablet (10 mg total) by mouth daily.    Follow up plan: Return if symptoms worsen or fail to improve.    Marsa Officer, DO [facility] 06/23/2024, 11:24 AM

## 2024-06-23 NOTE — Patient Instructions (Addendum)
 Thank you for coming to the office today.  Start the eye drops One for allergies Olopatadine  - 1 drop in each eye once daily One for coverage of antibiotic Polytrim  - 4 times per day   Please schedule a Follow-up Appointment to: Return if symptoms worsen or fail to improve.  If you have any other questions or concerns, please feel free to call the office or send a message through MyChart. You may also schedule an earlier appointment if necessary.  Additionally, you may be receiving a survey about your experience at our office within a few days to 1 week by e-mail or mail. We value your feedback.  Marsa Officer, DO [facility], [place]

## 2024-06-27 ENCOUNTER — Ambulatory Visit: Admitting: Internal Medicine

## 2024-06-28 ENCOUNTER — Telehealth: Payer: Self-pay

## 2024-06-28 NOTE — Telephone Encounter (Signed)
 Dr. Edman is out of the office today.  Will try to call pharmacist for clarification and/or await fax communication from the pharmacist.

## 2024-06-28 NOTE — Telephone Encounter (Signed)
 Copied from CRM [id number]. Topic: Compliment - Provider (non-sensitive) >> Jun 28, 2024  3:16 PM [name] wrote:  Details of compliment: The patient would like to thank their PCP for intervening on their living situation and has called to share that things have improved significantly  Who would the patient like to thank/see rewarded? Dr. Edman  On a scale of 1-10, how was your experience? 10  Route to Research officer, political party.

## 2024-06-28 NOTE — Telephone Encounter (Signed)
 Spoke with pharmacist at [facility].  They recommend changing oxybutynin  to myrbetriq  to reduce anticholinergic side effects.  Will defer this to PCP upon his return.

## 2024-06-28 NOTE — Telephone Encounter (Signed)
 Copied from CRM [id number]. Topic: Clinical - Medication Question >> Jun 28, 2024  9:09 AM Essie A wrote: Reason for CRM: Elsie from [facility] is calling about 2 potentially interactive medications, oxydutynin and lomotil .  Needs to speak with someone regarding possible dizziness and memory loss and confusion for the patient.  Please return the call at [phone] to be connected to the pharmacist.  Will be following up with a fax with this information. There will information to respond back if needed.  Thanks.

## 2024-07-01 ENCOUNTER — Telehealth: Payer: Self-pay

## 2024-07-01 DIAGNOSIS — H109 Unspecified conjunctivitis: Secondary | ICD-10-CM

## 2024-07-01 NOTE — Telephone Encounter (Signed)
 Advised patient that Rx was sent to pharmacy. Patient will call pharmacy to check to see if the Rx is there and ready for pickup. Will call back with any further questions or concerns.

## 2024-07-01 NOTE — Telephone Encounter (Signed)
 Copied from CRM [id number]. Topic: Clinical - Prescription Issue >> Jul 01, 2024 11:20 AM Delon DASEN wrote: Reason for CRM: Olopatadine  HCl 0.2 % SOLN- pharmacy never filled it and her eyes are getting worse

## 2024-07-04 NOTE — Telephone Encounter (Signed)
 I signed the form from her insurance/pharmacy, to keep current meds. We attempted Myrbetriq  in 2023 and 2024 and unable to afford due to high cost.  She does not take lomotil  daily.  Will not change rx at this time.  Marsa Officer, DO [facility] 07/04/2024, 12:48 PM

## 2024-07-12 ENCOUNTER — Other Ambulatory Visit: Payer: Self-pay | Admitting: Family Medicine

## 2024-07-12 DIAGNOSIS — K589 Irritable bowel syndrome without diarrhea: Secondary | ICD-10-CM

## 2024-07-12 NOTE — Telephone Encounter (Unsigned)
 Copied from CRM [id number]. Topic: Clinical - Medication Refill >> Jul 12, 2024  2:38 PM Montie POUR wrote: Medication: diphenoxylate -atropine  (LOMOTIL ) 2.5-0.025 MG tablet   Has the patient contacted their pharmacy? Yes (Agent: If no, request that the patient contact the pharmacy for the refill. If patient does not wish to contact the pharmacy document the reason why and proceed with request.) (Agent: If yes, when and what did the pharmacy advise?) Pharmacy needs order to refill  This is the patient's preferred pharmacy:  [facility] [address]), [place] - [address] [place]) [place] [address] Phone: [phone] Fax: [phone]  Is this the correct pharmacy for this prescription? Yes If no, delete pharmacy and type the correct one.   Has the prescription been filled recently? No  Is the patient out of the medication? Yes  Has the patient been seen for an appointment in the last year OR does the patient have an upcoming appointment? Yes  Can we respond through MyChart? No  Agent: Please be advised that Rx refills may take up to 3 business days. We ask that you follow-up with your pharmacy.

## 2024-07-13 ENCOUNTER — Other Ambulatory Visit: Payer: Self-pay | Admitting: Family Medicine

## 2024-07-13 ENCOUNTER — Ambulatory Visit: Payer: Self-pay

## 2024-07-13 DIAGNOSIS — K589 Irritable bowel syndrome without diarrhea: Secondary | ICD-10-CM

## 2024-07-13 MED ORDER — DIPHENOXYLATE-ATROPINE 2.5-0.025 MG PO TABS: ORAL_TABLET | ORAL | 3 refills | Status: DC

## 2024-07-13 NOTE — Telephone Encounter (Signed)
 FYI Only or Action Required?: FYI only for provider.  Patient was last seen in primary care on 06/23/2024 by Edman Marsa PARAS, DO.  Called Nurse Triage reporting Medication Refill.  Symptoms began several days ago.  Symptoms are: gradually worsening.  Triage Disposition: Call PCP Now  Patient/caregiver understands and will follow disposition?: Yes     Copied from CRM [id number]. Topic: Clinical - Red Word Triage >> Jul 13, 2024 12:15 PM Paige House wrote: Red Word that prompted transfer to Nurse Triage:   Patient called in said her diarrhea is getting worse with the heat and she really needs her medicine, asking if it can be rushed     Reason for Disposition  [1] Prescription refill request for ESSENTIAL medicine (i.e., likelihood of harm to patient if not taken) AND [2] triager unable to refill per department policy  Answer Assessment - Initial Assessment Questions 1. DRUG NAME: What medicine do you need to have refilled?     Lomotil  2. REFILLS REMAINING: How many refills are remaining? Notes: The label on the medicine or pill bottle will show how many refills are remaining. If there are no refills remaining, then a renewal may be needed.     0 4. PRESCRIBER: Who prescribed it? Note: The prescribing doctor or group is responsible for refill approvals..     Dr. Edman   5. PHARMACY: Have you contacted your pharmacy (drugstore)? Note: Some pharmacies will contact the doctor (or NP/PA).      [facility] [address] [place], [place], [place] [address] [phone]  Patient no longer uses [facility] home deliver    6. SYMPTOMS: Do you have any symptoms?     Diarrhea  Protocols used: Medication Refill and Renewal Call-A-AH

## 2024-07-13 NOTE — Telephone Encounter (Signed)
 Refill sent to [facility]  Marsa Officer, DO [facility] 07/13/2024, 1:13 PM

## 2024-07-13 NOTE — Telephone Encounter (Signed)
 Medication refill, CRM for nurse triage already sent. Patient would like refill sent to the below pharmacy, stating she no longer uses [facility] home delivery    [facility] [address] [place], [place], [place] [address] [phone]

## 2024-07-21 ENCOUNTER — Other Ambulatory Visit: Payer: Self-pay | Admitting: Family Medicine

## 2024-07-21 DIAGNOSIS — G4700 Insomnia, unspecified: Secondary | ICD-10-CM

## 2024-07-25 NOTE — Telephone Encounter (Signed)
 Requested Prescriptions  Pending Prescriptions Disp Refills   mirtazapine  (REMERON ) 7.5 MG tablet [Pharmacy Med Name: MIRTAZAPINE   7.5MG   TAB] 100 tablet 1    Sig: TAKE 1 TABLET BY MOUTH AT  BEDTIME     Psychiatry: Antidepressants - mirtazapine  Passed - 07/25/2024  5:36 PM      Passed - Completed PHQ-2 or PHQ-9 in the last 360 days      Passed - Valid encounter within last 6 months    Recent Outpatient Visits           1 month ago Mild cognitive impairment with memory loss   [facility], Marsa PARAS, [place]

## 2024-07-31 ENCOUNTER — Other Ambulatory Visit: Payer: Self-pay | Admitting: Family Medicine

## 2024-07-31 DIAGNOSIS — N3281 Overactive bladder: Secondary | ICD-10-CM

## 2024-08-02 NOTE — Telephone Encounter (Signed)
 Requested Prescriptions  Refused Prescriptions Disp Refills   oxybutynin  (DITROPAN ) 5 MG tablet [Pharmacy Med Name: Oxybutynin  Chloride 5 MG Oral Tablet] 180 tablet 5    Sig: TAKE 1 TABLET BY MOUTH EVERY 8  HOURS AS NEEDED FOR BLADDER  SPASMS/OVERACTIVE BLADDER     Urology:  Bladder Agents Passed - 08/02/2024  5:30 PM      Passed - Valid encounter within last 12 months    Recent Outpatient Visits           1 month ago Mild cognitive impairment with memory loss   [facility] [place], Marsa PARAS, [place]

## 2024-08-03 ENCOUNTER — Other Ambulatory Visit: Payer: Self-pay

## 2024-08-03 ENCOUNTER — Telehealth: Payer: Self-pay

## 2024-08-03 DIAGNOSIS — N3281 Overactive bladder: Secondary | ICD-10-CM

## 2024-08-03 MED ORDER — OXYBUTYNIN CHLORIDE 5 MG PO TABS: 5.0000 mg | ORAL_TABLET | Freq: Three times a day (TID) | ORAL | 3 refills | Status: DC | PRN

## 2024-08-03 MED ORDER — OXYBUTYNIN CHLORIDE 5 MG PO TABS
5.0000 mg | ORAL_TABLET | Freq: Three times a day (TID) | ORAL | 3 refills | Status: AC | PRN
Start: 2024-08-03 — End: ?

## 2024-08-03 NOTE — Telephone Encounter (Signed)
 Copied from CRM [id number]. Topic: Clinical - Prescription Issue >> Aug 03, 2024 12:30 PM Pinkey ORN wrote: Patient states she made a mistake and she does in fact want this medication to be sent over to [facility] home delivery

## 2024-08-16 ENCOUNTER — Other Ambulatory Visit: Payer: Self-pay | Admitting: Family Medicine

## 2024-08-16 ENCOUNTER — Ambulatory Visit: Payer: Self-pay

## 2024-08-16 DIAGNOSIS — K589 Irritable bowel syndrome without diarrhea: Secondary | ICD-10-CM

## 2024-08-16 MED ORDER — DIPHENOXYLATE-ATROPINE 2.5-0.025 MG PO TABS: ORAL_TABLET | ORAL | 3 refills | Status: AC

## 2024-08-16 NOTE — Telephone Encounter (Signed)
 LM advising. Call back with any further questions or concerns.

## 2024-08-16 NOTE — Telephone Encounter (Signed)
 Please notify patient. Rx sent to [facility] [address], I added refills on the rx as well.  Marsa Officer, DO [facility] 08/16/2024, 3:56 PM

## 2024-08-16 NOTE — Telephone Encounter (Addendum)
 Attempted to contact pt 3x, no contact made. LVMTCB x3. Routing to clinic.   Summary: Patient stated she is under a lot of stress which is causing her Uncontrolled diarrhea. Callback number is [phone]

## 2024-08-16 NOTE — Telephone Encounter (Signed)
 Rx has been sent in for patient. We left a VM advising. No further action needed at this time.

## 2024-08-16 NOTE — Telephone Encounter (Signed)
 FYI Only or Action Required?: Action required by provider: medication refill request.    diphenoxylate -atropine  (LOMOTIL ) 2.5-0.025 MG tablet   Patient was last seen in primary care on 06/23/2024 by Edman Marsa PARAS, DO.  Called Nurse Triage reporting Diarrhea.  Symptoms began chronic problem.  Symptoms are: worsening due to not having Lomotil .  Triage Disposition: See PCP Within 2 Weeks  Patient/caregiver understands and will follow disposition?: No, wishes to speak with PCP       Copied from CRM #[id number]. Topic: Clinical - Pink Word Triage >> Aug 16, 2024  2:26 PM Tysheama G wrote: Reason for Triage: Patient stated she is under a lot of stress which is causing her Uncontrolled diarrhea. Callback number is [phone] >> Aug 16, 2024  3:41 PM Ivette P wrote: Pt called in due to not getting call. Called nurse triage.  >> Aug 16, 2024  2:27 PM Terri G wrote: Patient stated she is under a lot of stress which is causing her Uncontrolled diarrhea. Callback number is [phone]        Reason for Disposition  Diarrhea is a chronic symptom (recurrent or ongoing AND present > 4 weeks)  Answer Assessment - Initial Assessment Questions Patient is requesting a refill for diphenoxylate -atropine  (LOMOTIL ) 2.5-0.025 MG tablet, she reports that there are no refills available and states she is unable to come in for an appointment due to her symptoms. Please advise.         1. DIARRHEA SEVERITY: How bad is the diarrhea? How many more stools have you had in the past 24 hours than normal?      5 episodes  2. ONSET: When did the diarrhea begin?      Ongoing problem with the patient  3. STOOL DESCRIPTION:  How loose or watery is the diarrhea? What is the stool color? Is there any blood or mucous in the stool?     Watery  4. VOMITING: Are you also vomiting? If Yes, ask: How many times in the past 24 hours?      No 5. ABDOMEN PAIN: Are you having any abdomen  pain? If Yes, ask: What does it feel like? (e.g., crampy, dull, intermittent, constant)      No 6. ABDOMEN PAIN SEVERITY: If present, ask: How bad is the pain?  (e.g., Scale 1-10; mild, moderate, or severe)     0/10 7. ORAL INTAKE: If vomiting, Have you been able to drink liquids? How much liquids have you had in the past 24 hours?     N/A 8. HYDRATION: Any signs of dehydration? (e.g., dry mouth [not just dry lips], too weak to stand, dizziness, new weight loss) When did you last urinate?     No 9. EXPOSURE: Have you traveled to a foreign country recently? Have you been exposed to anyone with diarrhea? Could you have eaten any food that was spoiled?     No 10. ANTIBIOTIC USE: Are you taking antibiotics now or have you taken antibiotics in the past 2 months?       No 11. OTHER SYMPTOMS: Do you have any other symptoms? (e.g., fever, blood in stool)       No  Protocols used: Diarrhea-A-AH

## 2024-08-19 ENCOUNTER — Other Ambulatory Visit: Payer: Self-pay | Admitting: Internal Medicine

## 2024-08-19 DIAGNOSIS — F50819 Binge eating disorder, unspecified: Secondary | ICD-10-CM

## 2024-08-19 NOTE — Telephone Encounter (Signed)
 Requested Prescriptions  Pending Prescriptions Disp Refills   FLUoxetine  (PROZAC ) 20 MG capsule [Pharmacy Med Name: FLUoxetine  HCl 20 MG Oral Capsule] 90 capsule 0    Sig: TAKE 1 CAPSULE BY MOUTH ONCE  DAILY     Psychiatry:  Antidepressants - SSRI Passed - 08/19/2024 11:46 AM      Passed - Completed PHQ-2 or PHQ-9 in the last 360 days      Passed - Valid encounter within last 6 months    Recent Outpatient Visits           1 month ago Mild cognitive impairment with memory loss   [facility] [place], Marsa PARAS, [place]

## 2024-08-30 ENCOUNTER — Other Ambulatory Visit: Payer: Self-pay | Admitting: Family Medicine

## 2024-08-30 DIAGNOSIS — R413 Other amnesia: Secondary | ICD-10-CM

## 2024-09-01 NOTE — Telephone Encounter (Signed)
 Too soon for refill, LRF 01/12/24 for 90 and 3 RF.  Requested Prescriptions  Pending Prescriptions Disp Refills   donepezil  (ARICEPT ) 5 MG tablet [Pharmacy Med Name: Donepezil  HCl 5 MG Oral Tablet] 100 tablet 2    Sig: TAKE 1 TABLET BY MOUTH AT  BEDTIME     Neurology:  Alzheimer's Agents Passed - 09/01/2024 10:53 AM      Passed - Valid encounter within last 6 months    Recent Outpatient Visits           2 months ago Mild cognitive impairment with memory loss   [facility] [place], Marsa PARAS, [place]

## 2024-11-04 ENCOUNTER — Other Ambulatory Visit: Payer: Self-pay | Admitting: Family Medicine

## 2024-11-04 DIAGNOSIS — K589 Irritable bowel syndrome without diarrhea: Secondary | ICD-10-CM

## 2024-11-04 NOTE — Telephone Encounter (Unsigned)
 Copied from CRM [id number]. Topic: Clinical - Medication Refill >> Nov 04, 2024  3:51 PM Tiffini S wrote: Medication: diphenoxylate -atropine  (LOMOTIL ) 2.5-0.025 MG tablet- asking for more than 3 refills   Has the patient contacted their pharmacy? Yes (Agent: If no, request that the patient contact the pharmacy for the refill. If patient does not wish to contact the pharmacy document the reason why and proceed with request.) (Agent: If yes, when and what did the pharmacy advise?)  This is the patient's preferred pharmacy:  [facility] [address]), [place] - [address] [place]ACO[place]) [place] [address] Phone: [phone] Fax: [phone]    Is this the correct pharmacy for this prescription? Yes If no, delete pharmacy and type the correct one.   Has the prescription been filled recently? Yes  Is the patient out of the medication? Yes, have three tablets left   Has the patient been seen for an appointment in the last year OR does the patient have an upcoming appointment? Yes  Can we respond through MyChart? No, please call the patient at [phone]  Agent: Please be advised that Rx refills may take up to 3 business days. We ask that you follow-up with your pharmacy.

## 2024-11-05 NOTE — Telephone Encounter (Signed)
 Requested medication (s) are due for refill today: Yes  Requested medication (s) are on the active medication list: Yes  Last refill:  08/16/24  Future visit scheduled: No  Notes to clinic:  Unable to refill per protocol, cannot delegate.      Requested Prescriptions  Pending Prescriptions Disp Refills   diphenoxylate -atropine  (LOMOTIL ) 2.5-0.025 MG tablet 60 tablet 3    Sig: TAKE 1 OR 2 TABLETS BY MOUTH 4 TIMES DAILY AS NEEDED FOR DIARRHEA     Not Delegated - Gastroenterology:  Antidiarrheals Failed - 11/05/2024 11:27 AM      Failed - This refill cannot be delegated      Passed - Valid encounter within last 12 months    Recent Outpatient Visits           4 months ago Mild cognitive impairment with memory loss   [facility], Marsa PARAS, [place]

## 2024-11-07 ENCOUNTER — Telehealth: Payer: Self-pay

## 2024-11-07 NOTE — Telephone Encounter (Signed)
 Attempted to call patient, no answer or vm. I reviewed her chart there are no recent missed calls.

## 2024-11-07 NOTE — Telephone Encounter (Signed)
 Copied from CRM #[id number]. Topic: General - Call Back - No Documentation >> Nov 07, 2024  3:10 PM Yolanda T wrote: Patient call back to say her medication ready and to thank Dr MARLA and staff for helping her get what she needed

## 2024-11-07 NOTE — Telephone Encounter (Signed)
 Copied from CRM #[id number]. Topic: General - Call Back - No Documentation >> Nov 07, 2024  2:19 PM Yolanda T wrote: Reason for CRM: no documentation...patient called stated she was returning a call. Please f/u with patient

## 2024-12-20 ENCOUNTER — Telehealth: Payer: Self-pay

## 2024-12-20 ENCOUNTER — Ambulatory Visit: Payer: Self-pay

## 2024-12-20 DIAGNOSIS — L89301 Pressure ulcer of unspecified buttock, stage 1: Secondary | ICD-10-CM

## 2024-12-20 NOTE — Telephone Encounter (Signed)
 See new telephone note in response to this today. Copied below from our clinical staff.  Left message for patient to return call. Please find out additional information if patient returns call    This is copied from a secure chat with Dr Edman   In the past she was on rx topical medications for pressure sores. I believe this is what she is asking but it is unclear from the triage note.   The only rx medicated topicals for helping wound heal are:  Mupirocin  (topical antibiotic) ointment  Silvadene  cream  AK I usually recommend Mupirocin  ointment. If it is a skin sore.  I would need to know if she needs it sent to [facility] for mail order like she usually requests or is she needing to pick it up ASAP at a local pharmacy?    Marsa Edman, DO [facility] 12/20/2024, 4:51 PM

## 2024-12-20 NOTE — Telephone Encounter (Addendum)
 Left message for patient to return call. Please find out additional information if patient returns call   This is copied from a secure chat with Dr Edman     In the past she was on rx topical medications for pressure sores. I believe this is what she is asking but it is unclear from the triage note.   The only rx medicated topicals for helping wound heal are:  Mupirocin  (topical antibiotic) ointment  Silvadene  cream  AK I usually recommend Mupirocin  ointment. If it is a skin sore.  I would need to know if she needs it sent to [facility] for mail order like she usually requests or is she needing to pick it up ASAP at a local pharmacy?

## 2024-12-20 NOTE — Telephone Encounter (Signed)
 FYI Only or Action Required?: Action required by provider: pt states that pcp is familiar with her pressure ulcers, pt states that she usually has an rx sent in for her wounds. Pt states that she is unable to come to the clinic. Please call pt back with next steps. Pt states if she does not answer to leave a VM.  Patient was last seen in primary care on 06/23/2024 by Edman Marsa PARAS, DO.  Called Nurse Triage reporting Wound Infection.  Symptoms began several days ago.  Interventions attempted: Rest, hydration, or home remedies.  Symptoms are: gradually worsening.  Triage Disposition: See Physician Within 24 Hours  Patient/caregiver understands and will follow disposition?: No, wishes to speak with PCP  Copied from CRM [id number]. Topic: Clinical - Red Word Triage >> Dec 20, 2024  3:48 PM Darshell M wrote: Red Word that prompted transfer to Nurse Triage: Open pressure sore that has worsened. Pain and soreness. No drainage.    - Reason for Disposition  [1] Wound > 48 hours old AND [2] it becomes more tender  Answer Assessment - Initial Assessment Questions 1. LOCATION: Where is the wound located?      buttocks 2. WOUND APPEARANCE: What does the wound look like?      Open, crusty,  3. SIZE: If redness is present, ask: What is the size of the red area? (Inches, centimeters, or compare to size of a coin)      Small per pt, thumb size 4. SPREAD: What's changed in the last day?  Do you see any red streaks coming from the wound?     Denies spreading redness, but states there is redness to the center of the wound 5. ONSET: When did it start to look infected?      5th day 6. MECHANISM: How did the wound start, what was the cause?     Sitting to often to read 7. PAIN: Is there any pain? If Yes, ask: How bad is the pain?   (Scale 1-10; or mild, moderate, severe)     3 8. FEVER: Do you have a fever? If Yes, ask: What is your temperature, how was it measured, and  when did it start?     denies 9. OTHER SYMPTOMS: Do you have any other symptoms? (e.g., shaking chills, weakness, rash elsewhere on body)     denies  Protocols used: Wound Infection-A-AH

## 2024-12-21 MED ORDER — MUPIROCIN 2 % EX OINT: 1.0000 | TOPICAL_OINTMENT | Freq: Two times a day (BID) | CUTANEOUS | 3 refills | Status: AC | PRN

## 2024-12-21 NOTE — Addendum Note (Signed)
 Addended by: EDMAN MARSA PARAS on: 12/21/2024 01:19 PM   Modules accepted: Orders

## 2024-12-21 NOTE — Telephone Encounter (Signed)
 Rx sent Mupirocin  ointment to [facility]  Marsa Officer, DO [facility] 12/21/2024, 1:19 PM

## 2025-01-02 ENCOUNTER — Other Ambulatory Visit: Payer: Self-pay | Admitting: Family Medicine

## 2025-01-02 DIAGNOSIS — G4700 Insomnia, unspecified: Secondary | ICD-10-CM

## 2025-01-03 NOTE — Telephone Encounter (Signed)
 Requested Prescriptions  Pending Prescriptions Disp Refills   mirtazapine  (REMERON ) 7.5 MG tablet [Pharmacy Med Name: MIRTAZAPINE   7.5MG   TAB] 100 tablet 0    Sig: TAKE 1 TABLET BY MOUTH AT  BEDTIME     Psychiatry: Antidepressants - mirtazapine  Failed - 01/03/2025  2:03 PM      Failed - Valid encounter within last 6 months    Recent Outpatient Visits           6 months ago Mild cognitive impairment with memory loss   [facility] [place], Marsa PARAS, DO              Passed - Completed PHQ-2 or PHQ-9 in the last 360 days

## 2025-02-08 ENCOUNTER — Encounter
